# Patient Record
Sex: Female | Born: 1990 | Race: Black or African American | Hispanic: No | Marital: Single | Smoking: Never smoker
Health system: Southern US, Community
[De-identification: ages and names within clinical notes are randomized; demographics above are authoritative.]

## PROBLEM LIST (undated history)

## (undated) DIAGNOSIS — F200 Paranoid schizophrenia: Secondary | ICD-10-CM

## (undated) DIAGNOSIS — F79 Unspecified intellectual disabilities: Secondary | ICD-10-CM

## (undated) DIAGNOSIS — R6250 Unspecified lack of expected normal physiological development in childhood: Secondary | ICD-10-CM

## (undated) DIAGNOSIS — R569 Unspecified convulsions: Secondary | ICD-10-CM

## (undated) HISTORY — DX: Unspecified lack of expected normal physiological development in childhood: R62.50

## (undated) HISTORY — DX: Unspecified intellectual disabilities: F79

## (undated) HISTORY — DX: Unspecified convulsions: R56.9

## (undated) HISTORY — DX: Paranoid schizophrenia: F20.0

---
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
## 2010-01-12 ENCOUNTER — Encounter: Admission: RE | Admit: 2010-01-12 | Discharge: 2010-04-12 | Payer: Self-pay | Admitting: *Deleted

## 2010-01-25 ENCOUNTER — Ambulatory Visit: Payer: Self-pay | Admitting: Family Medicine

## 2010-02-14 ENCOUNTER — Emergency Department: Payer: Self-pay | Admitting: Emergency Medicine

## 2010-02-17 ENCOUNTER — Emergency Department: Payer: Self-pay | Admitting: Emergency Medicine

## 2010-03-16 ENCOUNTER — Encounter: Payer: Self-pay | Admitting: Gastroenterology

## 2010-08-30 NOTE — Letter (Signed)
Summary: New Patient letter  [facility]  [address] [place], [place] [address]   Phone: [phone]  Fax: [phone]       03/16/2010 MRN: [id number]  Hannah Keller [address] [place], [place]  [address]  Dear Ms. Faraone,  Welcome to the Gastroenterology Division at [facility].    You are scheduled to see Dr.  Christella Hartigan on 03-29-10 at 2pm on the 3rd floor at [facility], [address]  We ask that you try to arrive at our office 15 minutes prior to your appointment time to allow for check-in.  We would like you to complete the enclosed self-administered evaluation form prior to your visit and bring it with you on the day of your appointment.  We will review it with you.  Also, please bring a complete list of all your medications or, if you prefer, bring the medication bottles and we will list them.  Please bring your insurance card so that we may make a copy of it.  If your insurance requires a referral to see a specialist, please bring your referral form from your primary care physician.  Co-payments are due at the time of your visit and may be paid by cash, check or credit card.     Your office visit will consist of a consult with your physician (includes a physical exam), any laboratory testing he/she may order, scheduling of any necessary diagnostic testing (e.g. x-ray, ultrasound, CT-scan), and scheduling of a procedure (e.g. Endoscopy, Colonoscopy) if required.  Please allow enough time on your schedule to allow for any/all of these possibilities.    If you cannot keep your appointment, please call [phone] to cancel or reschedule prior to your appointment date.  This allows [place] the opportunity to schedule an appointment for another patient in need of care.  If you do not cancel or reschedule by 5 p.m. the business day prior to your appointment date, you will be charged a $50.00 late cancellation/no-show fee.    Thank you for choosing [facility] for your medical needs.  We appreciate the opportunity to care for you.  Please visit [place] at our website  to learn more about our practice.                     Sincerely,                                                             The Gastroenterology Division

## 2011-11-27 ENCOUNTER — Emergency Department: Payer: Self-pay | Admitting: *Deleted

## 2012-02-23 ENCOUNTER — Emergency Department: Payer: Self-pay | Admitting: Emergency Medicine

## 2012-02-24 LAB — PREGNANCY, URINE: Pregnancy Test, Urine: NEGATIVE m[IU]/mL

## 2012-02-24 LAB — ETHANOL: Ethanol: <3 mg/dL

## 2012-02-24 LAB — CBC
HCT: 38.5 % (ref 35.0–47.0)
HGB: 13 g/dL (ref 12.0–16.0)
MCH: 30.3 pg (ref 26.0–34.0)
MCHC: 33.8 g/dL (ref 32.0–36.0)
MCV: 90 fL (ref 80–100)
Platelet: 242 10*3/uL (ref 150–440)
RDW: 12.6 % (ref 11.5–14.5)

## 2012-02-24 LAB — URINALYSIS, COMPLETE
Bilirubin,UR: NEGATIVE
Ketone: NEGATIVE
Ph: 7 (ref 4.5–8.0)
Protein: NEGATIVE
RBC,UR: NONE SEEN /HPF (ref 0–5)
Specific Gravity: 1.021 (ref 1.003–1.030)
Squamous Epithelial: 8
WBC UR: 14 /HPF (ref 0–5)

## 2012-02-24 LAB — COMPREHENSIVE METABOLIC PANEL
Albumin: 3.4 g/dL (ref 3.4–5.0)
Alkaline Phosphatase: 63 U/L (ref 50–136)
Anion Gap: 7 (ref 7–16)
Bilirubin,Total: 0.2 mg/dL (ref 0.2–1.0)
Calcium, Total: 9.3 mg/dL (ref 8.5–10.1)
Chloride: 108 mmol/L — ABNORMAL HIGH (ref 98–107)
Co2: 27 mmol/L (ref 21–32)
EGFR (African American): >60
EGFR (Non-African Amer.): >60
Glucose: 88 mg/dL (ref 65–99)
Osmolality: 284 (ref 275–301)
Potassium: 4.1 mmol/L (ref 3.5–5.1)
Sodium: 142 mmol/L (ref 136–145)
Total Protein: 7.6 g/dL (ref 6.4–8.2)

## 2012-02-24 LAB — TSH
Thyroid Stimulating Horm: 1.6 u[IU]/mL
Thyroid Stimulating Horm: 6.29 u[IU]/mL — ABNORMAL HIGH

## 2012-02-24 LAB — DRUG SCREEN, URINE
Amphetamines, Ur Screen: POSITIVE (ref ?–1000)
Benzodiazepine, Ur Scrn: NEGATIVE (ref ?–200)
Cocaine Metabolite,Ur ~~LOC~~: NEGATIVE (ref ?–300)
Methadone, Ur Screen: NEGATIVE (ref ?–300)
Phencyclidine (PCP) Ur S: NEGATIVE (ref ?–25)

## 2012-02-24 LAB — LITHIUM LEVEL: Lithium: 0.71 mmol/L

## 2012-03-23 ENCOUNTER — Emergency Department: Payer: Self-pay | Admitting: Emergency Medicine

## 2012-03-23 LAB — COMPREHENSIVE METABOLIC PANEL
Alkaline Phosphatase: 64 U/L (ref 50–136)
Anion Gap: 6 — ABNORMAL LOW (ref 7–16)
Bilirubin,Total: 0.1 mg/dL — ABNORMAL LOW (ref 0.2–1.0)
Calcium, Total: 9.1 mg/dL (ref 8.5–10.1)
Chloride: 107 mmol/L (ref 98–107)
Co2: 25 mmol/L (ref 21–32)
Creatinine: 1.16 mg/dL (ref 0.60–1.30)
EGFR (African American): >60
EGFR (Non-African Amer.): >60
Osmolality: 278 (ref 275–301)
Potassium: 4.3 mmol/L (ref 3.5–5.1)
Total Protein: 7.6 g/dL (ref 6.4–8.2)

## 2012-03-23 LAB — LITHIUM LEVEL: Lithium: 0.88 mmol/L

## 2012-03-23 LAB — URINALYSIS, COMPLETE
Bilirubin,UR: NEGATIVE
Blood: NEGATIVE
Glucose,UR: NEGATIVE mg/dL (ref 0–75)
Leukocyte Esterase: NEGATIVE
Nitrite: NEGATIVE
RBC,UR: NONE SEEN /HPF (ref 0–5)
Squamous Epithelial: 3
WBC UR: 2 /HPF (ref 0–5)

## 2012-03-23 LAB — DRUG SCREEN, URINE
Barbiturates, Ur Screen: NEGATIVE (ref ?–200)
Benzodiazepine, Ur Scrn: NEGATIVE (ref ?–200)
Cannabinoid 50 Ng, Ur ~~LOC~~: NEGATIVE (ref ?–50)
MDMA (Ecstasy)Ur Screen: NEGATIVE (ref ?–500)
Phencyclidine (PCP) Ur S: NEGATIVE (ref ?–25)

## 2012-03-23 LAB — CBC
HCT: 38.7 % (ref 35.0–47.0)
HGB: 12.9 g/dL (ref 12.0–16.0)
MCH: 30.4 pg (ref 26.0–34.0)
MCV: 91 fL (ref 80–100)
RDW: 13 % (ref 11.5–14.5)
WBC: 10.7 10*3/uL (ref 3.6–11.0)

## 2012-03-23 LAB — PREGNANCY, URINE: Pregnancy Test, Urine: NEGATIVE m[IU]/mL

## 2012-03-23 LAB — ETHANOL
Ethanol %: <0.003 % (ref 0.000–0.080)
Ethanol: <3 mg/dL

## 2012-03-23 LAB — TSH: Thyroid Stimulating Horm: 2.09 u[IU]/mL

## 2012-12-09 ENCOUNTER — Ambulatory Visit (INDEPENDENT_AMBULATORY_CARE_PROVIDER_SITE_OTHER): Payer: Medicaid Other | Admitting: Diagnostic Neuroimaging

## 2012-12-09 ENCOUNTER — Encounter: Payer: Self-pay | Admitting: Diagnostic Neuroimaging

## 2012-12-09 VITALS — BP 108/62 | HR 98 | Temp 98.5°F | Ht <= 58 in | Wt 162.0 lb

## 2012-12-09 DIAGNOSIS — IMO0002 Reserved for concepts with insufficient information to code with codable children: Secondary | ICD-10-CM

## 2012-12-09 DIAGNOSIS — R451 Restlessness and agitation: Secondary | ICD-10-CM

## 2012-12-09 NOTE — Patient Instructions (Addendum)
We will order MRI of the brain and an EEG and send recommendations for lab tests to Primary Provider.

## 2012-12-09 NOTE — Progress Notes (Signed)
[facility]  PATIENT: Hannah Keller DOB: 02-Jun-1991   HISTORY FROM: caregivers, patient REASON FOR VISIT: increased behavioral problems   HISTORICAL  CHIEF COMPLAINT:  Chief Complaint  Patient presents with  . Neurologic Problem    severe behavior changes    HISTORY OF PRESENT ILLNESS:    UPDATE 12/09/12: 22 year old female with history of moderate mental retardation, bipolar disorder, paranoid schizophrenia and seizure disorder comes in today accompanied by 2 caregivers with reports of severe behavioral changes and aggression .  Caregivers report severe behavioral problems which have caused her to be recently suspended from school.  She has had medication changes for increased aggression by psychiatrist (Dr. Omelia Blackwater) but caregivers report it is not helping.  Caregivers want to know if there is a neurologic reason why she is having these behavioral episodes.  PRIOR HPI (02/22/10): 21 year old right-handed female with history of moderate mental retardation, static encephalopathy, bipolar disorder presenting for evaluation of decreased p.o. intake and chewing/swallowing difficulties.  She is accompanied by 2 caregivers for this visit.  Patient currently lives in a group home and was doing well until approximately one year ago. She previously had a strong appetite and was slightly overweight. She previously weight 144 pounds; now she weighs 93 pounds.  Weight loss attributed mainly to refusal to eat and decreased p.o. intake. In November 2010, school officials reported that she was pocketing food in her mouth, not chewing and swallowing her food properly.  She has been evaluated by speech therapy and had recent modified barium swallow study which did not demonstrate any pharyngeal dysphasia or aspiration.  The caregivers are concerned that this is mainly related to behavioral issues. They suspect attention seeking behavior.  One of the caregivers commented "it seems as  though she is trying to starve herself".   REVIEW OF SYSTEMS: Full 14 system review of systems performed and notable only for weight loss anxiety hallucination racing thoughts.  ALLERGIES: Not on File  HOME MEDICATIONS: No outpatient prescriptions prior to visit.   No facility-administered medications prior to visit.    PAST MEDICAL HISTORY: Past Medical History  Diagnosis Date  . Mental retardation     Mild  . Paranoid schizophrenia     PAST SURGICAL HISTORY: History reviewed. No pertinent past surgical history.  FAMILY HISTORY: History reviewed. No pertinent family history.  SOCIAL HISTORY:  History   Social History  . Marital Status: Single    Spouse Name: N/A    Number of Children: 0  . Years of Education: n/a   Occupational History  . Disabled    Social History Main Topics  . Smoking status: Never Smoker   . Smokeless tobacco: Never Used  . Alcohol Use: No  . Drug Use: No  . Sexually Active: Not on file   Other Topics Concern  . Not on file   Social History Narrative   Pt currently resides at [facility].   Caffeine Use: None     PHYSICAL EXAM   Filed Vitals:   12/09/12 1111  BP: 108/62  Pulse: 98  Temp: 98.5 F (36.9 C)  Height: 4\' 9"  (1.448 m)  Weight: 162 lb (73.483 kg)   Body mass index is 35.05 kg/(m^2).  EXAM: General: Patient is awake, alert and in no acute distress.  HEAD DOWN; AVOIDING EYE CONTACT; SHY; WITHDRAWN. Head: HEAD TURNED TO THE RIGHT AND DOWN.  ABLE TO STRAIGHTEN UPRIGHT AT VERBAL REQUEST OF CAREGIVERS. Neck: INCREASED TONE TO THE  RIGHT. Cardiovascular: Heart is regular rate and rhythm with no murmurs.  Neurologic Exam  Mental Status: Awake, alert; SPEAKS IN SOFT VOICE; ANSWERS IN SHORT PHRASES OR 1 WORD WORD ANSWERS.  PAUCITY OF SPEECH. Cranial Nerves: Pupils are equal and reactive to light.  SUSTAINED NYSTAGMUS, WORSE ON RIGHT GAZE, BUT PRESENT IN ALL DIRECTIONS.  Facial sensation and strength are  symmetric.  Hearing is intact.  Palate elevated symmetrically and uvula is midline.  Shoulder shug is symmetric.  Tongue is midline; SLOW TONGUE PROTRUSION. Motor: INCREASED TONE IN RIGHT COMPARED TO LEFT SIDE. Sensory: Intact and symmetric to light touch. Coordination: TRUNCAL ATAXIA; LEANING TO RIGHT.  SLOW MOVEMENTS WITH ARMS AND LEGS. Gait and Station: ABLE TO STAND ON HER OWN AND WALK; SLOW; LEANS TO THE RIGHT. Reflexes: Deep tendon reflexes in the upper and lower extremity are present; INCREASED ON RIGHT SIDE (RIGHT ARM 3-4+); SUSTAINED CLONUS ON RIGHT ANKLE.   DIAGNOSTIC DATA (LABS, IMAGING, TESTING) - I reviewed patient records, labs, notes, testing and imaging myself where available.  No results found for this basename: WBC,  HGB,  HCT,  MCV,  PLT   No results found for this basename: na,  k,  cl,  co2,  glucose,  bun,  creatinine,  calcium,  prot,  albumin,  ast,  alt,  alkphos,  bilitot,  gfrnonaa,  gfraa   No results found for this basename: CHOL,  HDL,  LDLCALC,  LDLDIRECT,  TRIG,  CHOLHDL   No results found for this basename: HGBA1C   No results found for this basename: VITAMINB12   No results found for this basename: TSH     ASSESSMENT AND PLAN  22 y.o. year old female  has a past medical history of Mental retardation and Paranoid schizophrenia. here with behavioral changes.   PLAN: 1. I will check MRI, EEG.  2. Ask PCP to check B12, TSH, depakote level, lithium level and ammonia level.   Suanne Marker, MD(with LYNN LAM NP-C 12/09/2012, 11:20 AM) Certified in Neurology, Neurophysiology and Neuroimaging  [facility] [address] [place], [place] [address] [phone]

## 2012-12-12 ENCOUNTER — Ambulatory Visit (INDEPENDENT_AMBULATORY_CARE_PROVIDER_SITE_OTHER): Payer: Medicaid Other | Admitting: Radiology

## 2012-12-12 DIAGNOSIS — IMO0002 Reserved for concepts with insufficient information to code with codable children: Secondary | ICD-10-CM

## 2012-12-12 DIAGNOSIS — R451 Restlessness and agitation: Secondary | ICD-10-CM

## 2012-12-16 NOTE — Procedures (Signed)
   [facility]  EEG (ELECTROENCEPHALOGRAM) REPORT   STUDY DATE: 12/12/12 PATIENT NAME: Hannah Keller DOB: [birth year] MRN: [id number]  ORDERING CLINICIAN: Joycelyn Schmid, MD   TECHNOLOGIST: Kaylyn Lim TECHNIQUE: Electroencephalogram was recorded utilizing standard 10-20 system of lead placement and reformatted into average and bipolar montages.  RECORDING TIME: 20 minutes ACTIVATION: photic stimulation  CLINICAL INFORMATION: 22 year old female with behavioral changes. Mental retardation. Schizophrenia.  FINDINGS: Background rhythms of 9-10 hertz and 30-40 microvolts. There is intermittent, frontal, rhythmic delta activity (FIRDA). No focal, lateralizing, epileptiform activity or seizures are seen. Patient recorded in the awake state.    IMPRESSION:  Abnormal EEG in the awake state demonstrating: 1. There is intermittent, frontal, rhythmic delta activity (FIRDA). Can be due to toxic, metabolic or structural abnormalities. This finding is not necessarily epileptiform in nature. 2. No electrographic seizures.   INTERPRETING PHYSICIAN:  Suanne Marker, MD Certified in Neurology, Neurophysiology and Neuroimaging  [facility] [address] [place], [place] [address] [phone]

## 2012-12-27 ENCOUNTER — Inpatient Hospital Stay: Admission: RE | Admit: 2012-12-27 | Payer: Self-pay | Source: Ambulatory Visit

## 2012-12-31 ENCOUNTER — Ambulatory Visit
Admission: RE | Admit: 2012-12-31 | Discharge: 2012-12-31 | Disposition: A | Discharge status: Home / Self Care | Payer: Medicaid Other | Source: Ambulatory Visit | Attending: Nurse Practitioner | Admitting: Nurse Practitioner

## 2012-12-31 DIAGNOSIS — R451 Restlessness and agitation: Secondary | ICD-10-CM

## 2013-01-01 DIAGNOSIS — IMO0002 Reserved for concepts with insufficient information to code with codable children: Secondary | ICD-10-CM

## 2013-01-06 NOTE — Progress Notes (Signed)
Quick Note:  Called and spoke to caregiver normal MRI results. ______

## 2014-06-17 ENCOUNTER — Encounter (HOSPITAL_COMMUNITY): Payer: Self-pay | Admitting: *Deleted

## 2014-06-17 ENCOUNTER — Emergency Department (HOSPITAL_COMMUNITY)
Admission: EM | Admit: 2014-06-17 | Discharge: 2014-06-17 | Disposition: A | Discharge status: Home / Self Care | Payer: Medicaid Other | Attending: Emergency Medicine | Admitting: Emergency Medicine

## 2014-06-17 DIAGNOSIS — W228XXA Striking against or struck by other objects, initial encounter: Secondary | ICD-10-CM | POA: Diagnosis not present

## 2014-06-17 DIAGNOSIS — Y9389 Activity, other specified: Secondary | ICD-10-CM | POA: Insufficient documentation

## 2014-06-17 DIAGNOSIS — F200 Paranoid schizophrenia: Secondary | ICD-10-CM | POA: Diagnosis not present

## 2014-06-17 DIAGNOSIS — Y998 Other external cause status: Secondary | ICD-10-CM | POA: Insufficient documentation

## 2014-06-17 DIAGNOSIS — Y92210 Daycare center as the place of occurrence of the external cause: Secondary | ICD-10-CM | POA: Insufficient documentation

## 2014-06-17 DIAGNOSIS — S61212A Laceration without foreign body of right middle finger without damage to nail, initial encounter: Secondary | ICD-10-CM | POA: Diagnosis not present

## 2014-06-17 DIAGNOSIS — Z79899 Other long term (current) drug therapy: Secondary | ICD-10-CM | POA: Insufficient documentation

## 2014-06-17 DIAGNOSIS — IMO0002 Reserved for concepts with insufficient information to code with codable children: Secondary | ICD-10-CM

## 2014-06-17 DIAGNOSIS — G40909 Epilepsy, unspecified, not intractable, without status epilepticus: Secondary | ICD-10-CM | POA: Diagnosis not present

## 2014-06-17 MED ORDER — LIDOCAINE HCL (PF) 1 % IJ SOLN
5.0000 mL | Freq: Once | INTRAMUSCULAR | Status: DC
  Filled 2014-06-17: qty 5

## 2014-06-17 MED ORDER — ACETAMINOPHEN 325 MG PO TABS: 325.0000 mg | ORAL_TABLET | Freq: Four times a day (QID) | ORAL | Status: DC | PRN

## 2014-06-17 NOTE — ED Notes (Signed)
Called pt's caregiver at number in pt's chart, no answer, left voice mail. PA updated

## 2014-06-17 NOTE — ED Notes (Signed)
Per EMS [facility] has hx of behavioral disorder and MR, she got mad at somebody and punched metal blinds injuring right hand (middle finger laceration), per EMS bleeding was controlled with bandaid prior to their arrival.

## 2014-06-17 NOTE — Progress Notes (Signed)
  CARE MANAGEMENT ED NOTE 06/17/2014  Patient:  Hannah Keller,Hannah Keller   Account Number:  [id number]  Date Initiated:  06/17/2014  Documentation initiated by:  Radford PaxFERRERO,Vikram Tillett  Subjective/Objective Assessment:   Patient presents to Ed with finger laceratoin.     Subjective/Objective Assessment Detail:   [facility]. Patient with behaviorl disorder, MR     Action/Plan:   Action/Plan Detail:   Anticipated DC Date:  06/17/2014     Status Recommendation to Physician:   Result of Recommendation:    Other ED Services  Consult Working Plan    DC Planning Services  Other  PCP issues    Choice offered to / List presented to:            Status of service:  Completed, signed off  ED Comments:   ED Comments Detail:  Patient listed as having [employer]  Access insurnace.  PCP listed on patient's card [facility].  System updated.

## 2014-06-17 NOTE — Discharge Instructions (Signed)
A laceration is a cut or lesion that goes through all layers of the skin and into the tissue just beneath the skin. This may have been repaired by your caregiver.  SEEK MEDICAL ATTENTION IF: There is redness, swelling, increasing pain in the wound  There is a red line that goes up your arm or leg.  Pus is coming from wound.  You develop an unexplained temperature above 100.4.  You notice a foul smell coming from the wound or dressing.  There is a breaking open of the wound (edges not staying together) after sutures have been removed. If you did not receive a tetanus shot today because you thought you were up to date, but did not recall when your last one was given, make sure to check with your primary caregiver to determine if you need one.  WOUND CARE Please have your stitches/staples removed in 7 days or sooner if you have concerns. You may do this at any available urgent care or at your primary care doctor's office.  Keep area clean and dry for 24 hours. Do not remove bandage, if applied.  After 24 hours, remove bandage and wash wound gently with mild soap and warm water. Reapply a new bandage after cleaning wound, if directed.  Continue daily cleansing with soap and water until stitches/staples are removed.  Do not apply any ointments or creams to the wound while stitches/staples are in place, as this may cause delayed healing.  Seek medical careif you experience any of the following signs of infection: Swelling, redness, pus drainage, streaking, fever >101.0 F  Seek care if you experience excessive bleeding that does not stop after 15-20 minutes of constant, firm pressure.    Laceration Care, Adult A laceration is a cut or lesion that goes through all layers of the skin and into the tissue just beneath the skin. TREATMENT  Some lacerations may not require closure. Some lacerations may not be able to be closed due to an increased risk of infection. It is important to see  your caregiver as soon as possible after an injury to minimize the risk of infection and maximize the opportunity for successful closure. If closure is appropriate, pain medicines may be given, if needed. The wound will be cleaned to help prevent infection. Your caregiver will use stitches (sutures), staples, wound glue (adhesive), or skin adhesive strips to repair the laceration. These tools bring the skin edges together to allow for faster healing and a better cosmetic outcome. However, all wounds will heal with a scar. Once the wound has healed, scarring can be minimized by covering the wound with sunscreen during the day for 1 full year. HOME CARE INSTRUCTIONS  For sutures or staples:  Keep the wound clean and dry.  If you were given a bandage (dressing), you should change it at least once a day. Also, change the dressing if it becomes wet or dirty, or as directed by your caregiver.  Wash the wound with soap and water 2 times a day. Rinse the wound off with water to remove all soap. Pat the wound dry with a clean towel.  After cleaning, apply a thin layer of the antibiotic ointment as recommended by your caregiver. This will help prevent infection and keep the dressing from sticking.  You may shower as usual after the first 24 hours. Do not soak the wound in water until the sutures are removed.  Only take over-the-counter or prescription medicines for pain, discomfort, or fever as directed by your  caregiver.  Get your sutures or staples removed as directed by your caregiver. For skin adhesive strips:  Keep the wound clean and dry.  Do not get the skin adhesive strips wet. You may bathe carefully, using caution to keep the wound dry.  If the wound gets wet, pat it dry with a clean towel.  Skin adhesive strips will fall off on their own. You may trim the strips as the wound heals. Do not remove skin adhesive strips that are still stuck to the wound. They will fall off in time. For wound  adhesive:  You may briefly wet your wound in the shower or bath. Do not soak or scrub the wound. Do not swim. Avoid periods of heavy perspiration until the skin adhesive has fallen off on its own. After showering or bathing, gently pat the wound dry with a clean towel.  Do not apply liquid medicine, cream medicine, or ointment medicine to your wound while the skin adhesive is in place. This may loosen the film before your wound is healed.  If a dressing is placed over the wound, be careful not to apply tape directly over the skin adhesive. This may cause the adhesive to be pulled off before the wound is healed.  Avoid prolonged exposure to sunlight or tanning lamps while the skin adhesive is in place. Exposure to ultraviolet light in the first year will darken the scar.  The skin adhesive will usually remain in place for 5 to 10 days, then naturally fall off the skin. Do not pick at the adhesive film. You may need a tetanus shot if:  You cannot remember when you had your last tetanus shot.  You have never had a tetanus shot. If you get a tetanus shot, your arm may swell, get red, and feel warm to the touch. This is common and not a problem. If you need a tetanus shot and you choose not to have one, there is a rare chance of getting tetanus. Sickness from tetanus can be serious. SEEK MEDICAL CARE IF:   You have redness, swelling, or increasing pain in the wound.  You see a red line that goes away from the wound.  You have yellowish-white fluid (pus) coming from the wound.  You have a fever.  You notice a bad smell coming from the wound or dressing.  Your wound breaks open before or after sutures have been removed.  You notice something coming out of the wound such as wood or glass.  Your wound is on your hand or foot and you cannot move a finger or toe. SEEK IMMEDIATE MEDICAL CARE IF:   Your pain is not controlled with prescribed medicine.  You have severe swelling around the  wound causing pain and numbness or a change in color in your arm, hand, leg, or foot.  Your wound splits open and starts bleeding.  You have worsening numbness, weakness, or loss of function of any joint around or beyond the wound.  You develop painful lumps near the wound or on the skin anywhere on your body. MAKE SURE YOU:   Understand these instructions.  Will watch your condition.  Will get help right away if you are not doing well or get worse. Document Released: 07/17/2005 Document Revised: 10/09/2011 Document Reviewed: 01/10/2011 [facility] Patient Information 2015 [place], [place]. This information is not intended to replace advice given to you by your health care provider. Make sure you discuss any questions you have with your health care provider.

## 2014-06-17 NOTE — ED Provider Notes (Signed)
CSN: [id number]     Arrival date & time 06/17/14  1535 History   First MD Initiated Contact with Patient 06/17/14 1544     Chief Complaint  Patient presents with  . finger lac      (Consider location/radiation/quality/duration/timing/severity/associated sxs/prior Treatment) HPI  This is a 23 year old female with mental retardation and paranoid schizophrenia brought in by EMS for laceration to the finger. Patient sustained a 2 cm laceration to the right middle finger just prior to arrival. According to group home manager. The patient grabbed a blind and pulled her hand back, which caused the slice on her finger. Patient is up-to-date on her tetanus vaccination in 2011. She denies any weakness, numbness or tingling.  Past Medical History  Diagnosis Date  . Mental retardation     Mild  . Paranoid schizophrenia   . Seizures   . Developmental delay    History reviewed. No pertinent past surgical history. No family history on file. History  Substance Use Topics  . Smoking status: Never Smoker   . Smokeless tobacco: Never Used  . Alcohol Use: No   OB History    No data available     Review of Systems  Constitutional: Negative for fever and chills.  Skin: Positive for wound.  Neurological: Negative for weakness and numbness.      Allergies  Ritalin  Home Medications   Prior to Admission medications   Medication Sig Start Date End Date Taking? Authorizing Provider  ARIPiprazole (ABILIFY MAINTENA) 400 MG SUSR Inject into the muscle every 30 (thirty) days.   Yes Historical Provider, MD  Asenapine Maleate (SAPHRIS) 10 MG SUBL Place 1 each under the tongue 3 (three) times daily.   Yes Historical Provider, MD  benztropine (COGENTIN) 0.5 MG tablet Take 0.5 mg by mouth at bedtime.   Yes Historical Provider, MD  cholecalciferol (VITAMIN D) 1000 UNITS tablet Take 1,000 Units by mouth 2 (two) times daily.   Yes Historical Provider, MD  clonazePAM (KLONOPIN) 0.5 MG tablet Take 0.5 mg  by mouth. One tablet 30 minutes prior to community outing.   Yes Historical Provider, MD  clonazePAM (KLONOPIN) 1 MG tablet Take 1 mg by mouth 3 (three) times daily.    Yes Historical Provider, MD  cloNIDine (CATAPRES - DOSED IN MG/24 HR) 0.3 mg/24hr patch Place 0.3 mg onto the skin once a week.   Yes Historical Provider, MD  divalproex (DEPAKOTE SPRINKLE) 125 MG capsule Take 750 mg by mouth at bedtime.   Yes Historical Provider, MD  divalproex (DEPAKOTE) 125 MG DR tablet Take 375 mg by mouth every morning. 6 capsules @@ hs; 3 capsules in a.m.   Yes Historical Provider, MD  docusate sodium (COLACE) 100 MG capsule Take 200 mg by mouth daily.    Yes Historical Provider, MD  haloperidol lactate (HALDOL) 5 MG/ML injection Inject 5 mg into the skin as needed (agitation/behavior problems).   Yes Historical Provider, MD  lithium carbonate (LITHOBID) 300 MG CR tablet Take 300 mg by mouth every morning.    Yes Historical Provider, MD  lithium carbonate (LITHOBID) 300 MG CR tablet Take 600 mg by mouth at bedtime.   Yes Historical Provider, MD  medroxyPROGESTERone (DEPO-PROVERA) 150 MG/ML injection Inject 150 mg into the muscle every 3 (three) months.   Yes Historical Provider, MD  Olopatadine HCl (PATADAY) 0.2 % SOLN Apply 1 drop to eye daily as needed.   Yes Historical Provider, MD  PARoxetine (PAXIL) 20 MG tablet Take 20 mg by mouth  daily.   Yes Historical Provider, MD  polyethylene glycol (MIRALAX / GLYCOLAX) packet Take 17 g by mouth daily.   Yes Historical Provider, MD  QUEtiapine (SEROQUEL) 50 MG tablet Take 50 mg by mouth 3 (three) times daily.   Yes Historical Provider, MD  solifenacin (VESICARE) 10 MG tablet Take 10 mg by mouth daily.   Yes Historical Provider, MD  valACYclovir (VALTREX) 1000 MG tablet Take 1,000 mg by mouth 2 (two) times daily as needed (for one day with outbreak fever blister).   Yes Historical Provider, MD   BP 117/51 mmHg  Pulse 88  Temp(Src) 98.8 F (37.1 C) (Oral)  Resp 20   SpO2 100% Physical Exam  Constitutional: She is oriented to person, place, and time. She appears well-developed and well-nourished. No distress.  HENT:  Head: Normocephalic and atraumatic.  Nystagmus   Eyes: Conjunctivae are normal. No scleral icterus.  Neck: Normal range of motion.  Cardiovascular: Normal rate, regular rhythm and normal heart sounds.  Exam reveals no gallop and no friction rub.   No murmur heard. Pulmonary/Chest: Effort normal and breath sounds normal. No respiratory distress.  Abdominal: Soft. Bowel sounds are normal. She exhibits no distension and no mass. There is no tenderness. There is no guarding.  Neurological: She is alert and oriented to person, place, and time.  Skin: Skin is warm and dry. She is not diaphoretic.  2 cm flap -like laceration of the right middle finger. Full strength and range of motion, neurovascularly intact.    ED Course  Procedures (including critical care time) Labs Review Labs Reviewed - No data to display  Imaging Review No results found.   EKG Interpretation None     LACERATION REPAIR Performed by: Arthor CaptainHarris, Hewitt Garner Authorized by: Arthor CaptainHarris, Nilda Keathley Consent: Verbal consent obtained. Risks and benefits: risks, benefits and alternatives were discussed Consent given by: patient Patient identity confirmed: provided demographic data Prepped and Draped in normal sterile fashion Wound explored  Laceration Location: Right middle finger  Laceration Length: 2 cm  No Foreign Bodies seen or palpated  Anesthesia: local infiltration  Local anesthetic: lidocaine 2% without epinephrine  Anesthetic total: 2 ml  Irrigation method: syringe Amount of cleaning: standard  Skin closure: 5-0 Prolene   Number of sutures: 4   Technique: Simple interrupted   Patient tolerance: Patient tolerated the procedure well with no immediate complications.  MDM   Final diagnoses:  Laceration    7:55 PM BP 126/67 mmHg  Pulse 107   Temp(Src) 98.8 F (37.1 C) (Oral)  Resp 18  SpO2 100% Tdap booster given.Pressure irrigation performed. Laceration occurred < 8 hours prior to repair which was well tolerated. Pt has no co morbidities to effect normal wound healing. Discussed suture home care w pt and answered questions. Pt to f-u for wound check and suture removal in 7 days. Pt is hemodynamically stable w no complaints prior to dc.       Arthor CaptainAbigail Dariella Gillihan, PA-C 06/17/14 1956  Ward GivensIva L Knapp, MD 06/17/14 [phone]

## 2014-06-17 NOTE — ED Notes (Signed)
Pt's caregiver called sts she wasn't aware pt was in the hospital until 15 minutes ago and she will be here by 6:00 o'clock.

## 2014-06-17 NOTE — ED Notes (Signed)
Bed: [id number] Expected date:  Expected time:  Means of arrival:  Comments: EMS finger lac.
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
## 2014-11-17 NOTE — Consult Note (Signed)
Brief Consult Note: Diagnosis: Bipolar disorder, MR.   Patient was seen by consultant.   Consult note dictated.   Recommend further assessment or treatment.   Orders entered.   Discussed with Attending MD.   Comments: [name] has a h/o developmental disorder and bipolar illness. She was brought to the hospital for agitated behavior after the group home was unable to provide the necessary medication as they "run out".   She was restarted on Saphris in the ER. No behavioral problems here.   PLAN: 1. The patient no longer meets criteria for IVC. I will termionate proceedings. Please discharge as appropriate.   2. I will provide Rx for Saphris.   3. She is to continue all other medications as prescribed by her psychiatrist and PCP.   4. She will follow up wirth Dr. Omelia Keller, her primary psychiatrist next week.  5. Group home owner will pick her up from ER.Marland Kitchen.  Electronic Signatures: Hannah Keller, Hannah Keller (MD)  (Signed 25-Aug-13 11:46)  Authored: Brief Consult Note   Last Updated: 25-Aug-13 11:46 by Hannah Keller, Hannah Keller (MD)

## 2014-11-30 ENCOUNTER — Emergency Department
Admission: EM | Admit: 2014-11-30 | Discharge: 2014-12-01 | Disposition: A | Discharge status: Home / Self Care | Payer: Medicaid Other | Attending: Emergency Medicine | Admitting: Emergency Medicine

## 2014-11-30 DIAGNOSIS — Z046 Encounter for general psychiatric examination, requested by authority: Secondary | ICD-10-CM | POA: Diagnosis present

## 2014-11-30 DIAGNOSIS — F911 Conduct disorder, childhood-onset type: Secondary | ICD-10-CM | POA: Diagnosis not present

## 2014-11-30 DIAGNOSIS — F71 Moderate intellectual disabilities: Secondary | ICD-10-CM | POA: Insufficient documentation

## 2014-11-30 DIAGNOSIS — R4689 Other symptoms and signs involving appearance and behavior: Secondary | ICD-10-CM

## 2014-11-30 DIAGNOSIS — Z79899 Other long term (current) drug therapy: Secondary | ICD-10-CM | POA: Insufficient documentation

## 2014-11-30 LAB — ETHANOL: Alcohol, Ethyl (B): <5 mg/dL (ref <5)

## 2014-11-30 LAB — CBC
HEMATOCRIT: 37.9 % (ref 35.0–47.0)
Hemoglobin: 12.3 g/dL (ref 12.0–16.0)
MCH: 31.2 pg (ref 26.0–34.0)
MCHC: 32.5 g/dL (ref 32.0–36.0)
MCV: 95.9 fL (ref 80.0–100.0)
Platelets: 169 10*3/uL (ref 150–440)
RBC: 3.95 MIL/uL (ref 3.80–5.20)
RDW: 12.2 % (ref 11.5–14.5)
WBC: 9.3 10*3/uL (ref 3.6–11.0)

## 2014-11-30 LAB — BASIC METABOLIC PANEL
Anion gap: 5 (ref 5–15)
BUN: 12 mg/dL (ref 6–20)
CHLORIDE: 111 mmol/L (ref 101–111)
CO2: 26 mmol/L (ref 22–32)
CREATININE: 1.03 mg/dL — AB (ref 0.44–1.00)
Calcium: 9.9 mg/dL (ref 8.9–10.3)
GFR calc Af Amer: >60 mL/min (ref >60)
GFR calc non Af Amer: >60 mL/min (ref >60)
Glucose, Bld: 68 mg/dL (ref 65–99)
Potassium: 4 mmol/L (ref 3.5–5.1)
Sodium: 142 mmol/L (ref 135–145)

## 2014-11-30 LAB — POCT PREGNANCY, URINE: Preg Test, Ur: NEGATIVE
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
## 2014-11-30 NOTE — ED Notes (Signed)
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
## 2014-11-30 NOTE — ED Notes (Signed)
BEHAVIORAL HEALTH ROUNDING Patient sleeping: No. Patient alert and oriented: yes Behavior appropriate: Yes.  ; If no, describe:  Nutrition and fluids offered: Yes  Toileting and hygiene offered: Yes  Sitter present: yes Law enforcement present: Yes  

## 2014-11-30 NOTE — ED Notes (Signed)
Pt unable to void at this time.  Pt dressed out in paper scrubs.

## 2014-12-01 DIAGNOSIS — F71 Moderate intellectual disabilities: Secondary | ICD-10-CM

## 2014-12-01 LAB — URINALYSIS COMPLETE WITH MICROSCOPIC (ARMC ONLY)
BILIRUBIN URINE: NEGATIVE
GLUCOSE, UA: NEGATIVE mg/dL
Hgb urine dipstick: NEGATIVE
Ketones, ur: NEGATIVE mg/dL
LEUKOCYTES UA: NEGATIVE
Nitrite: NEGATIVE
Protein, ur: NEGATIVE mg/dL
Specific Gravity, Urine: 1.008 (ref 1.005–1.030)
pH: 8 (ref 5.0–8.0)

## 2014-12-01 LAB — URINE DRUG SCREEN, QUALITATIVE (ARMC ONLY)
AMPHETAMINES, UR SCREEN: NOT DETECTED
BENZODIAZEPINE, UR SCRN: POSITIVE — AB
Barbiturates, Ur Screen: NOT DETECTED
Cannabinoid 50 Ng, Ur ~~LOC~~: NOT DETECTED
Cocaine Metabolite,Ur ~~LOC~~: NOT DETECTED
MDMA (Ecstasy)Ur Screen: NOT DETECTED
METHADONE SCREEN, URINE: NOT DETECTED
Opiate, Ur Screen: NOT DETECTED
Phencyclidine (PCP) Ur S: NOT DETECTED
TRICYCLIC, UR SCREEN: POSITIVE — AB

## 2014-12-01 MED ORDER — NICOTINE 10 MG IN INHA: 1.0000 | RESPIRATORY_TRACT | Status: DC | PRN

## 2014-12-01 NOTE — ED Notes (Signed)

## 2014-12-01 NOTE — ED Notes (Signed)
Intake nurse at bedside.

## 2014-12-01 NOTE — ED Notes (Signed)
BEHAVIORAL HEALTH ROUNDING Patient sleeping: Yes.   Patient alert and oriented: yes Behavior appropriate: Yes.  ; If no, describe:  Nutrition and fluids offered: Yes  Toileting and hygiene offered: Yes  Sitter present: yes Law enforcement present: Yes  

## 2014-12-01 NOTE — ED Notes (Signed)
BEHAVIORAL HEALTH ROUNDING Patient sleeping: Yes.   Patient alert and oriented: sleeping Behavior appropriate:sleeping; If no, describe: sleeping Nutrition and fluids offered: sleeping Toileting and hygiene offered: sleeping Sitter present: yes Law enforcement present: Yes

## 2014-12-01 NOTE — ED Notes (Signed)
Patient assigned to appropriate care area. Patient oriented to unit/care area: Informed that, for their safety, care areas are designed for safety and monitored at all times; and visiting hours explained to patient. Patient verbalizes understanding, and verbal contract for safety obtained. 
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
## 2014-12-01 NOTE — ED Notes (Signed)
Maureen RalphsVivian [phone] manager of the group home called to check on the status of the Pt.

## 2014-12-01 NOTE — ED Notes (Signed)
BEHAVIORAL HEALTH ROUNDING Patient sleeping: Yes.   Patient alert and oriented: sleep Behavior appropriate: sleep; If no, describe:  Nutrition and fluids offered: sleep Toileting and hygiene offered: sleep Sitter present: yes Law enforcement present: Yes  

## 2014-12-01 NOTE — ED Notes (Signed)
BEHAVIORAL HEALTH ROUNDING Patient sleeping: No. Patient alert and oriented: at baseline Behavior appropriate: Yes.  ; If no, describe:  Nutrition and fluids offered: Yes  Toileting and hygiene offered: Yes  Sitter present: no Law enforcement present: Yes  and ODS

## 2014-12-01 NOTE — ED Notes (Signed)
BEHAVIORAL HEALTH ROUNDING Patient sleeping: No. Patient alert and oriented: at basline Behavior appropriate: Yes.  ; If no, describe:  Nutrition and fluids offered: Yes  Toileting and hygiene offered: Yes  Sitter present: no Law enforcement present: Yes  and ODS

## 2014-12-01 NOTE — ED Provider Notes (Signed)
-----------------------------------------   9:47 PM on 12/01/2014 -----------------------------------------  The patient has been generally calm and cooperative in the emergency department today. Dr. Toni Amendlapacs evaluated the patient and feels that she is safe for discharge back to her group home. She has outpatient resources available to her. The group home was contacted by the behavioral medicine staff and they are coming to pick her up. The patient has no acute medical needs at this time.  Hannah Roseory Ritha Sampedro, MD 12/01/14 2148

## 2014-12-01 NOTE — Discharge Instructions (Signed)
You have been seen in the Emergency Department (ED) today for a psychiatric complaint.  You have been evaluated by psychiatry and we believe you are safe to be discharged from the hospital.    Please return to the ED immediately if you have ANY thoughts of hurting yourself or anyone else, so that we may help you.  Please avoid alcohol and drug use.  Follow up with your doctor and/or therapist as soon as possible regarding today's ED visit.   Please follow up any other recommendations and clinic appointments provided by the psychiatry team that saw you in the Emergency Department.    Aggression Physically aggressive behavior is common among small children. When frustrated or angry, toddlers may act out. Often, they will push, bite, or hit. Most children show less physical aggression as they grow up. Their language and interpersonal skills improve, too. But continued aggressive behavior is a sign of a problem. This behavior can lead to aggression and delinquency in adolescence and adulthood. Aggressive behavior can be psychological or physical. Forms of psychological aggression include threatening or bullying others. Forms of physical aggression include:  Pushing.  Hitting.  Slapping.  Kicking.  Stabbing.  Shooting.  Raping. PREVENTION  Encouraging the following behaviors can help manage aggression:  Respecting others and valuing differences.  Participating in school and community functions, including sports, music, after-school programs, community groups, and volunteer work.  Talking with an adult when they are sad, depressed, fearful, anxious, or angry. Discussions with a parent or other family member, Veterinary surgeoncounselor, Runner, broadcasting/film/videoteacher, or coach can help.  Avoiding alcohol and drug use.  Dealing with disagreements without aggression, such as conflict resolution. To learn this, children need parents and caregivers to model respectful communication and problem solving.  Limiting exposure to  aggression and violence, such as video games that are not age appropriate, violence in the media, or domestic violence. Document Released: 05/14/2007 Document Revised: 10/09/2011 Document Reviewed: 09/22/2010 [facility] Patient Information 2015 [place] [place]. This information is not intended to replace advice given to you by your health care provider. Make sure you discuss any questions you have with your health care provider.

## 2014-12-01 NOTE — ED Notes (Signed)
BEHAVIORAL HEALTH ROUNDING Patient sleeping: Yes.   Patient alert and oriented: sleeping Behavior appropriate: sleeping; If no, describe: Nutrition and fluids offered: sleeping Toileting and hygiene offered: sleeping Sitter present: yes Law enforcement present: Yes  

## 2014-12-01 NOTE — ED Notes (Signed)

## 2014-12-01 NOTE — ED Notes (Addendum)
BEHAVIORAL HEALTH ROUNDING Patient sleeping: No. Patient alert and oriented: no Behavior appropriate: Yes.  ; If no, describe:  Nutrition and fluids offered: Yes  Toileting and hygiene offered: Yes  Sitter present: yes Law enforcement present: Yes  

## 2014-12-01 NOTE — ED Notes (Signed)
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
## 2014-12-01 NOTE — ED Notes (Signed)
BEHAVIORAL HEALTH ROUNDING Patient sleeping: No. Patient alert and oriented: no Behavior appropriate: Yes.  ; If no, describe:  Nutrition and fluids offered: Yes  Toileting and hygiene offered: Yes  Sitter present: yes Law enforcement present: Yes  

## 2014-12-01 NOTE — ED Notes (Signed)
BEHAVIORAL HEALTH ROUNDING Patient sleeping: Yes.   Patient alert and oriented:no Behavior appropriate: Yes.  ; If no, describe:  Nutrition and fluids offered: Yes  Toileting and hygiene offered: Yes  Sitter present: yes Law enforcement present: Yes  

## 2014-12-01 NOTE — ED Notes (Signed)
Pt sitting on stretcher, occasionally coloring. Pt is awake and alert, at her baseline. Will continue to monitor.

## 2014-12-13 ENCOUNTER — Emergency Department
Admission: EM | Admit: 2014-12-13 | Discharge: 2014-12-14 | Disposition: A | Discharge status: Home Health | Payer: Medicaid Other | Attending: Emergency Medicine | Admitting: Emergency Medicine

## 2014-12-13 ENCOUNTER — Encounter: Payer: Self-pay | Admitting: Emergency Medicine

## 2014-12-13 DIAGNOSIS — Z79899 Other long term (current) drug therapy: Secondary | ICD-10-CM | POA: Diagnosis not present

## 2014-12-13 DIAGNOSIS — R300 Dysuria: Secondary | ICD-10-CM | POA: Diagnosis not present

## 2014-12-13 DIAGNOSIS — F200 Paranoid schizophrenia: Secondary | ICD-10-CM | POA: Diagnosis not present

## 2014-12-13 DIAGNOSIS — F71 Moderate intellectual disabilities: Secondary | ICD-10-CM | POA: Insufficient documentation

## 2014-12-13 DIAGNOSIS — Z3202 Encounter for pregnancy test, result negative: Secondary | ICD-10-CM | POA: Diagnosis not present

## 2014-12-13 DIAGNOSIS — R451 Restlessness and agitation: Secondary | ICD-10-CM

## 2014-12-13 DIAGNOSIS — Z046 Encounter for general psychiatric examination, requested by authority: Secondary | ICD-10-CM | POA: Diagnosis present

## 2014-12-13 LAB — COMPREHENSIVE METABOLIC PANEL
ALK PHOS: 42 U/L (ref 38–126)
ALT: 11 U/L — ABNORMAL LOW (ref 14–54)
ANION GAP: 4 — AB (ref 5–15)
AST: 25 U/L (ref 15–41)
Albumin: 3.5 g/dL (ref 3.5–5.0)
BILIRUBIN TOTAL: 0.3 mg/dL (ref 0.3–1.2)
BUN: 15 mg/dL (ref 6–20)
CO2: 24 mmol/L (ref 22–32)
CREATININE: 1.41 mg/dL — AB (ref 0.44–1.00)
Calcium: 9.2 mg/dL (ref 8.9–10.3)
Chloride: 111 mmol/L (ref 101–111)
GFR calc non Af Amer: 52 mL/min — ABNORMAL LOW (ref >60)
Glucose, Bld: 107 mg/dL — ABNORMAL HIGH (ref 65–99)
Potassium: 3.8 mmol/L (ref 3.5–5.1)
Sodium: 139 mmol/L (ref 135–145)
TOTAL PROTEIN: 7 g/dL (ref 6.5–8.1)

## 2014-12-13 LAB — CBC
HCT: 32.5 % — ABNORMAL LOW (ref 35.0–47.0)
HEMOGLOBIN: 10.6 g/dL — AB (ref 12.0–16.0)
MCH: 31.1 pg (ref 26.0–34.0)
MCHC: 32.7 g/dL (ref 32.0–36.0)
MCV: 95 fL (ref 80.0–100.0)
Platelets: 190 10*3/uL (ref 150–440)
RBC: 3.43 MIL/uL — AB (ref 3.80–5.20)
RDW: 12.6 % (ref 11.5–14.5)
WBC: 9.3 10*3/uL (ref 3.6–11.0)
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
## 2014-12-14 LAB — URINE DRUG SCREEN, QUALITATIVE (ARMC ONLY)
AMPHETAMINES, UR SCREEN: NOT DETECTED
Amphetamines, Ur Screen: NOT DETECTED
BARBITURATES, UR SCREEN: NOT DETECTED
BENZODIAZEPINE, UR SCRN: NOT DETECTED
Barbiturates, Ur Screen: NOT DETECTED
Benzodiazepine, Ur Scrn: NOT DETECTED
COCAINE METABOLITE, UR ~~LOC~~: NOT DETECTED
COCAINE METABOLITE, UR ~~LOC~~: NOT DETECTED
Cannabinoid 50 Ng, Ur ~~LOC~~: NOT DETECTED
Cannabinoid 50 Ng, Ur ~~LOC~~: NOT DETECTED
MDMA (ECSTASY) UR SCREEN: NOT DETECTED
MDMA (ECSTASY) UR SCREEN: NOT DETECTED
METHADONE SCREEN, URINE: NOT DETECTED
Methadone Scn, Ur: NOT DETECTED
OPIATE, UR SCREEN: NOT DETECTED
Opiate, Ur Screen: NOT DETECTED
PHENCYCLIDINE (PCP) UR S: NOT DETECTED
Phencyclidine (PCP) Ur S: NOT DETECTED
Tricyclic, Ur Screen: NOT DETECTED
Tricyclic, Ur Screen: NOT DETECTED

## 2014-12-14 LAB — ACETAMINOPHEN LEVEL

## 2014-12-14 LAB — URINALYSIS COMPLETE WITH MICROSCOPIC (ARMC ONLY)
BACTERIA UA: NONE SEEN
Bilirubin Urine: NEGATIVE
GLUCOSE, UA: NEGATIVE mg/dL
Hgb urine dipstick: NEGATIVE
Ketones, ur: NEGATIVE mg/dL
Leukocytes, UA: NEGATIVE
NITRITE: NEGATIVE
PH: 7 (ref 5.0–8.0)
Protein, ur: NEGATIVE mg/dL
SPECIFIC GRAVITY, URINE: 1.005 (ref 1.005–1.030)
Squamous Epithelial / LPF: NONE SEEN

## 2014-12-14 LAB — ETHANOL: Alcohol, Ethyl (B): <5 mg/dL (ref <5)

## 2014-12-14 LAB — SALICYLATE LEVEL: Salicylate Lvl: <4 mg/dL (ref 2.8–30.0)

## 2014-12-14 LAB — PREGNANCY, URINE: Preg Test, Ur: NEGATIVE — AB

## 2014-12-14 NOTE — ED Notes (Signed)

## 2014-12-14 NOTE — ED Provider Notes (Addendum)
-----------------------------------------   7:11 AM on 12/14/2014 -----------------------------------------   BP 123/75 mmHg  Pulse 94  Temp(Src) 98 F (36.7 C) (Oral)  Resp 14  Wt 171 lb (77.565 kg)  SpO2 100%  LMP 12/07/2014 (Approximate)  The patient had no acute events since last update.  Calm and cooperative at this time.  The patient was recently in the emergency department for similar behavioral disturbances.  She is currently awaiting psychiatric evaluation.  I ordered a serum lithium level since she reportedly takes lithium as an outpatient.  ----------------------------------------- 11:07 AM on 12/14/2014 -----------------------------------------  I spoke with Jerilynn Somalvin with behavioral medicine.  He had a long conversation with the group home and they are comfortable bringing her back home at this time.  The patient is at her baseline and acts out but is not truly suicidal or a danger to herself or others.  Her group home is happy to come pick her up and take her home this afternoon.  The patient has been stable in the emergency department.  Hannah Roseory Karia Ehresman, MD 12/14/14 [phone]

## 2014-12-14 NOTE — ED Notes (Signed)
BEHAVIORAL HEALTH ROUNDING Patient sleeping: No. Patient alert and oriented: no Behavior appropriate: Yes.  ; If no, describe:  Nutrition and fluids offered: Yes  Toileting and hygiene offered: Yes  Sitter present: no Law enforcement present: Yes  

## 2014-12-14 NOTE — Discharge Instructions (Signed)

## 2014-12-14 NOTE — ED Notes (Signed)
BEHAVIORAL HEALTH ROUNDING Patient sleeping: Yes.   Patient alert and oriented: not applicable Behavior appropriate: Yes.  ; If no, describe:  Nutrition and fluids offered: No Toileting and hygiene offered: No Sitter present: not applicable Law enforcement present: Yes  

## 2014-12-14 NOTE — ED Notes (Signed)
BEHAVIORAL HEALTH ROUNDING Patient sleeping: Yes.   Patient alert and oriented: not applicable Behavior appropriate: Yes.  ; If no, describe:  Nutrition and fluids offered: No Toileting and hygiene offered: No Sitter present: no Law enforcement present: Yes  

## 2014-12-14 NOTE — ED Notes (Signed)
BEHAVIORAL HEALTH ROUNDING Patient sleeping: No. Patient alert and oriented: yes Behavior appropriate: Yes.  ; If no, describe:  Nutrition and fluids offered: Yes  Toileting and hygiene offered: Yes  Sitter present: no Law enforcement present: Yes BEHAVIORAL HEALTH ROUNDING Patient sleeping: No. Patient alert and oriented: yes Behavior appropriate: Yes.  ; If no, describe:  Nutrition and fluids offered: Yes  Toileting and hygiene offered: Yes  Sitter present: no Law enforcement present: Yes  

## 2014-12-14 NOTE — BH Assessment (Signed)
Assessment Note  Hannah Keller is an 24 y.o. female. Who presents to the ER via law enforcement, after being aggressive at the Group Home([facility]). According to the pt., she was in an argument with her mother because "she wouldn't give me something to drink. I wanted some Ice tea."   According to the Group Home Owner, (Vivian-[phone]), the pt. hasn't spoken with her mother in over 20 years. The pt. has been with her Group Home for a while and is currently exhibiting new behaviors and when she doesn't get her way she acts out.  Owner further explained, the pt. has a behavioral plan that specifically discourages the pt. going to the local ER. In the past, the pt. will voice SI in order to go to the ER for attention, which only reinforce her negatively behaviors. Group Home owner is wanting pt. to return back and continue the appropriate level of treatment there. When law enforcement arrived at the house, they asked the pt. if she was having thoughts of hurting herself. Pt. responding yes. Group Home staff tried to explain to the pt. what she does and her IQ is below 40. However, she was still brought to the ER for further evaluation.  Pt. is currently denying SI/HI and AV/H. She asked Clinical research associatewriter when can she go home.  According to the pt. Mental Health Provider, [facility] [place] (Dr. Jacki ConesLaurie [phone]), the pt. is being followed by their practices and they were the ones who help developed their behavior plan.  She faxed a copy and it has been placed on the pt. paper chart. Mental Health Provider is in agreement of the pt. returning back to the Group Home and continue to follow behavioral plan.  Micah NoelGuardian-Eric Palmer [phone]) [facility] [place]   Discussed with ER MD, pt. is able to discharge back to Group Home and Follow up with Current Out Patient Mental Health Provider [facility] (Dr. Jacki ConesLaurie  [phone]).  Axis I: Conduct Disorder Axis II: Mental retardation, severity unknown Axis III:  Past Medical History  Diagnosis Date  . Mental retardation     Mild  . Paranoid schizophrenia   . Seizures   . Developmental delay    Axis IV: educational problems and problems related to social environment  Past Medical History:  Past Medical History  Diagnosis Date  . Mental retardation     Mild  . Paranoid schizophrenia   . Seizures   . Developmental delay     History reviewed. No pertinent past surgical history.  Family History: History reviewed. No pertinent family history.  Social History:  reports that she has never smoked. She has never used smokeless tobacco. She reports that she does not drink alcohol or use illicit drugs.  Additional Social History:  Alcohol / Drug Use Pain Medications: None reported Prescriptions: None reported Over the Counter: None reported History of alcohol / drug use?: No history of alcohol / drug abuse Longest period of sobriety (when/how long): None reported Negative Consequences of Use:  (None reported) Withdrawal Symptoms:  (None reported)  CIWA: CIWA-Ar BP: 107/71 mmHg Pulse Rate: 88 COWS:    Allergies:  Allergies  Allergen Reactions  . Ritalin [Methylphenidate Hcl]     Caregiver uncertain of reaction.     Home Medications:  (Not in a hospital admission)  OB/GYN Status:  Patient's last menstrual period was 12/07/2014 (approximate).  General Assessment Data Location of Assessment: [facility] ED TTS Assessment: In system Is this  a Tele or Face-to-Face Assessment?: Tele Assessment Is this an Initial Assessment or a Re-assessment for this encounter?: Initial Assessment Marital status: Single Maiden name: n/a Is patient pregnant?: No Pregnancy Status: No Living Arrangements: Group Home ([facility] ([name]-[phone])) Can pt return to current living arrangement?: Yes Admission Status: Other  (Comment) (Brought in via [employer]) Is patient capable of signing voluntary admission?: Yes Referral Source:  [employer](Law Enforcement) Insurance type: Medicaid  Medical Screening Exam ([facility] Walk-in ONLY) Medical Exam completed: Yes  Crisis Care Plan Living Arrangements: Group Home ([facility] ([name]-[phone]))  Education Status Is patient currently in school?: No Highest grade of school patient has completed: Unknown  Risk to self with the past 6 months Suicidal Ideation: No Has patient been a risk to self within the past 6 months prior to admission? : No Suicidal Intent: No Has patient had any suicidal intent within the past 6 months prior to admission? : No Is patient at risk for suicide?: No Suicidal Plan?: No Has patient had any suicidal plan within the past 6 months prior to admission? : No Access to Means: No What has been your use of drugs/alcohol within the last 12 months?: None reported Previous Attempts/Gestures: No How many times?: 0 Other Self Harm Risks: 0 Triggers for Past Attempts: Unknown Intentional Self Injurious Behavior: None Family Suicide History: Unknown Recent stressful life event(s): Other (Comment) (None reported) Persecutory voices/beliefs?: No Depression: No Depression Symptoms: Feeling angry/irritable (None noted) Substance abuse history and/or treatment for substance abuse?: No Suicide prevention information given to non-admitted patients: Not applicable  Risk to Others within the past 6 months Homicidal Ideation: No Does patient have any lifetime risk of violence toward others beyond the six months prior to admission? : Yes (comment) (She history a risk of throwing things and breaking things.) Thoughts of Harm to Others: No Comment - Thoughts of Harm to Others: None reported Current Homicidal Intent: No Current Homicidal Plan: No Access to Homicidal Means: No Identified Victim: None reported History of harm to  others?: Yes (She history a risk of throwing things and breaking things.) Assessment of Violence: In past 6-12 months (She history a risk of throwing things and breaking things.) Violent Behavior Description: She history a risk of throwing things and breaking things. Does patient have access to weapons?: Yes (Comment) Criminal Charges Pending?: No Does patient have a court date: No Is patient on probation?: No  Psychosis Hallucinations: None noted Delusions: None noted  Mental Status Report Appearance/Hygiene: In scrubs, In hospital gown Eye Contact: Poor Motor Activity: Unremarkable Speech: Pressured, Slow, Soft Level of Consciousness: Quiet/awake Mood: Anxious Affect: Blunted Anxiety Level: None Thought Processes: Coherent, Relevant Judgement: Impaired Orientation: Person, Place, Situation Obsessive Compulsive Thoughts/Behaviors: None  Cognitive Functioning Concentration: Normal Memory: Recent Intact, Remote Intact IQ:  (Per Group Home, IQ is less then 40) Insight: Poor Impulse Control: Poor Appetite: Good Weight Loss: 0 Weight Gain: 0 Sleep: No Change Total Hours of Sleep: 8 Vegetative Symptoms: None  ADLScreening [facility] Assessment Services) Patient's cognitive ability adequate to safely complete daily activities?: Yes Patient able to express need for assistance with ADLs?: Yes Independently performs ADLs?: Yes (appropriate for developmental age)  Prior Inpatient Therapy Prior Inpatient Therapy: No  Prior Outpatient Therapy Prior Outpatient Therapy: Yes Prior Therapy Dates: Current Prior Therapy Facilty/Provider(s): Dr. Jacki ConesLaurie [phone] Reason for Treatment: MR/IDD Diagnosis  Does patient have an ACCT team?: No Does patient have Intensive In-House Services?  : No Does patient have [facility] services? :  No Does patient have P4CC services?: No  ADL Screening (condition at time of admission) Patient's cognitive ability adequate to safely complete daily  activities?: Yes Patient able to express need for assistance with ADLs?: Yes Independently performs ADLs?: Yes (appropriate for developmental age)       Abuse/Neglect Assessment (Assessment to be complete while patient is alone) Physical Abuse: Denies (None reported) Verbal Abuse: Denies (None reported) Sexual Abuse: Denies (None reported) Exploitation of patient/patient's resources: Denies (None reported) Self-Neglect: Denies (None reported) Possible abuse reported to::  (None reported) Values / Beliefs Cultural Requests During Hospitalization: None (None reported) Spiritual Requests During Hospitalization: None (None reported) Consults Spiritual Care Consult Needed: No (None reported) Social Work Consult Needed: No (None reported)      Additional Information 1:1 In Past 12 Months?: No CIRT Risk: No Elopement Risk: No Does patient have medical clearance?: Yes  Child/Adolescent Assessment Running Away Risk: Denies (Pt. is an adult)  Disposition:  Disposition Initial Assessment Completed for this Encounter: Yes Disposition of Patient: Outpatient treatment Type of outpatient treatment: Adult (Follow up with current Treatment Provider)  On Site Evaluation by:   Reviewed with Physician:    Morley Kos 12/14/2014 12:18 PM

## 2014-12-14 NOTE — ED Notes (Signed)
Patient assigned to appropriate care area. Patient oriented to unit/care area: Informed that, for their safety, care areas are designed for safety and monitored by security cameras at all times; and visiting hours explained to patient. Patient verbalizes understanding, and verbal contract for safety obtained. 

## 2014-12-14 NOTE — ED Notes (Signed)
BEHAVIORAL HEALTH ROUNDING Patient sleeping: No. Patient alert and oriented: yes Behavior appropriate: Yes.  ; If no, describe:  Nutrition and fluids offered: Yes  Toileting and hygiene offered: Yes  Sitter present: no Law enforcement present: Yes  

## 2014-12-14 NOTE — ED Notes (Signed)
BEHAVIORAL HEALTH ROUNDING Patient sleeping: Yes.   Patient alert and oriented: yes Behavior appropriate: Yes.  ; If no, describe:  Nutrition and fluids offered: Yes  Toileting and hygiene offered: Yes  Sitter present: no Law enforcement present: Yes  

## 2014-12-14 NOTE — ED Notes (Signed)
BEHAVIORAL HEALTH ROUNDING Patient sleeping: Yes.   Patient alert and oriented: not applicable Behavior appropriate: Yes.  ; If no, describe:  Nutrition and fluids offered: no  Toileting and hygiene offered: No Sitter present: no Law enforcement present: Yes  

## 2015-03-03 ENCOUNTER — Other Ambulatory Visit: Payer: Self-pay | Admitting: Family Medicine

## 2015-03-03 DIAGNOSIS — R1310 Dysphagia, unspecified: Secondary | ICD-10-CM

## 2015-03-04 ENCOUNTER — Other Ambulatory Visit: Payer: Self-pay | Admitting: Family Medicine

## 2015-03-04 DIAGNOSIS — R1310 Dysphagia, unspecified: Secondary | ICD-10-CM

## 2015-03-11 ENCOUNTER — Ambulatory Visit: Payer: Medicaid Other

## 2015-03-12 ENCOUNTER — Ambulatory Visit: Admission: RE | Admit: 2015-03-12 | Payer: Medicaid Other | Source: Ambulatory Visit

## 2015-03-22 ENCOUNTER — Ambulatory Visit
Admission: RE | Admit: 2015-03-22 | Discharge: 2015-03-22 | Disposition: A | Discharge status: Home / Self Care | Payer: Medicaid Other | Source: Ambulatory Visit | Attending: Family Medicine | Admitting: Family Medicine

## 2015-03-22 DIAGNOSIS — R1310 Dysphagia, unspecified: Secondary | ICD-10-CM | POA: Insufficient documentation

## 2015-03-22 NOTE — Therapy (Addendum)
[facility] DIAGNOSTIC RADIOLOGY [address] [place], [place], [address] Phone: [phone]   Fax:     Modified Barium Swallow  Patient Details  Name: Hannah Keller MRN: [id number] Date of Birth: 04/07/1991 Referring Provider:  Fernande Bras*  Encounter Date: 03/22/2015   Subjective: Patient behavior: (alertness, ability to follow instructions, etc.): pt alert, required verbal cues and direction to follow instructions. Increased movement in chair overall; impulsivity. Pt was quite pleasant and eager to participate. Caregiver present w/ pt. Feeding assistance required d/t pt's declined Cognitive attention; decreased coordination.  Chief complaint: Caregivers have noted increased episodes of chest congestion. No immediate reports of gross s/s of aspiration by staff/caregivers. Pt eats a regular diet though this is cut well for her. She uses a drink cup w/ a spout to drink thin liquids.    Objective:  Radiological Procedure: A videoflouroscopic evaluation of oral-preparatory, reflex initiation, and pharyngeal phases of the swallow was performed; as well as a screening of the upper esophageal phase. I. POSTURE: upright, seated; head in a more downward position d/t low muscle tone in the neck per Caregiver.   II. VIEW: lateral III. COMPENSATORY STRATEGIES: assistance w/ feeding required IV. BOLUSES ADMINISTERED:  Thin Liquid: 6-7 trials via straw  Nectar-thick Liquid: 2 trials via straw  Honey-thick Liquid: NT  Puree: 4 trials  Mechanical Soft: 1 trials V. RESULTS OF EVALUATION: A. ORAL PREPARATORY PHASE: (The lips, tongue, and velum are observed for strength and coordination)       **Overall Severity Rating: MILD-MODERATE; decreased coordination when receiving po boluses or attempting to suck through the straw; decreased labial seal/tone around straw. Quick lingual pumping movements to transfer the boluses; slight premature spillage w/ liquids  over base of tongue(decreased bolus control). Min. Oral residue b/t trials(diffuse).  B. SWALLOW INITIATION/REFLEX: (The reflex is normal if "triggered" by the time the bolus reached the base of the tongue)  **Overall Severity Rating: MILD-MODERATE; delayed pharyngeal swallow initiation w/ po trials; moreso liquids spilling from the valleculae to the pyriform sinuses as the swallow triggered. No immediate aspiration occurred during the swallow.  C. PHARYNGEAL PHASE: (Pharyngeal function is normal if the bolus shows rapid, smooth, and continuous transit through the pharynx and there is no pharyngeal residue after the swallow)  **Overall Severity Rating: MILD; min. Base of tongue residue noted intermittently w/ increased textures indicating decreased base of tongue contact/strength for the appropriate pharyngeal pressure.   D. LARYNGEAL PENETRATION: (Material entering into the laryngeal inlet/vestibule but not aspirated): slight bolus residue was noted in the laryngeal vestibule post consecutive sips w/ thin liquids x1 - of note, pt was not drinking using her usual drink cup; impulsivity noted.  Most importantly, build up of bolus residue was noted in the laryngeal vestibule d/t suspected pharyngeal residue from retrograde activity of bolus material in the Esophagus. E. ASPIRATION: noted slight-min. bolus residue in the laryngeal vestibue moving below the level of the vocal cords between trials toward the end of the study as retrograde activity in the Esophagus increased.  F. ESOPHAGEAL PHASE: (Screening of the upper esophagus): moderate-severe bolus stasis w/ retrograde bolus activity to the level of the UES; bolus material was seen to move back into the pyriform sinuses b/t trials. This presentation worsened as the po trials continued. By the end of the study, bolus material in the Esophagus was seen filling the Esophagus from just below the UES to the mid/lower Esophagus indicating poor emptying/motility.    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 During po trials, noted retrograde activity of bolus material proximal to the level of the UES then into the pharynx and the pyriform sinuses as po trials continued. By the end of the study, bolus material in the Esophagus filled the entire(viewable) Esophagus from just below the UES to the mid/ower Esophagus indicating poor emptying/motility. Suspect the increased pharyngeal residue, as well as the impact of decreased pharyngeal pressure and full clearing of bolus material through the pharynx, is increasing risk for laryngeal penetration and aspiration which was noted during this study; both the laryngeal penetration and slight aspiration were Silent. Pt is at increased risk for continued aspiration from this deficit. Of note, pt does have min. increased risk for aspiration d/t min. decreased oral control and a min. delay in pharyngeal swallow initiation. However, the majority of pt's deficits were seen in the Esophageal phase of her swallowing. Rec. thorough f/u w/ GI for direct view of the Esophagus to r/o stricture or some other motility issue.   PLAN/RECOMMENDATIONS:  A. Diet: mech soft, thin liquids using her drink cup   B. Swallowing Precautions: general aspiration precautions - monitor for any impulsive eating/drinking behaviors   C. Recommended consultation to: GI  D. Therapy recommendations: no skilled ST services indicated at this time  E. Results and recommendations were discussed w/ pt's Caregiver      End of Session - April 04, 2015 1552    Visit Number 1   Number of Visits 1   Date for SLP Re-Evaluation Apr 04, 2015   Activity Tolerance Patient tolerated treatment well      Past Medical History  Diagnosis Date  . Mental retardation     Mild  . Paranoid schizophrenia   . Seizures   . Developmental delay     No past surgical history on  file.  There were no vitals filed for this visit.  Visit Diagnosis: Dysphagia - Plan: DG SWALLOWING FUNC-SPEECH PATHOLOGY, DG SWALLOWING FUNC-SPEECH PATHOLOGY                               G-Codes - 2015/04/04 1553    Functional Assessment Tool Used clinical judgement; MBSS   Functional Limitations Swallowing   Swallow Current Status ([id number]) At least 1 percent but less than 20 percent impaired, limited or restricted   Swallow Goal Status (F6213) At least 1 percent but less than 20 percent impaired, limited or restricted   Swallow Discharge Status [id number]) At least 1 percent but less than 20 percent impaired, limited or restricted          Problem List Patient Active Problem List   Diagnosis Date Noted  . Mental retardation, idiopathic moderate 12/01/2014   Jerilynn Som, MS, CCC-SLP  Beulah Capobianco 04-04-15, 3:53 PM  [facility] DIAGNOSTIC RADIOLOGY [address] [place], [place], [address] Phone: [phone]   Fax:

## 2015-03-23 ENCOUNTER — Encounter: Payer: Self-pay | Admitting: Emergency Medicine

## 2015-03-23 ENCOUNTER — Emergency Department
Admission: EM | Admit: 2015-03-23 | Discharge: 2015-03-23 | Disposition: A | Discharge status: Home / Self Care | Payer: Medicaid Other | Attending: Emergency Medicine | Admitting: Emergency Medicine

## 2015-03-23 ENCOUNTER — Emergency Department: Payer: Medicaid Other

## 2015-03-23 DIAGNOSIS — R269 Unspecified abnormalities of gait and mobility: Secondary | ICD-10-CM | POA: Diagnosis present

## 2015-03-23 DIAGNOSIS — R270 Ataxia, unspecified: Secondary | ICD-10-CM

## 2015-03-23 DIAGNOSIS — Z79899 Other long term (current) drug therapy: Secondary | ICD-10-CM | POA: Diagnosis not present

## 2015-03-23 MED ORDER — MECLIZINE HCL 32 MG PO TABS: 32.0000 mg | ORAL_TABLET | Freq: Three times a day (TID) | ORAL | Status: DC | PRN

## 2015-03-23 NOTE — ED Notes (Signed)
pts group home director here with pt.  Reports just dx with ear infection.  States she finished her amoxiciilin but still having unsteady gait.

## 2015-03-23 NOTE — Therapy (Signed)
[facility] DIAGNOSTIC RADIOLOGY [address] [place], [place], [address] Phone: [phone]   Fax:     Modified Barium Swallow  Patient Details  Name: Rebbie Lauricella MRN: [id number] Date of Birth: 04/07/1991 Referring Provider:  Fernande Bras*  Encounter Date: 03/22/2015   Subjective: Patient behavior: (alertness, ability to follow instructions, etc.): pt alert, required verbal cues and direction to follow instructions. Increased movement in chair overall; impulsivity. Pt was quite pleasant and eager to participate. Caregiver present w/ pt. Feeding assistance required d/t pt's declined Cognitive attention; decreased coordination.  Chief complaint: Caregivers have noted increased episodes of chest congestion. No immediate reports of gross s/s of aspiration by staff/caregivers. Pt eats a regular diet though this is cut well for her. She uses a drink cup w/ a spout to drink thin liquids.    Objective:  Radiological Procedure: A videoflouroscopic evaluation of oral-preparatory, reflex initiation, and pharyngeal phases of the swallow was performed; as well as a screening of the upper esophageal phase. I. POSTURE: upright, seated; head in a more downward position d/t low muscle tone in the neck per Caregiver.   II. VIEW: lateral III. COMPENSATORY STRATEGIES: assistance w/ feeding required IV. BOLUSES ADMINISTERED:  Thin Liquid: 6-7 trials via straw  Nectar-thick Liquid: 2 trials via straw  Honey-thick Liquid: NT  Puree: 4 trials  Mechanical Soft: 1 trials V. RESULTS OF EVALUATION: A. ORAL PREPARATORY PHASE: (The lips, tongue, and velum are observed for strength and coordination)       **Overall Severity Rating: MILD-MODERATE; decreased coordination when receiving po boluses or attempting to suck through the straw; decreased labial seal/tone around straw. Quick lingual pumping movements to transfer the boluses; slight premature spillage w/ liquids  over base of tongue(decreased bolus control). Min. Oral residue b/t trials(diffuse).  B. SWALLOW INITIATION/REFLEX: (The reflex is normal if "triggered" by the time the bolus reached the base of the tongue)  **Overall Severity Rating: MILD-MODERATE; delayed pharyngeal swallow initiation w/ po trials; moreso liquids spilling from the valleculae to the pyriform sinuses as the swallow triggered. No immediate aspiration occurred during the swallow.  C. PHARYNGEAL PHASE: (Pharyngeal function is normal if the bolus shows rapid, smooth, and continuous transit through the pharynx and there is no pharyngeal residue after the swallow)  **Overall Severity Rating: MILD; min. Base of tongue residue noted intermittently w/ increased textures indicating decreased base of tongue contact/strength for the appropriate pharyngeal pressure.   D. LARYNGEAL PENETRATION: (Material entering into the laryngeal inlet/vestibule but not aspirated): slight bolus residue was noted in the laryngeal vestibule post consecutive sips w/ thin liquids x1 - of note, pt was not drinking using her usual drink cup; impulsivity noted.  Most importantly, build up of bolus residue was noted in the laryngeal vestibule d/t suspected pharyngeal residue from retrograde activity of bolus material in the Esophagus. E. ASPIRATION: noted slight-min. bolus residue in the laryngeal vestibue moving below the level of the vocal cords between trials toward the end of the study as retrograde activity in the Esophagus increased.  F. ESOPHAGEAL PHASE: (Screening of the upper esophagus): moderate-severe bolus stasis w/ retrograde bolus activity to the level of the UES; bolus material was seen to move back into the pyriform sinuses b/t trials. This presentation worsened as the po trials continued. By the end of the study, bolus material in the Esophagus was seen filling the Esophagus from just below the UES to the mid/lower Esophagus indicating poor emptying/motility.    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 During po trials, noted retrograde activity of bolus material proximal to the level of the UES then into the pharynx and the pyriform sinuses as po trials continued. By the end of the study, bolus material in the Esophagus filled the entire(viewable) Esophagus from just below the UES to the mid/ower Esophagus indicating poor emptying/motility. Suspect the increased pharyngeal residue, as well as the impact of decreased pharyngeal pressure and full clearing of bolus material through the pharynx, is increasing risk for laryngeal penetration and aspiration which was noted during this study; both the laryngeal penetration and slight aspiration were Silent. Pt is at increased risk for continued aspiration from this deficit. Of note, pt does have min. increased risk for aspiration d/t min. decreased oral control and a min. delay in pharyngeal swallow initiation. However, the majority of pt's deficits were seen in the Esophageal phase of her swallowing. Rec. thorough f/u w/ GI for direct view of the Esophagus to r/o stricture or some other motility issue.   PLAN/RECOMMENDATIONS:  A. Diet: mech soft, thin liquids using her drink cup   B. Swallowing Precautions: general aspiration precautions - monitor for any impulsive eating/drinking behaviors   C. Recommended consultation to: GI  D. Therapy recommendations: no skilled ST services indicated at this time  E. Results and recommendations were discussed w/ pt's Caregiver      End of Session - 04/06/15 1552    Visit Number 1   Number of Visits 1   Date for SLP Re-Evaluation 04-06-2015   Activity Tolerance Patient tolerated treatment well      Past Medical History  Diagnosis Date  . Mental retardation     Mild  . Paranoid schizophrenia   . Seizures   . Developmental delay     No past surgical history on  file.  There were no vitals filed for this visit.  Visit Diagnosis: Dysphagia - Plan: DG SWALLOWING FUNC-SPEECH PATHOLOGY, DG SWALLOWING FUNC-SPEECH PATHOLOGY                               G-Codes - Apr 06, 2015 1553    Functional Assessment Tool Used clinical judgement; MBSS   Functional Limitations Swallowing   Swallow Current Status (Z6109) At least 1 percent but less than 20 percent impaired, limited or restricted   Swallow Goal Status (U0454) At least 1 percent but less than 20 percent impaired, limited or restricted   Swallow Discharge Status [phone]) At least 1 percent but less than 20 percent impaired, limited or restricted          Problem List Patient Active Problem List   Diagnosis Date Noted  . Mental retardation, idiopathic moderate 12/01/2014   Jerilynn Som, MS, CCC-SLP  [name],[name] 03/23/2015, 10:20 AM  [place] [facility] DIAGNOSTIC RADIOLOGY [address] [place], [place], [address] Phone: [phone]   Fax:

## 2015-03-23 NOTE — ED Notes (Signed)
Pt with group home director, states she finished amoxicillin abx course but is still staggering, unsteady on her feet, also states "her eyes are bigger, they normally are big but seems like they are bulging", also states some blue paste drainage coming from her nose, nothing in nose visiable upon this RN assessment

## 2015-03-23 NOTE — ED Provider Notes (Signed)
[facility] Emergency Department Provider Note    ____________________________________________  Time seen: 1245  I have reviewed the triage vital signs and the nursing notes.   HISTORY  Chief Complaint Gait problem  History limited by: MR. Some history obtained by group home representative   HPI Hannah Keller is a 24 y.o. female who presents to the emergency department with primary concern of achy instability. Per group home staff this is been going on for 1 week. It has been persistent. Normally the patient is able to walk around easily on her own. The patient herself states that she is scared to get up because she is so off balance. She has never had symptoms like this at the group home staff is aware of. The patient additionally has been having some nasal discharge occasionally the past couple of days. Patient is not had any recent fevers.    Past Medical History  Diagnosis Date  . Mental retardation     Mild  . Paranoid schizophrenia   . Seizures   . Developmental delay     Patient Active Problem List   Diagnosis Date Noted  . Mental retardation, idiopathic moderate 12/01/2014    History reviewed. No pertinent past surgical history.  Current Outpatient Rx  Name  Route  Sig  Dispense  Refill  . ARIPiprazole (ABILIFY MAINTENA) 400 MG SUSR   Intramuscular   Inject into the muscle every 30 (thirty) days.         . Asenapine Maleate (SAPHRIS) 10 MG SUBL   Sublingual   Place 1 each under the tongue 3 (three) times daily.         . benztropine (COGENTIN) 0.5 MG tablet   Oral   Take 0.5 mg by mouth at bedtime.         . cholecalciferol (VITAMIN D) 1000 UNITS tablet   Oral   Take 1,000 Units by mouth 2 (two) times daily.         . clonazePAM (KLONOPIN) 1 MG tablet   Oral   Take 1 mg by mouth 3 (three) times daily.          . cloNIDine (CATAPRES - DOSED IN MG/24 HR) 0.3 mg/24hr patch   Transdermal   Place 0.3 mg onto the skin  once a week.         . divalproex (DEPAKOTE SPRINKLE) 125 MG capsule   Oral   Take 750 mg by mouth at bedtime.         . divalproex (DEPAKOTE) 125 MG DR tablet   Oral   Take 125 mg by mouth every morning. 1 capsule in the morning         . docusate sodium (COLACE) 100 MG capsule   Oral   Take 200 mg by mouth daily.          Marland Kitchen lithium carbonate (LITHOBID) 300 MG CR tablet   Oral   Take 300 mg by mouth every morning.          . lithium carbonate (LITHOBID) 300 MG CR tablet   Oral   Take 600 mg by mouth at bedtime.         . medroxyPROGESTERone (DEPO-PROVERA) 150 MG/ML injection   Intramuscular   Inject 150 mg into the muscle every 3 (three) months.         . Olopatadine HCl (PATADAY) 0.2 % SOLN   Ophthalmic   Apply 1 drop to eye daily as needed.         Marland Kitchen  PARoxetine (PAXIL) 20 MG tablet   Oral   Take 20 mg by mouth daily.         . polyethylene glycol (MIRALAX / GLYCOLAX) packet   Oral   Take 17 g by mouth daily.         . QUEtiapine (SEROQUEL) 50 MG tablet   Oral   Take 50 mg by mouth 3 (three) times daily.         . solifenacin (VESICARE) 10 MG tablet   Oral   Take 10 mg by mouth daily.         . valACYclovir (VALTREX) 1000 MG tablet   Oral   Take 1,000 mg by mouth 2 (two) times daily as needed (for one day with outbreak fever blister).           Allergies Ritalin  History reviewed. No pertinent family history.  Social History Social History  Substance Use Topics  . Smoking status: Never Smoker   . Smokeless tobacco: Never Used  . Alcohol Use: No    Review of Systems  Constitutional: Negative for fever. Cardiovascular: Negative for chest pain. Respiratory: Negative for shortness of breath. Gastrointestinal: Negative for abdominal pain, vomiting and diarrhea. Genitourinary: Negative for dysuria. Musculoskeletal: Negative for back pain. Skin: Negative for rash. Neurological: Difficulty walking  10-point ROS otherwise  negative.  ____________________________________________   PHYSICAL EXAM:  VITAL SIGNS: ED Triage Vitals  Enc Vitals Group     BP 03/23/15 1208 99/65 mmHg     Pulse Rate 03/23/15 1208 89     Resp 03/23/15 1208 18     Temp 03/23/15 1208 98.8 F (37.1 C)     Temp Source 03/23/15 1208 Oral     SpO2 03/23/15 1208 97 %     Weight 03/23/15 1208 170 lb (77.111 kg)     Height 03/23/15 1208 5' (1.524 m)     Head Cir --      Peak Flow --      Pain Score 03/23/15 1207 5   Constitutional: Awake and alert. Pleasant. In no acute distress. Eyes: Mild proptosis and fasciculating eye movements - both are baseline per nursing home staff. ENT   Head: Atraumatic.   Nose: No congestion/rhinnorhea.   Mouth/Throat: Mucous membranes are moist.   Neck: No stridor. Hematological/Lymphatic/Immunilogical: No cervical lymphadenopathy. Cardiovascular: Normal rate, regular rhythm.  No murmurs, rubs, or gallops. Respiratory: Normal respiratory effort without tachypnea nor retractions. Breath sounds are clear and equal bilaterally. No wheezes/rales/rhonchi. Gastrointestinal: Soft and nontender. No distention.  Genitourinary: Deferred Musculoskeletal: Normal range of motion in all extremities. No joint effusions.  No lower extremity tenderness nor edema. Neurologic:  MR. Pleasant. Interactive. Awake and alert. Moves all extremities. Ataxic on gait testing.  Skin:  Skin is warm, dry and intact. No rash noted.   ____________________________________________    LABS (pertinent positives/negatives)  None  ____________________________________________   EKG  None  ____________________________________________    RADIOLOGY  MR Brain IMPRESSION: 1. Stable noncontrast MRI appearance of the brain since 2014. 2. Mild-to-moderate paranasal sinus inflammation primarily at the right ethmoids. Mastoids and tympanic cavities are clear. 3. 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 If there are multiple cranial neuropathies consider cranial nerve entrapment such as in hyperostosis cranialis interna.   ____________________________________________   PROCEDURES  Procedure(s) performed: None  Critical Care performed: No  ____________________________________________   INITIAL IMPRESSION / ASSESSMENT AND PLAN / ED COURSE  Pertinent labs & imaging results that were available during my  care of the patient were reviewed by me and considered in my medical decision making (see chart for details).  Patient presents to the emergency department today with primary concern for gait instability. On testing patient was ataxic. MRI brain was obtained which did not show any acute infarct however there was concern for possible hyperostosis cranialis interna secondary to hyperostosis of the skull. I did discuss  with ENT on call who recommended the patient be seen by a neuro ENT in follow up. Also recommended a course of antivert to see if that helped with the symptoms. Will ask staff to fax information to [facility] for potential follow up. Did discuss plan with group home staff.   ____________________________________________   FINAL CLINICAL IMPRESSION(S) / ED DIAGNOSES  Final diagnoses:  Ataxia     Phineas Semen, MD 03/23/15 1735

## 2015-03-23 NOTE — Discharge Instructions (Signed)
Please have Geetika be seen for any high fevers, chest pain, shortness of breath, change in behavior, persistent vomiting, bloody stool or any other new or concerning symptoms.  Dizziness Dizziness is a common problem. It is a feeling of unsteadiness or light-headedness. You may feel like you are about to faint. Dizziness can lead to injury if you stumble or fall. A person of any age group can suffer from dizziness, but dizziness is more common in older adults. CAUSES  Dizziness can be caused by many different things, including:  Middle ear problems.  Standing for too long.  Infections.  An allergic reaction.  Aging.  An emotional response to something, such as the sight of blood.  Side effects of medicines.  Tiredness.  Problems with circulation or blood pressure.  Excessive use of alcohol or medicines, or illegal drug use.  Breathing too fast (hyperventilation).  An irregular heart rhythm (arrhythmia).  A low red blood cell count (anemia).  Pregnancy.  Vomiting, diarrhea, fever, or other illnesses that cause body fluid loss (dehydration).  Diseases or conditions such as Parkinson's disease, high blood pressure (hypertension), diabetes, and thyroid problems.  Exposure to extreme heat. DIAGNOSIS  Your health care provider will ask about your symptoms, perform a physical exam, and perform an electrocardiogram (ECG) to record the electrical activity of your heart. Your health care provider may also perform other heart or blood tests to determine the cause of your dizziness. These may include:  Transthoracic echocardiogram (TTE). During echocardiography, sound waves are used to evaluate how blood flows through your heart.  Transesophageal echocardiogram (TEE).  Cardiac monitoring. This allows your health care provider to monitor your heart rate and rhythm in real time.  Holter monitor. This is a portable device that records your heartbeat and can help diagnose heart  arrhythmias. It allows your health care provider to track your heart activity for several days if needed.  Stress tests by exercise or by giving medicine that makes the heart beat faster. TREATMENT  Treatment of dizziness depends on the cause of your symptoms and can vary greatly. HOME CARE INSTRUCTIONS   Drink enough fluids to keep your urine clear or pale yellow. This is especially important in very hot weather. In older adults, it is also important in cold weather.  Take your medicine exactly as directed if your dizziness is caused by medicines. When taking blood pressure medicines, it is especially important to get up slowly.  Rise slowly from chairs and steady yourself until you feel okay.  In the morning, first sit up on the side of the bed. When you feel okay, stand slowly while holding onto something until you know your balance is fine.  Move your legs often if you need to stand in one place for a long time. Tighten and relax your muscles in your legs while standing.  Have someone stay with you for 1-2 days if dizziness continues to be a problem. Do this until you feel you are well enough to stay alone. Have the person call your health care provider if he or she notices changes in you that are concerning.  Do not drive or use heavy machinery if you feel dizzy.  Do not drink alcohol. SEEK IMMEDIATE MEDICAL CARE IF:   Your dizziness or light-headedness gets worse.  You feel nauseous or vomit.  You have problems talking, walking, or using your arms, hands, or legs.  You feel weak.  You are not thinking clearly or you have trouble forming sentences. It  take a friend or family member to notice this. °· You have chest pain, abdominal pain, shortness of breath, or sweating. °· Your vision changes. °· You notice any bleeding. °· You have side effects from medicine that seems to be getting worse rather than better. °MAKE SURE YOU:  °· Understand these instructions. °· Will watch  your condition. °· Will get help right away if you are not doing well or get worse. °Document Released: 01/10/2001 Document Revised: 07/22/2013 Document Reviewed: 02/03/2011 °[employer]® Patient Information ©2015 [employer]. This information is not intended to replace advice given to you by your health care provider. Make sure you discuss any questions you have with your health care provider. ° ° °

## 2015-03-23 NOTE — ED Notes (Signed)
Patient and care giver transported back to Flex [facility] by MRI tech.

## 2015-03-29 ENCOUNTER — Emergency Department: Payer: Medicaid Other

## 2015-03-29 ENCOUNTER — Inpatient Hospital Stay
Admission: EM | Admit: 2015-03-29 | Discharge: 2015-04-06 | DRG: 091 | Payer: Medicaid Other | Attending: Internal Medicine | Admitting: Internal Medicine

## 2015-03-29 ENCOUNTER — Encounter: Payer: Self-pay | Admitting: Emergency Medicine

## 2015-03-29 DIAGNOSIS — J690 Pneumonitis due to inhalation of food and vomit: Secondary | ICD-10-CM | POA: Diagnosis present

## 2015-03-29 DIAGNOSIS — I441 Atrioventricular block, second degree: Secondary | ICD-10-CM | POA: Insufficient documentation

## 2015-03-29 DIAGNOSIS — R0902 Hypoxemia: Secondary | ICD-10-CM | POA: Diagnosis present

## 2015-03-29 DIAGNOSIS — E870 Hyperosmolality and hypernatremia: Secondary | ICD-10-CM | POA: Diagnosis present

## 2015-03-29 DIAGNOSIS — R001 Bradycardia, unspecified: Secondary | ICD-10-CM | POA: Diagnosis not present

## 2015-03-29 DIAGNOSIS — E876 Hypokalemia: Secondary | ICD-10-CM | POA: Diagnosis not present

## 2015-03-29 DIAGNOSIS — I129 Hypertensive chronic kidney disease with stage 1 through stage 4 chronic kidney disease, or unspecified chronic kidney disease: Secondary | ICD-10-CM | POA: Diagnosis present

## 2015-03-29 DIAGNOSIS — E860 Dehydration: Secondary | ICD-10-CM | POA: Diagnosis present

## 2015-03-29 DIAGNOSIS — G4730 Sleep apnea, unspecified: Secondary | ICD-10-CM | POA: Diagnosis not present

## 2015-03-29 DIAGNOSIS — G40909 Epilepsy, unspecified, not intractable, without status epilepticus: Secondary | ICD-10-CM | POA: Diagnosis present

## 2015-03-29 DIAGNOSIS — I959 Hypotension, unspecified: Secondary | ICD-10-CM | POA: Diagnosis not present

## 2015-03-29 DIAGNOSIS — F79 Unspecified intellectual disabilities: Secondary | ICD-10-CM | POA: Diagnosis not present

## 2015-03-29 DIAGNOSIS — Z9109 Other allergy status, other than to drugs and biological substances: Secondary | ICD-10-CM

## 2015-03-29 DIAGNOSIS — R410 Disorientation, unspecified: Secondary | ICD-10-CM | POA: Diagnosis not present

## 2015-03-29 DIAGNOSIS — D649 Anemia, unspecified: Secondary | ICD-10-CM | POA: Diagnosis present

## 2015-03-29 DIAGNOSIS — R0683 Snoring: Secondary | ICD-10-CM | POA: Diagnosis present

## 2015-03-29 DIAGNOSIS — K029 Dental caries, unspecified: Secondary | ICD-10-CM | POA: Diagnosis present

## 2015-03-29 DIAGNOSIS — K5900 Constipation, unspecified: Secondary | ICD-10-CM | POA: Diagnosis present

## 2015-03-29 DIAGNOSIS — K219 Gastro-esophageal reflux disease without esophagitis: Secondary | ICD-10-CM | POA: Diagnosis present

## 2015-03-29 DIAGNOSIS — Y929 Unspecified place or not applicable: Secondary | ICD-10-CM | POA: Diagnosis not present

## 2015-03-29 DIAGNOSIS — R451 Restlessness and agitation: Secondary | ICD-10-CM | POA: Diagnosis not present

## 2015-03-29 DIAGNOSIS — I455 Other specified heart block: Secondary | ICD-10-CM | POA: Diagnosis not present

## 2015-03-29 DIAGNOSIS — T43595A Adverse effect of other antipsychotics and neuroleptics, initial encounter: Secondary | ICD-10-CM | POA: Diagnosis present

## 2015-03-29 DIAGNOSIS — R0600 Dyspnea, unspecified: Secondary | ICD-10-CM | POA: Diagnosis not present

## 2015-03-29 DIAGNOSIS — T56891A Toxic effect of other metals, accidental (unintentional), initial encounter: Secondary | ICD-10-CM | POA: Diagnosis present

## 2015-03-29 DIAGNOSIS — N179 Acute kidney failure, unspecified: Secondary | ICD-10-CM | POA: Insufficient documentation

## 2015-03-29 DIAGNOSIS — Z791 Long term (current) use of non-steroidal anti-inflammatories (NSAID): Secondary | ICD-10-CM

## 2015-03-29 DIAGNOSIS — D696 Thrombocytopenia, unspecified: Secondary | ICD-10-CM | POA: Diagnosis present

## 2015-03-29 DIAGNOSIS — F70 Mild intellectual disabilities: Secondary | ICD-10-CM | POA: Diagnosis present

## 2015-03-29 DIAGNOSIS — R270 Ataxia, unspecified: Secondary | ICD-10-CM | POA: Diagnosis present

## 2015-03-29 DIAGNOSIS — K224 Dyskinesia of esophagus: Secondary | ICD-10-CM | POA: Diagnosis present

## 2015-03-29 DIAGNOSIS — I469 Cardiac arrest, cause unspecified: Secondary | ICD-10-CM | POA: Diagnosis not present

## 2015-03-29 DIAGNOSIS — G92 Toxic encephalopathy: Principal | ICD-10-CM | POA: Diagnosis present

## 2015-03-29 DIAGNOSIS — D6489 Other specified anemias: Secondary | ICD-10-CM | POA: Diagnosis not present

## 2015-03-29 DIAGNOSIS — N182 Chronic kidney disease, stage 2 (mild): Secondary | ICD-10-CM | POA: Diagnosis present

## 2015-03-29 DIAGNOSIS — F200 Paranoid schizophrenia: Secondary | ICD-10-CM | POA: Diagnosis present

## 2015-03-29 DIAGNOSIS — Z79899 Other long term (current) drug therapy: Secondary | ICD-10-CM | POA: Diagnosis not present

## 2015-03-29 DIAGNOSIS — I498 Other specified cardiac arrhythmias: Secondary | ICD-10-CM | POA: Diagnosis not present

## 2015-03-29 LAB — URINALYSIS COMPLETE WITH MICROSCOPIC (ARMC ONLY)
BILIRUBIN URINE: NEGATIVE
Glucose, UA: NEGATIVE mg/dL
HGB URINE DIPSTICK: NEGATIVE
KETONES UR: NEGATIVE mg/dL
LEUKOCYTES UA: NEGATIVE
Nitrite: NEGATIVE
PH: 5 (ref 5.0–8.0)
Protein, ur: NEGATIVE mg/dL
SPECIFIC GRAVITY, URINE: 1.015 (ref 1.005–1.030)
Squamous Epithelial / LPF: NONE SEEN

## 2015-03-29 LAB — COMPREHENSIVE METABOLIC PANEL
ALK PHOS: 115 U/L (ref 38–126)
ALT: 24 U/L (ref 14–54)
ANION GAP: 4 — AB (ref 5–15)
AST: 37 U/L (ref 15–41)
Albumin: 3.3 g/dL — ABNORMAL LOW (ref 3.5–5.0)
BUN: 42 mg/dL — ABNORMAL HIGH (ref 6–20)
CALCIUM: 11.3 mg/dL — AB (ref 8.9–10.3)
CHLORIDE: 116 mmol/L — AB (ref 101–111)
CO2: 28 mmol/L (ref 22–32)
Creatinine, Ser: 2.45 mg/dL — ABNORMAL HIGH (ref 0.44–1.00)
GFR calc non Af Amer: 26 mL/min — ABNORMAL LOW (ref >60)
GFR, EST AFRICAN AMERICAN: 31 mL/min — AB (ref >60)
Glucose, Bld: 101 mg/dL — ABNORMAL HIGH (ref 65–99)
Potassium: 3.5 mmol/L (ref 3.5–5.1)
SODIUM: 148 mmol/L — AB (ref 135–145)
Total Bilirubin: 0.4 mg/dL (ref 0.3–1.2)
Total Protein: 7 g/dL (ref 6.5–8.1)

## 2015-03-29 LAB — CBC
HCT: 34.4 % — ABNORMAL LOW (ref 35.0–47.0)
HEMOGLOBIN: 11.4 g/dL — AB (ref 12.0–16.0)
MCH: 32 pg (ref 26.0–34.0)
MCHC: 33.1 g/dL (ref 32.0–36.0)
MCV: 96.7 fL (ref 80.0–100.0)
Platelets: 129 10*3/uL — ABNORMAL LOW (ref 150–440)
RBC: 3.56 MIL/uL — AB (ref 3.80–5.20)
RDW: 14.1 % (ref 11.5–14.5)
WBC: 35.2 10*3/uL — ABNORMAL HIGH (ref 3.6–11.0)

## 2015-03-29 LAB — LITHIUM LEVEL: Lithium Lvl: 4.4 mmol/L (ref 0.60–1.20)

## 2015-03-29 LAB — VALPROIC ACID LEVEL: VALPROIC ACID LVL: 110 ug/mL — AB (ref 50.0–100.0)

## 2015-03-29 LAB — LIPASE, BLOOD: Lipase: 300 U/L — ABNORMAL HIGH (ref 22–51)

## 2015-03-29 MED ORDER — ACETAMINOPHEN 650 MG RE SUPP: 650.0000 mg | Freq: Four times a day (QID) | RECTAL | Status: DC | PRN

## 2015-03-29 MED ORDER — SODIUM CHLORIDE 0.9 % IV SOLN: 100.0000 mL | INTRAVENOUS | Status: DC | PRN

## 2015-03-29 MED ORDER — QUETIAPINE FUMARATE 25 MG PO TABS
50.0000 mg | ORAL_TABLET | Freq: Three times a day (TID) | ORAL | Status: DC
  Administered 2015-03-30 (×2): 50 mg via ORAL
  Filled 2015-03-29 (×2): qty 2

## 2015-03-29 MED ORDER — LIDOCAINE HCL (PF) 1 % IJ SOLN: 5.0000 mL | INTRAMUSCULAR | Status: DC | PRN

## 2015-03-29 MED ORDER — LORAZEPAM 2 MG/ML IJ SOLN
INTRAMUSCULAR | Status: AC
  Administered 2015-03-29: 2 mg via INTRAVENOUS
  Filled 2015-03-29: qty 1

## 2015-03-29 MED ORDER — MIDAZOLAM HCL 2 MG/2ML IJ SOLN
2.0000 mg | Freq: Once | INTRAMUSCULAR | Status: AC
  Administered 2015-03-29: 2 mg via INTRAVENOUS

## 2015-03-29 MED ORDER — OLOPATADINE HCL 0.1 % OP SOLN
1.0000 [drp] | Freq: Two times a day (BID) | OPHTHALMIC | Status: DC
  Administered 2015-03-30 – 2015-04-06 (×14): 1 [drp] via OPHTHALMIC
  Filled 2015-03-29 (×2): qty 5

## 2015-03-29 MED ORDER — ACETAMINOPHEN 325 MG PO TABS: 650.0000 mg | ORAL_TABLET | Freq: Four times a day (QID) | ORAL | Status: DC | PRN

## 2015-03-29 MED ORDER — ACETAMINOPHEN 325 MG PO TABS: 325.0000 mg | ORAL_TABLET | Freq: Four times a day (QID) | ORAL | Status: DC | PRN

## 2015-03-29 MED ORDER — HEPARIN SODIUM (PORCINE) 5000 UNIT/ML IJ SOLN
5000.0000 [IU] | Freq: Three times a day (TID) | INTRAMUSCULAR | Status: DC
  Administered 2015-03-29 – 2015-04-06 (×23): 5000 [IU] via SUBCUTANEOUS
  Filled 2015-03-29 (×23): qty 1

## 2015-03-29 MED ORDER — LORAZEPAM 2 MG/ML IJ SOLN
2.0000 mg | INTRAMUSCULAR | Status: DC | PRN
  Administered 2015-03-29 – 2015-04-05 (×11): 2 mg via INTRAVENOUS
  Filled 2015-03-29 (×10): qty 1

## 2015-03-29 MED ORDER — DARIFENACIN HYDROBROMIDE ER 7.5 MG PO TB24
7.5000 mg | ORAL_TABLET | Freq: Every day | ORAL | Status: DC
  Administered 2015-03-30: 7.5 mg via ORAL
  Filled 2015-03-29: qty 1

## 2015-03-29 MED ORDER — ONDANSETRON HCL 4 MG PO TABS: 4.0000 mg | ORAL_TABLET | Freq: Four times a day (QID) | ORAL | Status: DC | PRN

## 2015-03-29 MED ORDER — SODIUM CHLORIDE 0.9 % IV SOLN: INTRAVENOUS | Status: DC

## 2015-03-29 MED ORDER — POLYETHYLENE GLYCOL 3350 17 G PO PACK
17.0000 g | PACK | ORAL | Status: DC
  Administered 2015-03-30: 17 g via ORAL
  Filled 2015-03-29: qty 1

## 2015-03-29 MED ORDER — ONDANSETRON HCL 4 MG/2ML IJ SOLN: 4.0000 mg | Freq: Four times a day (QID) | INTRAMUSCULAR | Status: DC | PRN

## 2015-03-29 MED ORDER — LIDOCAINE-PRILOCAINE 2.5-2.5 % EX CREA: 1.0000 "application " | TOPICAL_CREAM | CUTANEOUS | Status: DC | PRN

## 2015-03-29 MED ORDER — VITAMIN D 1000 UNITS PO TABS
1000.0000 [IU] | ORAL_TABLET | Freq: Two times a day (BID) | ORAL | Status: DC
  Administered 2015-03-30: 1000 [IU] via ORAL
  Filled 2015-03-29 (×3): qty 1

## 2015-03-29 MED ORDER — VALACYCLOVIR HCL 500 MG PO TABS: 2000.0000 mg | ORAL_TABLET | Freq: Two times a day (BID) | ORAL | Status: DC | PRN

## 2015-03-29 MED ORDER — PENTAFLUOROPROP-TETRAFLUOROETH EX AERO: 1.0000 "application " | INHALATION_SPRAY | CUTANEOUS | Status: DC | PRN

## 2015-03-29 MED ORDER — MECLIZINE HCL 25 MG PO TABS: 25.0000 mg | ORAL_TABLET | Freq: Three times a day (TID) | ORAL | Status: DC | PRN

## 2015-03-29 MED ORDER — SODIUM CHLORIDE 0.45 % IV SOLN
INTRAVENOUS | Status: DC
  Administered 2015-03-29 – 2015-03-30 (×3): via INTRAVENOUS
  Administered 2015-03-30: 125 mL/h via INTRAVENOUS
  Administered 2015-03-31: 13:00:00 via INTRAVENOUS
  Administered 2015-03-31 – 2015-04-01 (×2): 125 mL/h via INTRAVENOUS
  Administered 2015-04-01 – 2015-04-02 (×3): via INTRAVENOUS
  Administered 2015-04-03: 125 mL/h via INTRAVENOUS

## 2015-03-29 MED ORDER — NEPRO/CARBSTEADY PO LIQD: 237.0000 mL | ORAL | Status: DC | PRN

## 2015-03-29 MED ORDER — INFLUENZA VAC SPLIT QUAD 0.5 ML IM SUSY: 0.5000 mL | PREFILLED_SYRINGE | INTRAMUSCULAR | Status: DC

## 2015-03-29 MED ORDER — CLONIDINE HCL 0.3 MG/24HR TD PTWK: 0.3000 mg | MEDICATED_PATCH | TRANSDERMAL | Status: DC

## 2015-03-29 MED ORDER — HEPARIN SODIUM (PORCINE) 1000 UNIT/ML DIALYSIS
1000.0000 [IU] | INTRAMUSCULAR | Status: DC | PRN
  Administered 2015-03-30 – 2015-03-31 (×2): 3600 [IU] via INTRAVENOUS_CENTRAL
  Filled 2015-03-29 (×2): qty 1

## 2015-03-29 MED ORDER — DOCUSATE SODIUM 100 MG PO CAPS
200.0000 mg | ORAL_CAPSULE | Freq: Every day | ORAL | Status: DC
  Administered 2015-03-30: 200 mg via ORAL
  Filled 2015-03-29: qty 2

## 2015-03-29 MED ORDER — BENZTROPINE MESYLATE 1 MG PO TABS: 0.5000 mg | ORAL_TABLET | Freq: Every day | ORAL | Status: DC

## 2015-03-29 MED ORDER — MIDAZOLAM HCL 2 MG/2ML IJ SOLN
INTRAMUSCULAR | Status: AC
  Administered 2015-03-29: 2 mg via INTRAVENOUS
  Filled 2015-03-29: qty 2

## 2015-03-29 MED ORDER — ASENAPINE MALEATE 5 MG SL SUBL
10.0000 mg | SUBLINGUAL_TABLET | Freq: Three times a day (TID) | SUBLINGUAL | Status: DC
  Administered 2015-03-30 (×2): 10 mg via SUBLINGUAL
  Filled 2015-03-29 (×4): qty 2

## 2015-03-29 MED ORDER — FERROUS SULFATE 325 (65 FE) MG PO TABS
325.0000 mg | ORAL_TABLET | Freq: Every day | ORAL | Status: DC
  Administered 2015-03-30: 325 mg via ORAL
  Filled 2015-03-29: qty 1

## 2015-03-29 MED ORDER — DIVALPROEX SODIUM 125 MG PO CSDR
750.0000 mg | DELAYED_RELEASE_CAPSULE | Freq: Two times a day (BID) | ORAL | Status: DC
  Administered 2015-03-30: 750 mg via ORAL
  Filled 2015-03-29 (×3): qty 6

## 2015-03-29 MED ORDER — NYSTATIN 100000 UNIT/GM EX CREA
1.0000 "application " | TOPICAL_CREAM | Freq: Two times a day (BID) | CUTANEOUS | Status: DC
  Administered 2015-03-29 – 2015-04-06 (×16): 1 via TOPICAL
  Filled 2015-03-29: qty 15

## 2015-03-29 MED ORDER — PAROXETINE HCL 20 MG PO TABS
20.0000 mg | ORAL_TABLET | Freq: Every day | ORAL | Status: DC
  Administered 2015-03-30: 20 mg via ORAL
  Filled 2015-03-29: qty 1

## 2015-03-29 MED ORDER — ARIPIPRAZOLE 15 MG PO TABS
15.0000 mg | ORAL_TABLET | Freq: Every day | ORAL | Status: DC
  Administered 2015-03-30: 15 mg via ORAL
  Filled 2015-03-29: qty 1

## 2015-03-29 MED ORDER — ALTEPLASE 2 MG IJ SOLR: 2.0000 mg | Freq: Once | INTRAMUSCULAR | Status: DC | PRN

## 2015-03-29 NOTE — Progress Notes (Addendum)
Dialysis Nurse notified of successful placement of trialysis catheter in right femoral vein with no complications. Patient is ready for dialysis. Dialysis nurse acknowledged plan to dialyze tonight.

## 2015-03-29 NOTE — Progress Notes (Signed)
   03/29/15 1800  Clinical Encounter Type  Visited With Patient and family together  Visit Type Spiritual support  Spiritual Encounters  Spiritual Needs Prayer  Stress Factors  Patient Stress Factors Not reviewed  Family Stress Factors Health changes   Status: female 75yrs Lithium toxicity Seemingly developmentally delayed/confused Faith: Christian Family: 2 group home peers, 2 outside caregivers bedside Visit Assessment: The chaplain visited with the patient and her family. The patient seems to be anxious with rapid movements as if she's trying to get out of bed. The caregivers are holding her in bed. The caregiver shared that the patient likes to dance and sing in church.  Pastoral Care: [phone] pager or by online request

## 2015-03-29 NOTE — ED Notes (Signed)
Patient to ER for vomiting. Patient was seen here on Thursday and diagnosed with ataxia, had MRI. Also had swallow study done on Monday. Caregiver states patient was on Topamax, which is what is assumed to have caused ataxia. Patient has been vomiting since night before last. Caregiver states she has had wheezing as well and is concerned she may have aspirated. Caregiver states she was told "food is just sitting in esophagus", but was told not to do pureed food at this time, just finely chopped.

## 2015-03-29 NOTE — Consult Note (Signed)
[facility] CONSULT NOTE    Date: 03/29/2015                  Patient Name:  Hannah Keller  MRN: [id number]  DOB: Oct 08, 1990  Age / Sex: 24 y.o., female         PCP: Domenic Schwab, FNP                 Service Requesting Consult: ED/Dr. Langston Masker                 Reason for Consult: Acute lithium toxicity            History of Present Illness: Patient is a 24 y.o. female with a PMHx of mental retardation, paranoid schizophrenia, seizures, developmental delay, who was admitted to [facility] on 03/29/2015 for evaluation of ataxia, nausea, vomiting, and altered mental status.  The patient is known to be on lithium. The patient had recent increase in lithium approximately 1 month ago. She is currently taking lithium 600 mg by mouth twice a day. The patient lives in a group home. Per her caregiver it appears that she's had relatively good by mouth food and fluid intake. However she is noted to be on hydrochlorothiazide. However the patient has had some nausea and vomiting. We suspect that she developed intravascular volume depletion at some point in time. In addition she has elevated serum sodium of 148. The patient's caregiver reports that she has ataxia at baseline however this is been worse over the past week. Initial lithium level was found to be 4.4. Poison control was contacted by the emergency department and they recommended dialysis. We are in agreement with this assessment.   Medications: Outpatient medications:  (Not in a hospital admission)  Current medications: No current facility-administered medications for this encounter.   Current Outpatient Prescriptions  Medication Sig Dispense Refill  . acetaminophen (TYLENOL) 325 MG tablet Take 325 mg by mouth every 6 (six) hours as needed for mild pain, fever or headache.    . ARIPiprazole (ABILIFY) 15 MG tablet Take 15 mg by mouth daily at 12 noon.    . Asenapine Maleate (SAPHRIS) 10 MG SUBL Place 1 tablet under  the tongue 3 (three) times daily.     . benztropine (COGENTIN) 0.5 MG tablet Take 0.5 mg by mouth at bedtime.    . cholecalciferol (VITAMIN D) 1000 UNITS tablet Take 1,000 Units by mouth 2 (two) times daily.    . clonazePAM (KLONOPIN) 0.5 MG tablet Take 0.5 mg by mouth every 6 (six) hours as needed (for agitation).    . cloNIDine (CATAPRES - DOSED IN MG/24 HR) 0.3 mg/24hr patch Place 0.3 mg onto the skin once a week. Pt applies on Friday.    . divalproex (DEPAKOTE SPRINKLE) 125 MG capsule Take 750 mg by mouth 2 (two) times daily.     Marland Kitchen docusate sodium (COLACE) 100 MG capsule Take 200 mg by mouth daily.     . ferrous sulfate 325 (65 FE) MG tablet Take 325 mg by mouth daily.    . hydrochlorothiazide (HYDRODIURIL) 25 MG tablet Take 25 mg by mouth daily.    Marland Kitchen lithium carbonate (LITHOBID) 300 MG CR tablet Take 600 mg by mouth 2 (two) times daily.     . meclizine (ANTIVERT) 25 MG tablet Take 25 mg by mouth 3 (three) times daily as needed for dizziness.    . medroxyPROGESTERone (DEPO-PROVERA) 150 MG/ML injection Inject 150 mg into the muscle every 3 (three) months.    Marland Kitchen  nystatin cream (MYCOSTATIN) Apply 1 application topically 2 (two) times daily.    . Olopatadine HCl (PATADAY) 0.2 % SOLN Apply 1 drop to eye daily as needed (for allergies).     Marland Kitchen PARoxetine (PAXIL) 20 MG tablet Take 20 mg by mouth daily.    . polyethylene glycol (MIRALAX / GLYCOLAX) packet Take 17 g by mouth every other day.    Marland Kitchen QUEtiapine (SEROQUEL) 50 MG tablet Take 50 mg by mouth 3 (three) times daily.    . solifenacin (VESICARE) 10 MG tablet Take 10 mg by mouth daily.    . valACYclovir (VALTREX) 1000 MG tablet Take 2,000 mg by mouth 2 (two) times daily as needed (for fever blisters).        Allergies: Allergies  Allergen Reactions  . Ritalin [Methylphenidate Hcl] Other (See Comments)    Reaction:  Unknown       Past Medical History: Past Medical History  Diagnosis Date  . Mental retardation     Mild  . Paranoid  schizophrenia   . Seizures   . Developmental delay      Past Surgical History: History reviewed. No pertinent past surgical history.   Family History: No family history on file.   Social History: Social History   Social History  . Marital Status: Single    Spouse Name: N/A  . Number of Children: 0  . Years of Education: n/a   Occupational History  . Disabled    Social History Main Topics  . Smoking status: Never Smoker   . Smokeless tobacco: Never Used  . Alcohol Use: No  . Drug Use: No  . Sexual Activity: Not on file   Other Topics Concern  . Not on file   Social History Narrative   Pt currently resides at [facility].   Caffeine Use: None     Review of Systems: Patient unable to provide ROS due to altered mental status.  Vital Signs: Blood pressure 92/52, pulse 88, temperature 97.5 F (36.4 C), temperature source Oral, resp. rate 18, height 5' (1.524 m), weight 77.111 kg (170 lb), SpO2 97 %.  Weight trends: Filed Weights   03/29/15 1454  Weight: 77.111 kg (170 lb)    Physical Exam: General: NAD, lethargic  Head: Normocephalic, atraumatic.   Eyes: Anicteric, no jaundice, PEERL  Nose: Mucous membranes dry, not inflammed, nonerythematous.  Throat: Oral mucosa dry   Neck: Supple trachea midline  Lungs:  Normal respiratory effort. Clear to auscultation BL without crackles or wheezes.  Heart: RRR. S1 and S2 normal without gallop, murmur, or rubs.  Abdomen:  BS normoactive. Soft, Nondistended, non-tender.  No masses or organomegaly.  Extremities: No pretibial edema.  Neurologic: Lethargic, but arousable, did follow a few simple commands but this was inconsistent  Skin: No visible rashes, scars.    Lab results: Basic Metabolic Panel:  Recent Labs Lab 03/29/15 1604  NA 148*  K 3.5  CL 116*  CO2 28  GLUCOSE 101*  BUN 42*  CREATININE 2.45*  CALCIUM 11.3*    Liver Function Tests:  Recent Labs Lab 03/29/15 1604  AST 37  ALT 24   ALKPHOS 115  BILITOT 0.4  PROT 7.0  ALBUMIN 3.3*    Recent Labs Lab 03/29/15 1604  LIPASE 300*   No results for input(s): AMMONIA in the last 168 hours.  CBC:  Recent Labs Lab 03/29/15 1604  WBC 35.2*  HGB 11.4*  HCT 34.4*  MCV 96.7  PLT 129*    Cardiac  Enzymes: No results for input(s): CKTOTAL, CKMB, CKMBINDEX, TROPONINI in the last 168 hours.  BNP: Invalid input(s): POCBNP  CBG: No results for input(s): GLUCAP in the last 168 hours.  Microbiology: No results found for this or any previous visit.  Coagulation Studies: No results for input(s): LABPROT, INR in the last 72 hours.  Urinalysis:  Recent Labs  03/29/15 1604  COLORURINE YELLOW*  LABSPEC 1.015  PHURINE 5.0  GLUCOSEU NEGATIVE  HGBUR NEGATIVE  BILIRUBINUR NEGATIVE  KETONESUR NEGATIVE  PROTEINUR NEGATIVE  NITRITE NEGATIVE  LEUKOCYTESUR NEGATIVE      Imaging: Dg Chest 2 View  03/29/2015   CLINICAL DATA:  Congestion after vomiting.  EXAM: CHEST  2 VIEW  COMPARISON:  None.  FINDINGS: Marked hypoventilation, limiting sensitivity. No gross asymmetry in aeration. No effusion or air leak. Normal heart size and mediastinal contours.  Stool dilated colon visualized in the upper abdomen.  IMPRESSION: 1. Very limited low volume chest, cannot exclude aspiration pneumonitis. 2. Marked stool retention in the upper abdomen.   Electronically Signed   By: Marnee Spring M.D.   On: 03/29/2015 15:33      Assessment & Plan: Pt is a 24 y.o. female with a PMHx of mental retardation, paranoid schizophrenia, seizures, developmental delay, who was admitted to [facility] on 03/29/2015 for evaluation of ataxia, nausea, vomiting, and altered mental status.   1.  Lithium toxicity R78.89 2.  Acute renal failure. 3.  Hypernatremia. 4.  Hypotension.  Plan:  The patient has clear indication for hemodialysis she has a lithium level of 4.4 and is manifesting toxicity in the setting of acute renal failure. We have discussed the  case with the patient's healthcare power of attorney Mr. Alberteen Spindle. He consented to placement of temporary dialysis catheter as well as hemodialysis treatment after discussion of risks, benefits, and alternatives. Once dialysis catheter is placed we will proceed with hemodialysis for 2 hours with a blood flow rate of 150, dialysate flow rate of 300, and no ultrafiltration. We agree with IV fluid hydration.   We will reassess her mental status in the a.m. after she's had a chance to have dialysis. We will consider further dialysis treatment based upon her mental status tomorrow a.m. Thank you for consultation.

## 2015-03-29 NOTE — ED Provider Notes (Signed)
[facility] Emergency Department Provider Note  ____________________________________________  Time seen: Approximately 340 PM  I have reviewed the triage vital signs and the nursing notes.   HISTORY  Chief Complaint Emesis    HPI Hannah Keller is a 24 y.o. female with a history of mental retardation and paranoid schizophrenia who lives in a group home who is presenting today with 2 days of emesis. She is here with a caretaker and the owner of the group home where she lives. Per the group home owner, the patient is usually able to have a normal conversation but has had decreased conversant this. Having multiple jerking movements. Also continues to have an unsteady gait despite [employer] stopped on her Topamax on August 12. Did have 1 month ago a increase in her Depakote as well as her lithium.Hyperostosis cranial possible on MRI done recently at this hospital. Also a swallow study which shows what sounds like slow transit of food products. No blood in the vomitus. The vomitus appears partially digested food products.   Past Medical History  Diagnosis Date  . Mental retardation     Mild  . Paranoid schizophrenia   . Seizures   . Developmental delay     Patient Active Problem List   Diagnosis Date Noted  . Mental retardation, idiopathic moderate 12/01/2014    History reviewed. No pertinent past surgical history.  Current Outpatient Rx  Name  Route  Sig  Dispense  Refill  . ARIPiprazole (ABILIFY MAINTENA) 400 MG SUSR   Intramuscular   Inject into the muscle every 30 (thirty) days.         . Asenapine Maleate (SAPHRIS) 10 MG SUBL   Sublingual   Place 1 each under the tongue 3 (three) times daily.         . benztropine (COGENTIN) 0.5 MG tablet   Oral   Take 0.5 mg by mouth at bedtime.         . cholecalciferol (VITAMIN D) 1000 UNITS tablet   Oral   Take 1,000 Units by mouth 2 (two) times daily.         . clonazePAM (KLONOPIN) 1 MG tablet    Oral   Take 1 mg by mouth 3 (three) times daily.          . cloNIDine (CATAPRES - DOSED IN MG/24 HR) 0.3 mg/24hr patch   Transdermal   Place 0.3 mg onto the skin once a week.         . divalproex (DEPAKOTE SPRINKLE) 125 MG capsule   Oral   Take 750 mg by mouth at bedtime.         . divalproex (DEPAKOTE) 125 MG DR tablet   Oral   Take 125 mg by mouth every morning. 1 capsule in the morning         . docusate sodium (COLACE) 100 MG capsule   Oral   Take 200 mg by mouth daily.          Marland Kitchen lithium carbonate (LITHOBID) 300 MG CR tablet   Oral   Take 300 mg by mouth every morning.          . lithium carbonate (LITHOBID) 300 MG CR tablet   Oral   Take 600 mg by mouth at bedtime.         . meclizine (ANTIVERT) 32 MG tablet   Oral   Take 1 tablet (32 mg total) by mouth 3 (three) times daily as needed.   30 tablet  0   . medroxyPROGESTERone (DEPO-PROVERA) 150 MG/ML injection   Intramuscular   Inject 150 mg into the muscle every 3 (three) months.         . Olopatadine HCl (PATADAY) 0.2 % SOLN   Ophthalmic   Apply 1 drop to eye daily as needed.         Marland Kitchen PARoxetine (PAXIL) 20 MG tablet   Oral   Take 20 mg by mouth daily.         . polyethylene glycol (MIRALAX / GLYCOLAX) packet   Oral   Take 17 g by mouth daily.         . QUEtiapine (SEROQUEL) 50 MG tablet   Oral   Take 50 mg by mouth 3 (three) times daily.         . solifenacin (VESICARE) 10 MG tablet   Oral   Take 10 mg by mouth daily.         . valACYclovir (VALTREX) 1000 MG tablet   Oral   Take 1,000 mg by mouth 2 (two) times daily as needed (for one day with outbreak fever blister).           Allergies Ritalin  No family history on file.  Social History Social History  Substance Use Topics  . Smoking status: Never Smoker   . Smokeless tobacco: Never Used  . Alcohol Use: No    Review of Systems Constitutional: No fever/chills Eyes: No visual changes. ENT: No sore  throat. Cardiovascular: Denies chest pain. Respiratory: Denies shortness of breath. Gastrointestinal: No diarrhea.  No constipation. Genitourinary: Negative for dysuria. Musculoskeletal: Negative for back pain. Skin: Negative for rash. Neurological: Negative for headaches, focal weakness or numbness.  10-point ROS otherwise negative. However, review of systems confounded by patient still decreased mental status.  ____________________________________________   PHYSICAL EXAM:  VITAL SIGNS: ED Triage Vitals  Enc Vitals Group     BP 03/29/15 1454 115/49 mmHg     Pulse Rate 03/29/15 1454 88     Resp 03/29/15 1454 18     Temp 03/29/15 1454 97.5 F (36.4 C)     Temp Source 03/29/15 1454 Oral     SpO2 03/29/15 1454 97 %     Weight 03/29/15 1454 170 lb (77.111 kg)     Height 03/29/15 1454 5' (1.524 m)     Head Cir --      Peak Flow --      Pain Score --      Pain Loc --      Pain Edu? --      Excl. in GC? --      Constitutional: Restless in the stretcher. Does not appear in any acute distress and has somewhat erratic movements with grunting noises. Eyes:  PERRL. EOMI. Head: Atraumatic. TMs are normal bilaterally. Nose: No congestion/rhinnorhea. Mouth/Throat: Mucous membranes are moist.  Oropharynx non-erythematous. Neck: No stridor.   Cardiovascular: Normal rate, regular rhythm. Grossly normal heart sounds.  Good peripheral circulation. Respiratory: Normal respiratory effort.  No retractions. Lungs CTAB. Gastrointestinal: Soft and nontender. No distention. No abdominal bruits. No CVA tenderness. Musculoskeletal: No lower extremity tenderness nor edema.  No joint effusions. Neurologic:  Does follow simple commands and able to answer yes or no questions.. No gross focal neurologic deficits are appreciated. Skin:  Skin is warm, dry and intact. No rash noted. Psychiatric: Mood and affect are normal. Speech and behavior are normal.  ____________________________________________    LABS (all labs ordered are listed, but only  abnormal results are displayed)  Labs Reviewed  COMPREHENSIVE METABOLIC PANEL - Abnormal; Notable for the following:    Sodium 148 (*)    Chloride 116 (*)    Glucose, Bld 101 (*)    BUN 42 (*)    Creatinine, Ser 2.45 (*)    Calcium 11.3 (*)    Albumin 3.3 (*)    GFR calc non Af Amer 26 (*)    GFR calc Af Amer 31 (*)    Anion gap 4 (*)    All other components within normal limits  CBC - Abnormal; Notable for the following:    WBC 35.2 (*)    RBC 3.56 (*)    Hemoglobin 11.4 (*)    HCT 34.4 (*)    Platelets 129 (*)    All other components within normal limits  URINALYSIS COMPLETEWITH MICROSCOPIC ([facility] ONLY) - Abnormal; Notable for the following:    Color, Urine YELLOW (*)    APPearance CLEAR (*)    Bacteria, UA RARE (*)    All other components within normal limits  LITHIUM LEVEL - Abnormal; Notable for the following:    Lithium Lvl 4.40 (*)    All other components within normal limits  VALPROIC ACID LEVEL - Abnormal; Notable for the following:    Valproic Acid Lvl 110 (*)    All other components within normal limits  LIPASE, BLOOD - Abnormal; Notable for the following:    Lipase 300 (*)    All other components within normal limits  POC URINE PREG, ED   ____________________________________________  EKG ED ECG REPORT I, Arelia Longest, the attending physician, personally viewed and interpreted this ECG.   Date: 03/29/2015  EKG Time: 1747  Rate: 86  Rhythm: normal sinus rhythm  Axis: Normal axis  Intervals:Prolonged QT at 461 QTc.  ST&T Change: T wave inversions in V2, V3 and V5. No ST elevations or depressions.   ____________________________________________  RADIOLOGY  Limited chest x-ray. Marked stool retention the upper abdomen. I personally reviewed these images. ____________________________________________   PROCEDURES  CRITICAL CARE Performed by: Arelia Longest   Total critical care time: 35  minutes  Critical care time was exclusive of separately billable procedures and treating other patients.  Critical care was necessary to treat or prevent imminent or life-threatening deterioration.  Critical care was time spent personally by me on the following activities: development of treatment plan with patient and/or surrogate as well as nursing, discussions with consultants, evaluation of patient's response to treatment, examination of patient, obtaining history from patient or surrogate, ordering and performing treatments and interventions, ordering and review of laboratory studies, ordering and review of radiographic studies, pulse oximetry and re-evaluation of patient's condition.   ____________________________________________   INITIAL IMPRESSION / ASSESSMENT AND PLAN / ED COURSE  Pertinent labs & imaging results that were available during my care of the patient were reviewed by me and considered in my medical decision making (see chart for details).  ----------------------------------------- 5:52 PM on 03/29/2015 -----------------------------------------  Upon hearing patient's lithium level I immediately called the [facility] to discuss the case. I was put in touch with Dr.Beuhler who, after hearing the patient's clinical presentation as well as lab results recommended emergent dialysis. Dr. Cherylann Ratel of renal was consult did will be writing dialysis orders. I also spoke with Dr. Wyn Quaker who agreed to put in a dialysis catheter for the procedure. The patient was signed out to Dr. Cherlynn Kaiser of the medicine service to be  admitted to a monitored bed. At this time the patient continues to be at her altered mental status. Vitals are normal. ____________________________________________   FINAL CLINICAL IMPRESSION(S) / ED DIAGNOSES  Acute renal failure. Acute lithium toxicity. Initial visit.    Myrna Blazer, MD 03/29/15 1754

## 2015-03-29 NOTE — Consult Note (Signed)
[facility] Vascular Consult Note  MRN : [id number]  Hannah Keller is a 24 y.o. (1991-04-10) female who presents with chief complaint of  Chief Complaint  Patient presents with  . Emesis  .  History of Present Illness: Patient is admitted to the hospital with lithium toxicity and has acute renal failure.  The patient has mental retardation and other neurologic issues and can not provide history.  The patient is critically ill with her lithium toxicity. The nephrology service has decided to initiate dialysis at this time, and we are asked to place a temporary dialysis catheter for immediate dialysis use.    Current Facility-Administered Medications  Medication Dose Route Frequency Provider Last Rate Last Dose  . 0.45 % sodium chloride infusion   Intravenous Continuous Munsoor Lateef, MD      . acetaminophen (TYLENOL) tablet 650 mg  650 mg Oral Q6H PRN Houston Siren, MD       Or  . acetaminophen (TYLENOL) suppository 650 mg  650 mg Rectal Q6H PRN Houston Siren, MD      . acetaminophen (TYLENOL) tablet 325 mg  325 mg Oral Q6H PRN Houston Siren, MD      . Melene Muller ON 03/30/2015] ARIPiprazole (ABILIFY) tablet 15 mg  15 mg Oral Q1200 Houston Siren, MD      . Asenapine Maleate SUBL 10 mg  1 tablet Sublingual TID Houston Siren, MD      . benztropine (COGENTIN) tablet 0.5 mg  0.5 mg Oral QHS Houston Siren, MD      . Melene Muller ON 03/30/2015] cholecalciferol (VITAMIN D) tablet 1,000 Units  1,000 Units Oral BID Houston Siren, MD      . cloNIDine (CATAPRES - Dosed in mg/24 hr) patch 0.3 mg  0.3 mg Transdermal Weekly Houston Siren, MD      . Melene Muller ON 03/30/2015] darifenacin (ENABLEX) 24 hr tablet 7.5 mg  7.5 mg Oral Daily Houston Siren, MD      . divalproex (DEPAKOTE SPRINKLE) capsule 750 mg  750 mg Oral BID Houston Siren, MD      . docusate sodium (COLACE) capsule 200 mg  200 mg Oral Daily Houston Siren, MD      . Melene Muller ON 03/30/2015] ferrous sulfate tablet 325 mg   325 mg Oral Daily Houston Siren, MD      . heparin injection 5,000 Units  5,000 Units Subcutaneous 3 times per day Houston Siren, MD      . Melene Muller ON 03/30/2015] Influenza vac split quadrivalent PF (FLUARIX) injection 0.5 mL  0.5 mL Intramuscular Tomorrow-1000 Munsoor Lateef, MD      . meclizine (ANTIVERT) tablet 25 mg  25 mg Oral TID PRN Houston Siren, MD      . nystatin cream (MYCOSTATIN) 1 application  1 application Topical BID Houston Siren, MD      . olopatadine (PATANOL) 0.1 % ophthalmic solution 1 drop  1 drop Both Eyes BID Houston Siren, MD      . ondansetron (ZOFRAN) tablet 4 mg  4 mg Oral Q6H PRN Houston Siren, MD       Or  . ondansetron (ZOFRAN) injection 4 mg  4 mg Intravenous Q6H PRN Houston Siren, MD      . Melene Muller ON 03/30/2015] PARoxetine (PAXIL) tablet 20 mg  20 mg Oral Daily Houston Siren, MD      . Melene Muller ON 03/30/2015] polyethylene glycol (MIRALAX /  GLYCOLAX) packet 17 g  17 g Oral QODAY Houston Siren, MD      . QUEtiapine (SEROQUEL) tablet 50 mg  50 mg Oral TID Houston Siren, MD      . valACYclovir (VALTREX) tablet 2,000 mg  2,000 mg Oral BID PRN Houston Siren, MD        Past Medical History  Diagnosis Date  . Mental retardation     Mild  . Paranoid schizophrenia   . Seizures   . Developmental delay     History reviewed. No pertinent past surgical history.  Social History Social History  Substance Use Topics  . Smoking status: Never Smoker   . Smokeless tobacco: Never Used  . Alcohol Use: No  lives in a group home  Family History No recorded history of bleeding disorders, clotting disorders, or autoimmune diseases.  She can not provide history  Allergies  Allergen Reactions  . Ritalin [Methylphenidate Hcl] Other (See Comments)    Reaction:  Unknown      REVIEW OF SYSTEMS (Negative unless checked)  Unable to obtain the review of systems due to the patient's severe systemic illness and altered mental status. I do not know if the poor  mental status is baseline, but she really does not answer any questions.      Physical Examination  Filed Vitals:   03/29/15 1454 03/29/15 1800 03/29/15 1903 03/29/15 2022  BP: 115/49 104/57 92/52 92/78   Pulse: 88   80  Temp: 97.5 F (36.4 C)   98.2 F (36.8 C)  TempSrc: Oral   Oral  Resp: 18   16  Height: 5' (1.524 m)     Weight: 77.111 kg (170 lb)     SpO2: 97%   97%   Body mass index is 33.2 kg/(m^2). Gen: WD/WN Head: [place]/AT, No temporalis wasting Ear/Nose/Throat: Hearing grossly intact, nares w/o erythema or drainage,  Eyes: PERRLA, EOMI.  Neck: Supple, no nuchal rigidity.  No bruit or JVD.  Pulmonary:  Good air movement, clear to auscultation bilaterally.  Cardiac: RRR, normal S1, S2, no Murmurs, rubs or gallops.  Gastrointestinal: soft, non-tender/non-distended. No guarding/reflex.  Musculoskeletal: M/S 5/5 throughout. Some rigidity  Extremities without ischemic changes. . 1+ Edema in the lower extremities bilaterally Neurologic: some rigidity, does not really follow commands.  Responds to pain Psychiatric: combative, basically non-verbal Dermatologic: No rashes or ulcers noted.   Lymph : No Cervical, Axillary, or Inguinal lymphadenopathy.      CBC Lab Results  Component Value Date   WBC 35.2* 03/29/2015   HGB 11.4* 03/29/2015   HCT 34.4* 03/29/2015   MCV 96.7 03/29/2015   PLT 129* 03/29/2015    BMET    Component Value Date/Time   NA 148* 03/29/2015 1604   NA 138 03/23/2012 1502   K 3.5 03/29/2015 1604   K 4.3 03/23/2012 1502   CL 116* 03/29/2015 1604   CL 107 03/23/2012 1502   CO2 28 03/29/2015 1604   CO2 25 03/23/2012 1502   GLUCOSE 101* 03/29/2015 1604   GLUCOSE 132* 03/23/2012 1502   BUN 42* 03/29/2015 1604   BUN 13 03/23/2012 1502   CREATININE 2.45* 03/29/2015 1604   CREATININE 1.16 03/23/2012 1502   CALCIUM 11.3* 03/29/2015 1604   CALCIUM 9.1 03/23/2012 1502   GFRNONAA 26* 03/29/2015 1604   GFRNONAA >60 03/23/2012 1502   GFRAA 31*  03/29/2015 1604   GFRAA >60 03/23/2012 1502   Estimated Creatinine Clearance: 32.5 mL/min (by C-G formula based on Cr  of 2.45).  COAG No results found for: INR, PROTIME  Radiology Dg Chest 2 View  03/29/2015   CLINICAL DATA:  Congestion after vomiting.  EXAM: CHEST  2 VIEW  COMPARISON:  None.  FINDINGS: Marked hypoventilation, limiting sensitivity. No gross asymmetry in aeration. No effusion or air leak. Normal heart size and mediastinal contours.  Stool dilated colon visualized in the upper abdomen.  IMPRESSION: 1. Very limited low volume chest, cannot exclude aspiration pneumonitis. 2. Marked stool retention in the upper abdomen.   Electronically Signed   By: Marnee Spring M.D.   On: 03/29/2015 15:33   Mr Brain Wo Contrast  03/23/2015   CLINICAL DATA:  24 year old female recently diagnosed with ear infection status post antibiotics but continued ataxia, unsteady gait. Initial encounter.  EXAM: MRI HEAD WITHOUT CONTRAST  TECHNIQUE: Multiplanar, multiecho pulse sequences of the brain and surrounding structures were obtained without intravenous contrast.  COMPARISON:  [facility] Brain MRI 12/31/2012  FINDINGS: Rapid scanning technique employed.  Chronic widespread hyperostosis of the skull. Stable cerebral volume. Major intracranial vascular flow voids appear stable. No restricted diffusion or evidence of acute infarction. No ventriculomegaly (stable mild colpocephaly). No midline shift or intracranial mass lesion. No acute or chronic intracranial blood products identified. No encephalomalacia identified. Wallace Cullens and white matter signal appears stable. Negative pituitary and cervicomedullary junction. Stable visualized cervical spine.  There is new right ethmoid air cell fluid/opacification. Mild bilateral maxillary sinus mucosal thickening is new. Other Visualized paranasal sinuses and mastoids are clear. No tympanic cavity fluid. Chronic diminutive appearance of the IAC.  Orbits soft tissues  appear stable and within normal limits. Scalp soft tissues appear normal.  IMPRESSION: 1. Stable noncontrast MRI appearance of the brain since 2014. 2. Mild-to-moderate paranasal sinus inflammation primarily at the right ethmoids. Mastoids and tympanic cavities are clear. 3. 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 If there are multiple cranial neuropathies consider cranial nerve entrapment such as in hyperostosis cranialis interna.   Electronically Signed   By: Odessa Fleming M.D.   On: 03/23/2015 14:03      Assessment/Plan 1. ARF 2. Lithium toxicity. Needs dialysis.   3. Mental retardation/schizophrenia.  Combative.  Will give versed to place line  We will proceed with temporary dialysis catheter placement at this time.  Risks and benefits discussed with patient's family, and the catheter will be placed to allow immediate initiation of dialysis.  If the patient's renal function does not improve throughout the hospital course, we will be happy to place a tunneled dialysis catheter for long term use prior to discharge.     Roselin Wiemann, MD  03/29/2015 8:56 PM

## 2015-03-29 NOTE — H&P (Signed)
[facility] [place] [facility]   PATIENT NAME: Hannah Keller    MR#:  [id number]  DATE OF BIRTH:  Sep 05, 1990  DATE OF ADMISSION:  03/29/2015  PRIMARY CARE PHYSICIAN: Domenic Schwab, FNP   REQUESTING/REFERRING PHYSICIAN: Dr. Gladstone Pih  CHIEF COMPLAINT:   Chief Complaint  Patient presents with  . Emesis   ataxia, nausea, vomiting.  HISTORY OF PRESENT ILLNESS:  Hannah Keller  is a 24 y.o. female with a known history of mental retardation, schizophrenia, developmental delay, history of seizures, who presents to the hospital due to ataxic gait and altered mental status. Patient apparently was seen at the hospital a few days back for a swallow evaluation and was noted to have esophageal dysmotility. Patient has been eating and drinking well but has also been having some vomitus of partially digested food as per the caretaker. Patient resides at a group home and therefore most of the history obtained from the caretaker bedside. As per the caretaker patient at baseline is able to walk without any ataxic gait and communicate with the well but over the past week she has been more lethargic and confused and has had imbalance issues. She has been referred to see a neurologist as an outpatient but has not been seen by anyone yet. Since his symptoms were not improving she came to the ER for further evaluation and was noted to have a significantly elevated lithium level at 4. As per the caretaker patient's lithium level was increased from 600 mg in the morning and 1200 mg at bedtime to 1200 mg twice daily. This happened about a month ago and patient has not had any follow-up lithium level since her dose was changed. She now presents with the above symptoms.  PAST MEDICAL HISTORY:   Past Medical History  Diagnosis Date  . Mental retardation     Mild  . Paranoid schizophrenia   . Seizures   . Developmental delay     PAST SURGICAL HISTORY:  History reviewed.  No pertinent past surgical history.  SOCIAL HISTORY:   Social History  Substance Use Topics  . Smoking status: Never Smoker   . Smokeless tobacco: Never Used  . Alcohol Use: No    FAMILY HISTORY:  No family history on file.  DRUG ALLERGIES:   Allergies  Allergen Reactions  . Ritalin [Methylphenidate Hcl] Other (See Comments)    Reaction:  Unknown     REVIEW OF SYSTEMS:   Review of Systems  Unable to perform ROS  the patient's encephalopathy/mental status  MEDICATIONS AT HOME:   Prior to Admission medications   Medication Sig Start Date End Date Taking? Authorizing Provider  acetaminophen (TYLENOL) 325 MG tablet Take 325 mg by mouth every 6 (six) hours as needed for mild pain, fever or headache.   Yes Historical Provider, MD  ARIPiprazole (ABILIFY) 15 MG tablet Take 15 mg by mouth daily at 12 noon.   Yes Historical Provider, MD  Asenapine Maleate (SAPHRIS) 10 MG SUBL Place 1 tablet under the tongue 3 (three) times daily.    Yes Historical Provider, MD  benztropine (COGENTIN) 0.5 MG tablet Take 0.5 mg by mouth at bedtime.   Yes Historical Provider, MD  cholecalciferol (VITAMIN D) 1000 UNITS tablet Take 1,000 Units by mouth 2 (two) times daily.   Yes Historical Provider, MD  clonazePAM (KLONOPIN) 0.5 MG tablet Take 0.5 mg by mouth every 6 (six) hours as needed (for agitation).   Yes Historical Provider, MD  cloNIDine (CATAPRES -  DOSED IN MG/24 HR) 0.3 mg/24hr patch Place 0.3 mg onto the skin once a week. Pt applies on Friday.   Yes Historical Provider, MD  divalproex (DEPAKOTE SPRINKLE) 125 MG capsule Take 750 mg by mouth 2 (two) times daily.    Yes Historical Provider, MD  docusate sodium (COLACE) 100 MG capsule Take 200 mg by mouth daily.    Yes Historical Provider, MD  ferrous sulfate 325 (65 FE) MG tablet Take 325 mg by mouth daily.   Yes Historical Provider, MD  hydrochlorothiazide (HYDRODIURIL) 25 MG tablet Take 25 mg by mouth daily.   Yes Historical Provider, MD   lithium carbonate (LITHOBID) 300 MG CR tablet Take 600 mg by mouth 2 (two) times daily.    Yes Historical Provider, MD  meclizine (ANTIVERT) 25 MG tablet Take 25 mg by mouth 3 (three) times daily as needed for dizziness.   Yes Historical Provider, MD  medroxyPROGESTERone (DEPO-PROVERA) 150 MG/ML injection Inject 150 mg into the muscle every 3 (three) months.   Yes Historical Provider, MD  nystatin cream (MYCOSTATIN) Apply 1 application topically 2 (two) times daily.   Yes Historical Provider, MD  Olopatadine HCl (PATADAY) 0.2 % SOLN Apply 1 drop to eye daily as needed (for allergies).    Yes Historical Provider, MD  PARoxetine (PAXIL) 20 MG tablet Take 20 mg by mouth daily.   Yes Historical Provider, MD  polyethylene glycol (MIRALAX / GLYCOLAX) packet Take 17 g by mouth every other day.   Yes Historical Provider, MD  QUEtiapine (SEROQUEL) 50 MG tablet Take 50 mg by mouth 3 (three) times daily.   Yes Historical Provider, MD  solifenacin (VESICARE) 10 MG tablet Take 10 mg by mouth daily.   Yes Historical Provider, MD  valACYclovir (VALTREX) 1000 MG tablet Take 2,000 mg by mouth 2 (two) times daily as needed (for fever blisters).   Yes Historical Provider, MD      VITAL SIGNS:  Blood pressure 104/57, pulse 88, temperature 97.5 F (36.4 C), temperature source Oral, resp. rate 18, height 5' (1.524 m), weight 77.111 kg (170 lb), SpO2 97 %.  PHYSICAL EXAMINATION:  Physical Exam  GENERAL:  24 y.o.-year-old patient lying in the bed with myoclonic jerks, encephalopathic.  EYES: Pupils equal, round, reactive to light and accommodation. No scleral icterus. Extraocular muscles intact. nystagmus noted HEENT: Head atraumatic, normocephalic. Oropharynx and nasopharynx clear. No oropharyngeal erythema, moist oral mucosa  NECK:  Supple, no jugular venous distention. No thyroid enlargement, no tenderness.  LUNGS: Normal breath sounds bilaterally, no wheezing, rales, rhonchi. No use of accessory muscles of  respiration.  CARDIOVASCULAR: S1, S2 RRR. No murmurs, rubs, gallops, clicks.  ABDOMEN: Soft, nontender, nondistended. Bowel sounds present. No organomegaly or mass.  EXTREMITIES: No pedal edema, cyanosis, or clubbing. + 2 pedal & radial pulses b/l.   NEUROLOGIC: Cranial nerves II through XII are intact. No focal Motor or sensory deficits appreciated b/l.  Positive myoclonic jerks and patient moves all extremities spontaneously. PSYCHIATRIC: The patient is alert and oriented x 1.  SKIN: No obvious rash, lesion, or ulcer.   LABORATORY PANEL:   CBC  Recent Labs Lab 03/29/15 1604  WBC 35.2*  HGB 11.4*  HCT 34.4*  PLT 129*   ------------------------------------------------------------------------------------------------------------------  Chemistries   Recent Labs Lab 03/29/15 1604  NA 148*  K 3.5  CL 116*  CO2 28  GLUCOSE 101*  BUN 42*  CREATININE 2.45*  CALCIUM 11.3*  AST 37  ALT 24  ALKPHOS 115  BILITOT 0.4   ------------------------------------------------------------------------------------------------------------------  Cardiac Enzymes No results for input(s): TROPONINI in the last 168 hours. ------------------------------------------------------------------------------------------------------------------  RADIOLOGY:  Dg Chest 2 View  03/29/2015   CLINICAL DATA:  Congestion after vomiting.  EXAM: CHEST  2 VIEW  COMPARISON:  None.  FINDINGS: Marked hypoventilation, limiting sensitivity. No gross asymmetry in aeration. No effusion or air leak. Normal heart size and mediastinal contours.  Stool dilated colon visualized in the upper abdomen.  IMPRESSION: 1. Very limited low volume chest, cannot exclude aspiration pneumonitis. 2. Marked stool retention in the upper abdomen.   Electronically Signed   By: Marnee Spring M.D.   On: 03/29/2015 15:33     IMPRESSION AND PLAN:   24 year old female with past medical history of mental retardation, paranoid schizophrenia,  hypertension, history of seizures, who presents to the hospital due to ataxic gait, altered mental status and noted to have lithium toxicity.  #1 altered mental status/encephalopathy-this is likely metabolic in nature and related to lithium toxicity. -Patient will get urgent hemodialysis to improve her lithium level and we'll follow mental status.  #2 lithium toxicity-this is likely the setting of patient's increasing dose and also probably underlying acute renal failure. -Patient is going to have urgent hemodialysis. We will consult nephrology, vascular surgery. -I will also consult psychiatry to help in management once her lithium levels improved. -We'll continue to follow lithium levels.  #3 ataxia-this is likely related to the patient's lithium toxicity. Unlikely dose of acute neurologic event. -We'll follow her clinically once a lithium level improves.  #4 acute renal failure-this is likely in setting of dehydration and poor by mouth intake. -We'll hydrate the patient with IV fluids and follow BUN and creatinine and urine output. Hold HCTZ.  #5 leukocytosis-this is likely stress mediated from lithium toxicity. There is no evidence of acute infectious source. -I will follow white cell count and hold off on antibiotic therapy at this point.  #6 hypertension-continue clonidine patch  #7 history of seizures-no evidence of acute seizure type activity. Continue Depakote.   All the records are reviewed and case discussed with ED provider. Management plans discussed with the patient, family and they are in agreement.  CODE STATUS: Full  TOTAL CRITICAL CARE TIME TAKING CARE OF THIS PATIENT: 55 minutes.    Houston Siren M.D on 03/29/2015 at 6:36 PM  Between 7am to 6pm - Pager - [phone]  After 6pm go to [email or web address] - password [email or web address] [facility]  [facility]  Office  [phone]  CC: Primary care physician; Domenic Schwab, FNP

## 2015-03-29 NOTE — Op Note (Signed)
  OPERATIVE NOTE   PROCEDURE: 1. Ultrasound guidance for vascular access right femoral vein 2. Placement of a 30 cm long triple-lumen type dialysis catheter right femoral vein  PRE-OPERATIVE DIAGNOSIS: 1. Acute renal failure 2. Lithium toxicity 3. Mental retardation  POST-OPERATIVE DIAGNOSIS: Same  SURGEON: Festus Barren, MD  ASSISTANT(S): None  ANESTHESIA: local  ESTIMATED BLOOD LOSS: Minimal   FINDING(S): 1. None  SPECIMEN(S): None  INDICATIONS:  Patient is a 24 year old female with a history of mental retardation on psychotropic medications who presents with  lithium toxicity and acute renal failure.  Risks and benefits were discussed, and informed consent was obtained from the family..  DESCRIPTION: After obtaining full informed written consent, the patient was laid flat in the bed. The right groin was sterilely prepped and draped in a sterile surgical field was created. The right femoral vein was visualized with ultrasound and found to be widely patent. It was then accessed under direct guidance without difficulty with a Seldinger needle and a permanent image was recorded. A J-wire was then placed. After skin nick and dilatation, a 30 cm long triple-lumen type dialysis catheter was placed over the wire and the wire was removed. The lumens withdrew dark red nonpulsatile blood and flushed easily with sterile saline. The catheter was secured to the skin with 3 nylon sutures. Sterile dressing was placed.  COMPLICATIONS: None  CONDITION: Stable  Nishawn Rotan 03/29/2015 9:26 PM

## 2015-03-29 NOTE — Consult Note (Signed)
  Psychiatry: Brief first encounter consult note with this 24 year old woman with a history of developmental disability and behavior problems. She is brought to the emergency room by her caretakers at her group home. History is of approximately one week of altered mental status with a decrease in her ability to vocalize and converse and increasing ataxia. Medication changes that have been noted recently include an increase in her lithium dose from a total of 1800 mg a day to a total of 2400 mg a day approximately one month ago. She also has recently started Topamax but according to the caretakers her dose is currently only 50 mg a day. No other identified medication changes. No evidence to suggest patient could've independently taken any extra medicine.  Workup so far most obviously remarkable for a lithium level of 4.4. Creatinine is up to over 2.4 and increased from her last known creatinine which was done in May of this year. No lithium level appears to of been done at her visit in May. I also note that her lipase is elevated today.  To exam the patient is unable to stand, unable to respond coherently. Shaking all over. Moves in a delirious and confused manner. I note that her right eye is also appearing somewhat swollen and injected. Caretakers note that the patient has not had any difficulty with urination. Her urine analysis today is unremarkable and does not appear infected.  Case briefly discussed with hospitalist. Patient has current symptoms that could all be due to her elevated lithium level although it would not preclude workup of other possible etiologies. Lithium level of 4 is dangerous and I would agree with aggressive means to try and decrease it. I would suggest consulting nephrology about this and considering the possibility of dialysis. I suggest lithium levels be checked again prior to dialysis and then shortly after dialysis and at least daily thereafter depending on the improvement. I  would certainly withhold the hydrochlorothiazide in her orders which would be one possible etiology for an increased level of lithium. She does not appear to be on non-steroidal anti-inflammatory's.  I will continue to follow-up tomorrow.  Delirium due to lithium toxicity, developmental disability

## 2015-03-29 NOTE — ED Notes (Signed)
POCT NEGATIVE  

## 2015-03-29 NOTE — ED Notes (Signed)
Nephrologist here to see patient and caregiver

## 2015-03-30 DIAGNOSIS — T56891A Toxic effect of other metals, accidental (unintentional), initial encounter: Secondary | ICD-10-CM

## 2015-03-30 DIAGNOSIS — R410 Disorientation, unspecified: Secondary | ICD-10-CM

## 2015-03-30 LAB — BASIC METABOLIC PANEL
ANION GAP: 5 (ref 5–15)
BUN: 29 mg/dL — ABNORMAL HIGH (ref 6–20)
CALCIUM: 9 mg/dL (ref 8.9–10.3)
CO2: 30 mmol/L (ref 22–32)
CREATININE: 1.72 mg/dL — AB (ref 0.44–1.00)
Chloride: 106 mmol/L (ref 101–111)
GFR, EST AFRICAN AMERICAN: 47 mL/min — AB (ref >60)
GFR, EST NON AFRICAN AMERICAN: 41 mL/min — AB (ref >60)
GLUCOSE: 134 mg/dL — AB (ref 65–99)
Potassium: 2.9 mmol/L — CL (ref 3.5–5.1)
Sodium: 141 mmol/L (ref 135–145)

## 2015-03-30 LAB — PHOSPHORUS: Phosphorus: 3.6 mg/dL (ref 2.5–4.6)

## 2015-03-30 LAB — GLUCOSE, CAPILLARY: GLUCOSE-CAPILLARY: 76 mg/dL (ref 65–99)

## 2015-03-30 LAB — LITHIUM LEVEL
LITHIUM LVL: 1.8 mmol/L — AB (ref 0.60–1.20)
Lithium Lvl: 2.9 mmol/L (ref 0.60–1.20)

## 2015-03-30 LAB — CBC
HCT: 29.9 % — ABNORMAL LOW (ref 35.0–47.0)
HEMOGLOBIN: 9.8 g/dL — AB (ref 12.0–16.0)
MCH: 31.1 pg (ref 26.0–34.0)
MCHC: 32.7 g/dL (ref 32.0–36.0)
MCV: 95 fL (ref 80.0–100.0)
PLATELETS: 86 10*3/uL — AB (ref 150–440)
RBC: 3.15 MIL/uL — ABNORMAL LOW (ref 3.80–5.20)
RDW: 13.8 % (ref 11.5–14.5)
WBC: 29.7 10*3/uL — ABNORMAL HIGH (ref 3.6–11.0)

## 2015-03-30 LAB — POTASSIUM: Potassium: 4.1 mmol/L (ref 3.5–5.1)

## 2015-03-30 LAB — LACTIC ACID, PLASMA: Lactic Acid, Venous: 0.9 mmol/L (ref 0.5–2.0)

## 2015-03-30 LAB — MAGNESIUM: Magnesium: 2.3 mg/dL (ref 1.7–2.4)

## 2015-03-30 MED ORDER — HEPARIN SODIUM (PORCINE) 1000 UNIT/ML IJ SOLN
3600.0000 [IU] | Freq: Once | INTRAMUSCULAR | Status: AC
  Administered 2015-03-30: 3600 [IU] via INTRAVENOUS
  Filled 2015-03-30: qty 1

## 2015-03-30 MED ORDER — INFLUENZA VAC SPLIT QUAD 0.5 ML IM SUSY
0.5000 mL | PREFILLED_SYRINGE | INTRAMUSCULAR | Status: AC
  Administered 2015-03-31: 0.5 mL via INTRAMUSCULAR
  Filled 2015-03-30: qty 0.5

## 2015-03-30 MED ORDER — POTASSIUM CHLORIDE 10 MEQ/100ML IV SOLN
10.0000 meq | INTRAVENOUS | Status: DC
  Filled 2015-03-30 (×4): qty 100

## 2015-03-30 MED ORDER — LEVOFLOXACIN IN D5W 500 MG/100ML IV SOLN
500.0000 mg | Freq: Every day | INTRAVENOUS | Status: DC
  Administered 2015-03-30 – 2015-04-05 (×7): 500 mg via INTRAVENOUS
  Filled 2015-03-30 (×8): qty 100

## 2015-03-30 MED ORDER — POTASSIUM CHLORIDE 10 MEQ/100ML IV SOLN
10.0000 meq | INTRAVENOUS | Status: AC
  Administered 2015-03-30 (×6): 10 meq via INTRAVENOUS
  Filled 2015-03-30 (×6): qty 100

## 2015-03-30 NOTE — Progress Notes (Signed)
Ativan given for anxiety and agitation per order.

## 2015-03-30 NOTE — Progress Notes (Addendum)
[facility] [place] at [facility]                                                                                                                                                                                            Patient Demographics   Hannah Keller, is a 24 y.o. female, DOB - 07/04/1991, ZOX:[id number]  Admit date - 03/29/2015   Admitting Physician Houston Siren, MD  Outpatient Primary MD for the patient is Domenic Schwab, FNP   LOS - 1  Subjective: Patient currently agitated. Had to receive Ativan. Was dialyzed yesterday lithium level continues to be elevated.     Review of Systems:   Due to agitation and unable to provide any history  Vitals:   Filed Vitals:   03/30/15 0800 03/30/15 0900 03/30/15 1000 03/30/15 1100  BP: 95/49 96/52 93/68  98/57  Pulse: 77 92 90 80  Temp:      TempSrc:      Resp: 24 27 32 27  Height:      Weight:      SpO2: 97% 86% 99% 98%    Wt Readings from Last 3 Encounters:  03/29/15 77.111 kg (170 lb)  03/23/15 77.111 kg (170 lb)  12/13/14 77.565 kg (171 lb)     Intake/Output Summary (Last 24 hours) at 03/30/15 1236 Last data filed at 03/30/15 1000  Gross per 24 hour  Intake   1925 ml  Output    250 ml  Net   1675 ml    Physical Exam:   GENERAL: Critically ill-appearing  HEAD, EYES, EARS, NOSE AND THROAT: Atraumatic, normocephalic. . Pupils equal and reactive to light. Sclerae anicteric. No conjunctival injection. No oro-pharyngeal erythema.  NECK: Supple. There is no jugular venous distention. No bruits, no lymphadenopathy, no thyromegaly.  HEART: Regular rate and rhythm,. No murmurs, no rubs, no clicks.  LUNGS: Clear to auscultation bilaterally. No rales or rhonchi. No wheezes.  ABDOMEN: Soft, flat, nontender, nondistended. Has good bowel sounds. No hepatosplenomegaly appreciated.  EXTREMITIES: No evidence of any cyanosis, clubbing, or peripheral edema.  +2 pedal and radial pulses bilaterally.   NEUROLOGIC: Confused and agitated SKIN: Moist and warm with no rashes appreciated.  Psych: Confused and agitated LN: No inguinal LN enlargement    Antibiotics   Anti-infectives    Start     Dose/Rate Route Frequency Ordered Stop   03/29/15 1950  valACYclovir (VALTREX) tablet 2,000 mg     2,000 mg Oral 2 times daily PRN 03/29/15 1952        Medications   Scheduled Meds: . ARIPiprazole  15 mg Oral Q1200  .  asenapine  10 mg Sublingual TID  . benztropine  0.5 mg Oral QHS  . cholecalciferol  1,000 Units Oral BID  . darifenacin  7.5 mg Oral Daily  . divalproex  750 mg Oral BID  . docusate sodium  200 mg Oral Daily  . ferrous sulfate  325 mg Oral Daily  . heparin  5,000 Units Subcutaneous 3 times per day  . [START ON 03/31/2015] Influenza vac split quadrivalent PF  0.5 mL Intramuscular Tomorrow-1000  . nystatin cream  1 application Topical BID  . olopatadine  1 drop Both Eyes BID  . PARoxetine  20 mg Oral Daily  . polyethylene glycol  17 g Oral QODAY  . potassium chloride  10 mEq Intravenous Q1 Hr x 6  . QUEtiapine  50 mg Oral TID   Continuous Infusions: . sodium chloride 125 mL/hr at 03/30/15 0620   PRN Meds:.sodium chloride, sodium chloride, acetaminophen **OR** acetaminophen, acetaminophen, alteplase, feeding supplement (NEPRO CARB STEADY), heparin, lidocaine (PF), lidocaine-prilocaine, LORazepam, meclizine, ondansetron **OR** ondansetron (ZOFRAN) IV, pentafluoroprop-tetrafluoroeth, valACYclovir   Data Review:   Micro Results No results found for this or any previous visit (from the past 240 hour(s)).  Radiology Reports Dg Chest 2 View  03/29/2015   CLINICAL DATA:  Congestion after vomiting.  EXAM: CHEST  2 VIEW  COMPARISON:  None.  FINDINGS: Marked hypoventilation, limiting sensitivity. No gross asymmetry in aeration. No effusion or air leak. Normal heart size and mediastinal contours.  Stool dilated colon visualized in the upper abdomen.  IMPRESSION: 1. Very limited low  volume chest, cannot exclude aspiration pneumonitis. 2. Marked stool retention in the upper abdomen.   Electronically Signed   By: Marnee Spring M.D.   On: 03/29/2015 15:33   Mr Brain Wo Contrast  03/23/2015   CLINICAL DATA:  24 year old female recently diagnosed with ear infection status post antibiotics but continued ataxia, unsteady gait. Initial encounter.  EXAM: MRI HEAD WITHOUT CONTRAST  TECHNIQUE: Multiplanar, multiecho pulse sequences of the brain and surrounding structures were obtained without intravenous contrast.  COMPARISON:  [facility] Brain MRI 12/31/2012  FINDINGS: Rapid scanning technique employed.  Chronic widespread hyperostosis of the skull. Stable cerebral volume. Major intracranial vascular flow voids appear stable. No restricted diffusion or evidence of acute infarction. No ventriculomegaly (stable mild colpocephaly). No midline shift or intracranial mass lesion. No acute or chronic intracranial blood products identified. No encephalomalacia identified. Wallace Cullens and white matter signal appears stable. Negative pituitary and cervicomedullary junction. Stable visualized cervical spine.  There is new right ethmoid air cell fluid/opacification. Mild bilateral maxillary sinus mucosal thickening is new. Other Visualized paranasal sinuses and mastoids are clear. No tympanic cavity fluid. Chronic diminutive appearance of the IAC.  Orbits soft tissues appear stable and within normal limits. Scalp soft tissues appear normal.  IMPRESSION: 1. Stable noncontrast MRI appearance of the brain since 2014. 2. Mild-to-moderate paranasal sinus inflammation primarily at the right ethmoids. Mastoids and tympanic cavities are clear. 3. 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 If there are multiple cranial neuropathies consider cranial nerve entrapment such as in hyperostosis cranialis interna.   Electronically Signed   By: Odessa Fleming M.D.   On: 03/23/2015 14:03     CBC  Recent  Labs Lab 03/29/15 1604 03/30/15 0536  WBC 35.2* 29.7*  HGB 11.4* 9.8*  HCT 34.4* 29.9*  PLT 129* 86*  MCV 96.7 95.0  MCH 32.0 31.1  MCHC 33.1 32.7  RDW 14.1 13.8    Chemistries   Recent Labs  Lab 03/29/15 1604 03/30/15 0536  NA 148* 141  K 3.5 2.9*  CL 116* 106  CO2 28 30  GLUCOSE 101* 134*  BUN 42* 29*  CREATININE 2.45* 1.72*  CALCIUM 11.3* 9.0  MG  --  2.3  AST 37  --   ALT 24  --   ALKPHOS 115  --   BILITOT 0.4  --    ------------------------------------------------------------------------------------------------------------------ estimated creatinine clearance is 46.3 mL/min (by C-G formula based on Cr of 1.72). ------------------------------------------------------------------------------------------------------------------ No results for input(s): HGBA1C in the last 72 hours. ------------------------------------------------------------------------------------------------------------------ No results for input(s): CHOL, HDL, LDLCALC, TRIG, CHOLHDL, LDLDIRECT in the last 72 hours. ------------------------------------------------------------------------------------------------------------------ No results for input(s): TSH, T4TOTAL, T3FREE, THYROIDAB in the last 72 hours.  Invalid input(s): FREET3 ------------------------------------------------------------------------------------------------------------------ No results for input(s): VITAMINB12, FOLATE, FERRITIN, TIBC, IRON, RETICCTPCT in the last 72 hours.  Coagulation profile No results for input(s): INR, PROTIME in the last 168 hours.  No results for input(s): DDIMER in the last 72 hours.  Cardiac Enzymes No results for input(s): CKMB, TROPONINI, MYOGLOBIN in the last 168 hours.  Invalid input(s): CK ------------------------------------------------------------------------------------------------------------------ Invalid input(s): POCBNP    Assessment & Plan   24 year old female with past medical  history of mental retardation, paranoid schizophrenia, hypertension, history of seizures, who presents to the hospital due to ataxic gait, altered mental status and noted to have lithium toxicity.  #1 altered mental status/encephalopathy-this is likely metabolic in nature and related to lithium toxicity. Lithium level still elevated plan for another hemodialysis today.  #2 lithium toxicity-this is likely the setting of patient's increasing dose and also probably underlying acute renal failure. Psychiatry consult for him further future recommended  #3 ataxia-this is likely related to the patient's lithium toxicity. Unlikely dose of acute neurologic event.  #4 acute renal failure-this is likely in setting of dehydration and poor by mouth intake. -We'll hydrate the patient with IV fluids and follow BUN and creatinine and urine output. Hold HCTZ.  #5 leukocytosis- due to altered mental status I will empirically place patient on Levaquin.  #6 hypertension low-pressure lower earlier discontinue clonidine patch   #7 history of seizures-no evidence of acute seizure type activity. Continue Depakote  #8 severe hypok repalce k  #9 severe constipation on xray, once awake can give meds to help with bm    Code Status Orders        Start     Ordered   03/29/15 1951  Full code   Continuous     03/29/15 1952           Consults  nephrology     Lab Results  Component Value Date   PLT 86* 03/30/2015     Time Spent in minutes   45 minutes of critical care    Auburn Bilberry M.D on 03/30/2015 at 12:36 PM  Between 7am to 6pm - Pager - [phone]  After 6pm go to [email or web address] - password [email or web address] [facility]  [facility]   Office  [phone]

## 2015-03-30 NOTE — Consult Note (Signed)
[facility] Consult   Reason for Consult:  Consult and follow-up for this 24 year old woman with mental retardation and chronic behavior problems who presented to the hospital with lithium toxicity Referring Physician:  Verdell Carmine Patient Identification: Hannah Keller MRN:  [id number] Principal Diagnosis: Lithium toxicity Diagnosis:   Patient Active Problem List   Diagnosis Date Noted  . Lithium toxicity [T56.891A] 03/29/2015  . 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 female patient admitted with patient is not able to offer any history at this time. Unclear how uncomfortable she might be.Marland Kitchen  HPI:  Follow-up on yesterday's note. This 24 year old woman was brought in by the staff at her family care home. They described what sounded like a week or 2 of worsening mental state with less verbal communication than normal and gradually increasing trouble walking to the point where she was not able to ambulate at all. There had not been any known changes to her medication in the last few weeks. A little over a month ago apparently she did have an increase in her prescribed lithium dose by 33% total. Also had recently started a modest dose of topiramate.  Patient has chronic intellectual disability. Probably in the moderate range. Not entirely clear to me from how the caretakers describe it but apparently she is able to talk somewhat and do some self-care at least when she is at her baseline. She's been on multiple medications suggesting diagnosis of other mental health conditions. Probably chronic behavior problems. Diagnosis of schizophrenia is offered here. Not entirely sure as we don't have a lot of other past psychiatric detail.  Medical history: Patient presented with a lithium level of 4.4 and an elevated creatinine of 2. She does have a apparent history of probably gastric reflux  possibly some form of herpes.  Social history: Patient has a legal guardian with the Department of Social Services. No known family involved. Has lived in the same family care home for many years and they are quite familiar with her.  Substance abuse history: None HPI Elements:   Quality:  Confusion and ataxia delirium. Severity:  Severe life-threatening. Timing:  Gradually progressive over at least a week. Duration:  Ongoing. Context:  Some increased lithium level or dose.  Past Medical History:  Past Medical History  Diagnosis Date  . Mental retardation     Mild  . Paranoid schizophrenia   . Seizures   . Developmental delay    History reviewed. No pertinent past surgical history. Family History: No family history on file. Social History:  History  Alcohol Use No     History  Drug Use No    Social History   Social History  . Marital Status: Single    Spouse Name: N/A  . Number of Children: 0  . Years of Education: n/a   Occupational History  . Disabled    Social History Main Topics  . Smoking status: Never Smoker   . Smokeless tobacco: Never Used  . Alcohol Use: No  . Drug Use: No  . Sexual Activity: Not Asked   Other Topics Concern  . None   Social History Narrative   Pt currently resides at [facility].   Caffeine Use: None   Additional Social History:  Allergies:   Allergies  Allergen Reactions  . Ritalin [Methylphenidate Hcl] Other (See Comments)    Reaction:  Unknown     Labs:  Results for orders placed or performed during the hospital encounter of 03/29/15 (from the past 48 hour(s))  Comprehensive metabolic panel     Status: Abnormal   Collection Time: 03/29/15  4:04 PM  Result Value Ref Range   Sodium 148 (H) 135 - 145 mmol/L   Potassium 3.5 3.5 - 5.1 mmol/L   Chloride 116 (H) 101 - 111 mmol/L   CO2 28 22 - 32 mmol/L   Glucose, Bld 101 (H) 65 - 99 mg/dL   BUN 42 (H) 6 - 20 mg/dL   Creatinine,  Ser 2.45 (H) 0.44 - 1.00 mg/dL   Calcium 11.3 (H) 8.9 - 10.3 mg/dL   Total Protein 7.0 6.5 - 8.1 g/dL   Albumin 3.3 (L) 3.5 - 5.0 g/dL   AST 37 15 - 41 U/L   ALT 24 14 - 54 U/L   Alkaline Phosphatase 115 38 - 126 U/L   Total Bilirubin 0.4 0.3 - 1.2 mg/dL   GFR calc non Af Amer 26 (L) >60 mL/min   GFR calc Af Amer 31 (L) >60 mL/min    Comment: (NOTE) The eGFR has been calculated using the CKD EPI equation. This calculation has not been validated in all clinical situations. eGFR's persistently <60 mL/min signify possible Chronic Kidney Disease.    Anion gap 4 (L) 5 - 15  CBC     Status: Abnormal   Collection Time: 03/29/15  4:04 PM  Result Value Ref Range   WBC 35.2 (H) 3.6 - 11.0 K/uL   RBC 3.56 (L) 3.80 - 5.20 MIL/uL   Hemoglobin 11.4 (L) 12.0 - 16.0 g/dL   HCT 34.4 (L) 35.0 - 47.0 %   MCV 96.7 80.0 - 100.0 fL   MCH 32.0 26.0 - 34.0 pg   MCHC 33.1 32.0 - 36.0 g/dL   RDW 14.1 11.5 - 14.5 %   Platelets 129 (L) 150 - 440 K/uL  Urinalysis complete, with microscopic ([facility] only)     Status: Abnormal   Collection Time: 03/29/15  4:04 PM  Result Value Ref Range   Color, Urine YELLOW (A) YELLOW   APPearance CLEAR (A) CLEAR   Glucose, UA NEGATIVE NEGATIVE mg/dL   Bilirubin Urine NEGATIVE NEGATIVE   Ketones, ur NEGATIVE NEGATIVE mg/dL   Specific Gravity, Urine 1.015 1.005 - 1.030   Hgb urine dipstick NEGATIVE NEGATIVE   pH 5.0 5.0 - 8.0   Protein, ur NEGATIVE NEGATIVE mg/dL   Nitrite NEGATIVE NEGATIVE   Leukocytes, UA NEGATIVE NEGATIVE   RBC / HPF 0-5 0 - 5 RBC/hpf   WBC, UA 0-5 0 - 5 WBC/hpf   Bacteria, UA RARE (A) NONE SEEN   Squamous Epithelial / LPF NONE SEEN NONE SEEN   Mucous PRESENT    Hyaline Casts, UA PRESENT   Lithium level     Status: Abnormal   Collection Time: 03/29/15  4:04 PM  Result Value Ref Range   Lithium Lvl 4.40 (HH) 0.60 - 1.20 mmol/L    Comment: CRITICAL RESULT CALLED TO, READ BACK BY AND VERIFIED WITH CALLED TO TERRY BROGAN @ 1710 ON 03/29/2015 [facility]     Valproic acid level     Status: Abnormal   Collection Time: 03/29/15  4:04 PM  Result Value Ref Range   Valproic Acid Lvl 110 (H) 50.0 - 100.0 ug/mL  Lipase, blood  Status: Abnormal   Collection Time: 03/29/15  4:04 PM  Result Value Ref Range   Lipase 300 (H) 22 - 51 U/L  Glucose, capillary     Status: None   Collection Time: 03/29/15  8:18 PM  Result Value Ref Range   Glucose-Capillary 76 65 - 99 mg/dL  Lithium level     Status: Abnormal   Collection Time: 03/30/15  1:47 AM  Result Value Ref Range   Lithium Lvl 1.80 (HH) 0.60 - 1.20 mmol/L    Comment: CRITICAL RESULT CALLED TO, READ BACK BY AND VERIFIED WITH BEN CASSIDY 03/30/2015 0320 [facility]  Basic metabolic panel     Status: Abnormal   Collection Time: 03/30/15  5:36 AM  Result Value Ref Range   Sodium 141 135 - 145 mmol/L   Potassium 2.9 (LL) 3.5 - 5.1 mmol/L    Comment: CRITICAL RESULT CALLED TO, READ BACK BY AND VERIFIED WITH BEN CASSIDY 03/30/2015 0609 [facility]   Chloride 106 101 - 111 mmol/L   CO2 30 22 - 32 mmol/L   Glucose, Bld 134 (H) 65 - 99 mg/dL   BUN 29 (H) 6 - 20 mg/dL   Creatinine, Ser 1.72 (H) 0.44 - 1.00 mg/dL   Calcium 9.0 8.9 - 10.3 mg/dL   GFR calc non Af Amer 41 (L) >60 mL/min   GFR calc Af Amer 47 (L) >60 mL/min    Comment: (NOTE) The eGFR has been calculated using the CKD EPI equation. This calculation has not been validated in all clinical situations. eGFR's persistently <60 mL/min signify possible Chronic Kidney Disease.    Anion gap 5 5 - 15  CBC     Status: Abnormal   Collection Time: 03/30/15  5:36 AM  Result Value Ref Range   WBC 29.7 (H) 3.6 - 11.0 K/uL   RBC 3.15 (L) 3.80 - 5.20 MIL/uL   Hemoglobin 9.8 (L) 12.0 - 16.0 g/dL   HCT 29.9 (L) 35.0 - 47.0 %   MCV 95.0 80.0 - 100.0 fL   MCH 31.1 26.0 - 34.0 pg   MCHC 32.7 32.0 - 36.0 g/dL   RDW 13.8 11.5 - 14.5 %   Platelets 86 (L) 150 - 440 K/uL  Lactic acid, plasma     Status: None   Collection Time: 03/30/15  5:36 AM  Result Value Ref  Range   Lactic Acid, Venous 0.9 0.5 - 2.0 mmol/L  Magnesium     Status: None   Collection Time: 03/30/15  5:36 AM  Result Value Ref Range   Magnesium 2.3 1.7 - 2.4 mg/dL  Phosphorus     Status: None   Collection Time: 03/30/15  5:36 AM  Result Value Ref Range   Phosphorus 3.6 2.5 - 4.6 mg/dL  Lithium level     Status: Abnormal   Collection Time: 03/30/15  9:03 AM  Result Value Ref Range   Lithium Lvl 2.90 (HH) 0.60 - 1.20 mmol/L    Comment: CRITICAL RESULT CALLED TO, READ BACK BY AND VERIFIED WITH LYDIA YOUNG AT 0949 ON 03/30/15 BY MLZ   Potassium     Status: None   Collection Time: 03/30/15  4:15 PM  Result Value Ref Range   Potassium 4.1 3.5 - 5.1 mmol/L    Vitals: Blood pressure 102/72, pulse 108, temperature 98.4 F (36.9 C), temperature source Axillary, resp. rate 29, height 5' (1.524 m), weight 77.111 kg (170 lb), SpO2 96 %.  Risk to Self: Is patient at risk for suicide?: No Risk to  Others:   Prior Inpatient Therapy:   Prior Outpatient Therapy:    Current Facility-Administered Medications  Medication Dose Route Frequency Provider Last Rate Last Dose  . 0.45 % sodium chloride infusion   Intravenous Continuous Munsoor Lateef, MD 125 mL/hr at 03/30/15 1238    . 0.9 %  sodium chloride infusion  100 mL Intravenous PRN Munsoor Lateef, MD      . 0.9 %  sodium chloride infusion  100 mL Intravenous PRN Munsoor Lateef, MD      . acetaminophen (TYLENOL) tablet 650 mg  650 mg Oral Q6H PRN Henreitta Leber, MD       Or  . acetaminophen (TYLENOL) suppository 650 mg  650 mg Rectal Q6H PRN Henreitta Leber, MD      . acetaminophen (TYLENOL) tablet 325 mg  325 mg Oral Q6H PRN Henreitta Leber, MD      . alteplase (CATHFLO ACTIVASE) injection 2 mg  2 mg Intracatheter Once PRN Munsoor Lateef, MD      . ARIPiprazole (ABILIFY) tablet 15 mg  15 mg Oral Q1200 Henreitta Leber, MD   15 mg at 03/30/15 1201  . asenapine (SAPHRIS) sublingual tablet 10 mg  10 mg Sublingual TID Henreitta Leber, MD   10  mg at 03/30/15 1646  . benztropine (COGENTIN) tablet 0.5 mg  0.5 mg Oral QHS Henreitta Leber, MD   0.5 mg at 03/29/15 2228  . cholecalciferol (VITAMIN D) tablet 1,000 Units  1,000 Units Oral BID Henreitta Leber, MD   1,000 Units at 03/30/15 [phone]  . darifenacin (ENABLEX) 24 hr tablet 7.5 mg  7.5 mg Oral Daily Henreitta Leber, MD   7.5 mg at 03/30/15 0943  . divalproex (DEPAKOTE SPRINKLE) capsule 750 mg  750 mg Oral BID Henreitta Leber, MD   750 mg at 03/30/15 0944  . docusate sodium (COLACE) capsule 200 mg  200 mg Oral Daily Henreitta Leber, MD   200 mg at 03/30/15 0942  . feeding supplement (NEPRO CARB STEADY) liquid 237 mL  237 mL Oral PRN Munsoor Lateef, MD      . ferrous sulfate tablet 325 mg  325 mg Oral Daily Henreitta Leber, MD   325 mg at 03/30/15 0943  . heparin injection 1,000 Units  1,000 Units Dialysis PRN Munsoor Lateef, MD      . heparin injection 5,000 Units  5,000 Units Subcutaneous 3 times per day Henreitta Leber, MD   5,000 Units at 03/30/15 1312  . [START ON 03/31/2015] Influenza vac split quadrivalent PF (FLUARIX) injection 0.5 mL  0.5 mL Intramuscular Tomorrow-1000 Fritzi Mandes, MD      . levofloxacin (LEVAQUIN) IVPB 500 mg  500 mg Intravenous q1800 Dustin Flock, MD   500 mg at 03/30/15 1646  . lidocaine (PF) (XYLOCAINE) 1 % injection 5 mL  5 mL Intradermal PRN Munsoor Lateef, MD      . lidocaine-prilocaine (EMLA) cream 1 application  1 application Topical PRN Munsoor Lateef, MD      . LORazepam (ATIVAN) injection 2 mg  2 mg Intravenous Q4H PRN Lytle Butte, MD   2 mg at 03/30/15 1451  . meclizine (ANTIVERT) tablet 25 mg  25 mg Oral TID PRN Henreitta Leber, MD      . nystatin cream (MYCOSTATIN) 1 application  1 application Topical BID Henreitta Leber, MD   1 application at 05/26/24 [phone]  . olopatadine (PATANOL) 0.1 % ophthalmic solution 1 drop  1  drop Both Eyes BID Henreitta Leber, MD   1 drop at 03/29/15 2355  . ondansetron (ZOFRAN) tablet 4 mg  4 mg Oral Q6H PRN Henreitta Leber, MD       Or  . ondansetron (ZOFRAN) injection 4 mg  4 mg Intravenous Q6H PRN Henreitta Leber, MD      . PARoxetine (PAXIL) tablet 20 mg  20 mg Oral Daily Henreitta Leber, MD   20 mg at 03/30/15 0943  . pentafluoroprop-tetrafluoroeth (GEBAUERS) aerosol 1 application  1 application Topical PRN Munsoor Lateef, MD      . polyethylene glycol (MIRALAX / GLYCOLAX) packet 17 g  17 g Oral QODAY Henreitta Leber, MD   17 g at 03/30/15 0944  . QUEtiapine (SEROQUEL) tablet 50 mg  50 mg Oral TID Henreitta Leber, MD   50 mg at 03/30/15 1646  . valACYclovir (VALTREX) tablet 2,000 mg  2,000 mg Oral BID PRN Henreitta Leber, MD        Musculoskeletal: Strength & Muscle Tone: decreased Gait & Station: unable to stand Patient leans: N/A  Psychiatric Specialty Exam: Physical Exam  Nursing note and vitals reviewed. Constitutional: She appears well-developed and well-nourished. She appears lethargic. She is uncooperative. She appears toxic.  HENT:  Head: Normocephalic and atraumatic.    Eyes: Conjunctivae are normal. Pupils are equal, round, and reactive to light.  Neck: Normal range of motion.  Cardiovascular: Normal heart sounds.   Respiratory: Effort normal.  GI: Soft.  Musculoskeletal: Normal range of motion.  Neurological: She appears lethargic.  Skin: Skin is warm and dry.  Psychiatric: Her affect is labile. She is agitated. Cognition and memory are impaired. She expresses impulsivity. She is noncommunicative.  Patient does not respond verbally. She seems to pay some attention and make some attempt to look at me when I speak with her. Can't engage in anything like conversation.    Review of Systems  Unable to perform ROS   Blood pressure 102/72, pulse 108, temperature 98.4 F (36.9 C), temperature source Axillary, resp. rate 29, height 5' (1.524 m), weight 77.111 kg (170 lb), SpO2 96 %.Body mass index is 33.2 kg/(m^2).  General Appearance: Fairly Groomed  Engineer, water::  Minimal   Speech:  Garbled  Volume:  Decreased  Mood:  Not stated  Affect:  Flat  Thought Process:  Not able to really identify  Orientation:  Negative  Thought Content:  Negative  Suicidal Thoughts:  No  Homicidal Thoughts:  No  Memory:  Negative  Judgement:  Impaired  Insight:  Lacking  Psychomotor Activity:  Decreased  Concentration:  Poor  Recall:  Poor  Fund of Knowledge:Poor  Language: Poor  Akathisia:  No  Handed:  Right  AIMS (if indicated):     Assets:  Housing Social Support  ADL's:  Impaired  Cognition: Impaired,  Severe  Sleep:      Medical Decision Making: New problem, with additional work up planned, Review or order clinical lab tests (1), Review or order medicine tests (1) and Review of Medication Regimen & Side Effects (2)  Treatment Plan Summary: Plan Asian obviously presented with lithium toxicity with a lithium level of 4.4. That level of lithium toxicity would account for all of her acute symptoms including this degree of delirium. One question is why her lithium level was so high. One possibility would be accidentally having been given more lithium than prescribed although we don't have any evidence of that. One other possibility would  be that her kidney function was worsening for some other reason which then increase the lithium level in something of a vicious cycle. May have just gotten very dehydrated. As I suspected dialysis decreased her measured lithium level acutely and then there was a rebound rise in it as lithium was redistributed from the tissues. I would continue to suggest lithium levels probably being done at least twice a day if not more. Probably before and after dialysis and then several hours later. Patient may need medication to help to calm her down. Other psychiatric medicine that she's taking can be safely continued. Would recommend that once her lithium levels consistently down to the range of 1 and is not rebounding that we reassess her mental state.  Hopefully she will be able to regain her normal mental state although it's possible that her may be some lasting damage.  Plan:  Continue monitoring. Disposition: No need for psychiatric hospitalization  Alethia Berthold 03/30/2015 5:58 PM

## 2015-03-30 NOTE — Progress Notes (Signed)
Initial Nutrition Assessment    INTERVENTION:  Coordination of care: await diet progression  NUTRITION DIAGNOSIS:   Inadequate oral intake related to acute illness as evidenced by NPO status.    GOAL:   Patient will meet greater than or equal to 90% of their needs    MONITOR:    (Energy intake, Digestive system)  REASON FOR ASSESSMENT:   Other (Comment) (HD)    ASSESSMENT:      Pt admitted with emesis, nausea, lithium toxicity, ARF, temporary dialysis being done  Past Medical History  Diagnosis Date  . Mental retardation     Mild  . Paranoid schizophrenia   . Seizures   . Developmental delay     Current Nutrition: NPO  Food/Nutrition-Related History: unable to obtain from pt   Medications: 1/2 NS at 146ml/hr, vit D, colace, fe sulfate, miralax, KCL  Electrolyte/Renal Profile and Glucose Profile:   Recent Labs Lab 03/29/15 1604 03/30/15 0536  NA 148* 141  K 3.5 2.9*  CL 116* 106  CO2 28 30  BUN 42* 29*  CREATININE 2.45* 1.72*  CALCIUM 11.3* 9.0  MG  --  2.3  PHOS  --  3.6  GLUCOSE 101* 134*   Protein Profile:   Recent Labs Lab 03/29/15 1604  ALBUMIN 3.3*    Gastrointestinal Profile: Last BM:8/28   Nutrition-Focused Physical Exam Findings:  Unable to complete Nutrition-Focused physical exam at this time.     Weight Change: unable to determine if any wt loss prior to admission    Diet Order:  Diet NPO time specified Except for: Sips with Meds  Skin:   reviewed   Height:   Ht Readings from Last 1 Encounters:  03/29/15 5' (1.524 m)    Weight:   Wt Readings from Last 1 Encounters:  03/29/15 170 lb (77.111 kg)     BMI:  Body mass index is 33.2 kg/(m^2).      EDUCATION NEEDS:   No education needs identified at this time  LOW Care Level  Geovanna Simko B. Freida Busman, RD, LDN [phone] (pager)

## 2015-03-30 NOTE — Progress Notes (Signed)
[facility]  Daily Progress Note   Subjective  - * No surgery found *  Resting quietly Catheter worked well for dialysis. No new events  Objective Filed Vitals:   03/30/15 0100 03/30/15 0115 03/30/15 0130 03/30/15 0145  BP: 80/65  Pulse: 79 93 83 89  Temp:      TempSrc:      Resp: Height:      Weight:      SpO2: 100% 99% 100%     Intake/Output Summary (Last 24 hours) at 03/30/15 0151 Last data filed at 03/30/15 0000  Gross per 24 hour  Intake    375 ml  Output      0 ml  Net    375 ml    PULM  CTAB CV  RRR VASC  Catheter in place.  C/D/I  Laboratory CBC    Component Value Date/Time   WBC 35.2* 03/29/2015 1604   WBC 10.7 03/23/2012 1502   HGB 11.4* 03/29/2015 1604   HGB 12.9 03/23/2012 1502   HCT 34.4* 03/29/2015 1604   HCT 38.7 03/23/2012 1502   PLT 129* 03/29/2015 1604   PLT 213 03/23/2012 1502    BMET    Component Value Date/Time   NA 148* 03/29/2015 1604   NA 138 03/23/2012 1502   K 3.5 03/29/2015 1604   K 4.3 03/23/2012 1502   CL 116* 03/29/2015 1604   CL 107 03/23/2012 1502   CO2 28 03/29/2015 1604   CO2 25 03/23/2012 1502   GLUCOSE 101* 03/29/2015 1604   GLUCOSE 132* 03/23/2012 1502   BUN 42* 03/29/2015 1604   BUN 13 03/23/2012 1502   CREATININE 2.45* 03/29/2015 1604   CREATININE 1.16 03/23/2012 1502   CALCIUM 11.3* 03/29/2015 1604   CALCIUM 9.1 03/23/2012 1502   GFRNONAA 26* 03/29/2015 1604   GFRNONAA >60 03/23/2012 1502   GFRAA 31* 03/29/2015 1604   GFRAA >60 03/23/2012 1502    Assessment/Planning: POD #1 s/p dialysis catheter placement for lithium toxicity and ARF   Catheter working well  No bleeding from site  No new recs at this point.  Please call with questions    DEW,JASON  03/30/2015, 1:51 AM

## 2015-03-30 NOTE — Progress Notes (Signed)
PHARMACY - CRITICAL CARE PROGRESS NOTE  Pharmacy Consult for Electrolyte Supplementation    Allergies  Allergen Reactions  . Ritalin [Methylphenidate Hcl] Other (See Comments)    Reaction:  Unknown     Patient Measurements: Height: 5' (152.4 cm) Weight: 170 lb (77.111 kg) IBW/kg (Calculated) : 45.5   Vital Signs: Temp: 98.4 F (36.9 C) (08/30 0400) Temp Source: Axillary (08/30 0400) BP: 99/63 mmHg (08/30 0700) Pulse Rate: 80 (08/30 0700) Intake/Output from previous day: 08/29 0701 - 08/30 0700 In: 1250 [I.V.:1250] Out: 250 [Urine:500] Intake/Output from this shift: Total I/O In: 225 [I.V.:125; IV Piggyback:100] Out: -      Labs:  Recent Labs  03/29/15 1604 03/30/15 0536  WBC 35.2* 29.7*  HGB 11.4* 9.8*  HCT 34.4* 29.9*  PLT 129* 86*  CREATININE 2.45* 1.72*  MG  --  2.3  PHOS  --  3.6  ALBUMIN 3.3*  --   PROT 7.0  --   AST 37  --   ALT 24  --   ALKPHOS 115  --   BILITOT 0.4  --    Estimated Creatinine Clearance: 46.3 mL/min (by C-G formula based on Cr of 1.72).   Recent Labs  03/29/15 2018  GLUCAP 76    Medications:  Scheduled:  . ARIPiprazole  15 mg Oral Q1200  . asenapine  10 mg Sublingual TID  . benztropine  0.5 mg Oral QHS  . cholecalciferol  1,000 Units Oral BID  . cloNIDine  0.3 mg Transdermal Weekly  . darifenacin  7.5 mg Oral Daily  . divalproex  750 mg Oral BID  . docusate sodium  200 mg Oral Daily  . ferrous sulfate  325 mg Oral Daily  . heparin  5,000 Units Subcutaneous 3 times per day  . Influenza vac split quadrivalent PF  0.5 mL Intramuscular Tomorrow-1000  . nystatin cream  1 application Topical BID  . olopatadine  1 drop Both Eyes BID  . PARoxetine  20 mg Oral Daily  . polyethylene glycol  17 g Oral QODAY  . potassium chloride  10 mEq Intravenous Q1 Hr x 6  . QUEtiapine  50 mg Oral TID   Infusions:  . sodium chloride 125 mL/hr at 03/30/15 0620    Assessment: Pharmacy consulted to correct electrolyte abnormalities  in a 24 yo female admitted for lithium toxicity.  Patient underwent hemodialysis earlier this AM per poison control recommendations.  SCr improving but will need to follow closely.  Potassium: 2.9, Mag: 2.3, Phos: 3.6, Ca: 9.0, albumin: 3.3 CCa: 9.56  Goal of Therapy:  Electrolytes within normal limits  Plan:  Will supplement with potassium chloride 10 meq IV x 6.  Will order follow up potassium level for 1500 today to assess further requirements.  All other levels within normal limits.  Will recheck labs in AM.   Pharmacy will continue to follow.  Arlis Yale G 03/30/2015,9:06 AM

## 2015-03-30 NOTE — Progress Notes (Signed)
Dr Cherylann Ratel notified of pt's lithium level result of 2.90, MD orders for pt to be dialysed again.

## 2015-03-30 NOTE — Progress Notes (Signed)
Pt started on dialysis at 1300, vs wnl, ativan given for anxiety and agitation.

## 2015-03-30 NOTE — Progress Notes (Addendum)
[facility]  ROUNDING NOTE   Subjective:  Pt seen at bedside. Had HD last night. K noted to be low this AM. Pt very agitated this AM. Has soft mitts on her hands. Lithium level significantly down to 1.8.   Objective:  Vital signs in last 24 hours:  Temp:  [97.5 F (36.4 C)-98.4 F (36.9 C)] 98.4 F (36.9 C) (08/30 0400) Pulse Rate:  [71-93] 80 (08/30 0700) Resp:  [12-32] 23 (08/30 0700) BP: (71-123)/(40-78) 99/63 mmHg (08/30 0700) SpO2:  [85 %-100 %] 99 % (08/30 0700) Weight:  [77.111 kg (170 lb)] 77.111 kg (170 lb) (08/29 1454)  Weight change:  Filed Weights   03/29/15 1454  Weight: 77.111 kg (170 lb)    Intake/Output: I/O last 3 completed shifts: In: 1250 [I.V.:1250] Out: 250 [Urine:500]   Intake/Output this shift:  Total I/O In: 225 [I.V.:125; IV Piggyback:100] Out: -   Physical Exam: General: agitated  Head: Normocephalic, atraumatic. Moist oral mucosal membranes  Eyes: Anicteric  Neck: Supple, trachea midline  Lungs:  Clear to auscultation normal effort  Heart: S1S2 tachycardic  Abdomen:  Soft, nontender, BS present  Extremities: no peripheral edema.  Neurologic: Awake but very agitated  Skin: No lesions  Access: Right femoral temp HD catheter    Basic Metabolic Panel:  Recent Labs Lab 03/29/15 1604 03/30/15 0536  NA 148* 141  K 3.5 2.9*  CL 116* 106  CO2 28 30  GLUCOSE 101* 134*  BUN 42* 29*  CREATININE 2.45* 1.72*  CALCIUM 11.3* 9.0  MG  --  2.3  PHOS  --  3.6    Liver Function Tests:  Recent Labs Lab 03/29/15 1604  AST 37  ALT 24  ALKPHOS 115  BILITOT 0.4  PROT 7.0  ALBUMIN 3.3*    Recent Labs Lab 03/29/15 1604  LIPASE 300*   No results for input(s): AMMONIA in the last 168 hours.  CBC:  Recent Labs Lab 03/29/15 1604 03/30/15 0536  WBC 35.2* 29.7*  HGB 11.4* 9.8*  HCT 34.4* 29.9*  MCV 96.7 95.0  PLT 129* 86*    Cardiac Enzymes: No results for input(s): CKTOTAL, CKMB, CKMBINDEX, TROPONINI in  the last 168 hours.  BNP: Invalid input(s): POCBNP  CBG:  Recent Labs Lab 03/29/15 2018  GLUCAP 76    Microbiology: No results found for this or any previous visit.  Coagulation Studies: No results for input(s): LABPROT, INR in the last 72 hours.  Urinalysis:  Recent Labs  03/29/15 1604  COLORURINE YELLOW*  LABSPEC 1.015  PHURINE 5.0  GLUCOSEU NEGATIVE  HGBUR NEGATIVE  BILIRUBINUR NEGATIVE  KETONESUR NEGATIVE  PROTEINUR NEGATIVE  NITRITE NEGATIVE  LEUKOCYTESUR NEGATIVE      Imaging: Dg Chest 2 View  03/29/2015   CLINICAL DATA:  Congestion after vomiting.  EXAM: CHEST  2 VIEW  COMPARISON:  None.  FINDINGS: Marked hypoventilation, limiting sensitivity. No gross asymmetry in aeration. No effusion or air leak. Normal heart size and mediastinal contours.  Stool dilated colon visualized in the upper abdomen.  IMPRESSION: 1. Very limited low volume chest, cannot exclude aspiration pneumonitis. 2. Marked stool retention in the upper abdomen.   Electronically Signed   By: Marnee Spring M.D.   On: 03/29/2015 15:33     Medications:   . sodium chloride 125 mL/hr at 03/30/15 0620   . ARIPiprazole  15 mg Oral Q1200  . asenapine  10 mg Sublingual TID  . benztropine  0.5 mg Oral QHS  . cholecalciferol  1,000  Units Oral BID  . cloNIDine  0.3 mg Transdermal Weekly  . darifenacin  7.5 mg Oral Daily  . divalproex  750 mg Oral BID  . docusate sodium  200 mg Oral Daily  . ferrous sulfate  325 mg Oral Daily  . heparin  5,000 Units Subcutaneous 3 times per day  . Influenza vac split quadrivalent PF  0.5 mL Intramuscular Tomorrow-1000  . nystatin cream  1 application Topical BID  . olopatadine  1 drop Both Eyes BID  . PARoxetine  20 mg Oral Daily  . polyethylene glycol  17 g Oral QODAY  . potassium chloride  10 mEq Intravenous Q1 Hr x 6  . QUEtiapine  50 mg Oral TID   sodium chloride, sodium chloride, acetaminophen **OR** acetaminophen, acetaminophen, alteplase, feeding  supplement (NEPRO CARB STEADY), heparin, lidocaine (PF), lidocaine-prilocaine, LORazepam, meclizine, ondansetron **OR** ondansetron (ZOFRAN) IV, pentafluoroprop-tetrafluoroeth, valACYclovir  Assessment/ Plan:  24 y.o. female with a PMHx of mental retardation, paranoid schizophrenia, seizures, developmental delay, who was admitted to [facility] on 03/29/2015 for evaluation of ataxia, nausea, vomiting, and altered mental status.   1. Lithium toxicity R78.89 2. Acute renal failure. 3. Hypernatremia. 4. Hypotension. 5.  Hypokalemia.  Plan:  Lithium level is down this AM to 1.8.  However she is quite agitated at present and has soft mitts on.  She has been continued on IVF hydration.  K noted to be low this AM.  Agree with IV KCL repletion.  Will continue 0.9 NS at present.  Follow up lithium level this AM.  Will hold off on further HD at present as Cr has also improved.  Will follow progress closely with you.    LOS: 1 Terren Haberle 8/30/20169:41 AM   Update 1008 AM:  Nursing just called with repeat lithium level which is now 2.9.  Rebound effect is expected at times.  Therefore will proceed with another dialysis session at present, will plan for 2.5 hours, BFR 250, DFR 500, no UF, 4K bath given low K.  Follow lithium level.

## 2015-03-30 NOTE — Progress Notes (Signed)
PHARMACY - CRITICAL CARE PROGRESS NOTE  Pharmacy Consult for Electrolyte Supplementation    Allergies  Allergen Reactions  . Ritalin [Methylphenidate Hcl] Other (See Comments)    Reaction:  Unknown     Patient Measurements: Height: 5' (152.4 cm) Weight: 170 lb (77.111 kg) IBW/kg (Calculated) : 45.5   Vital Signs: BP: 94/70 mmHg (08/30 1615) Pulse Rate: 94 (08/30 1620) Intake/Output from previous day: 08/29 0701 - 08/30 0700 In: 1250 [I.V.:1250] Out: 250 [Urine:500] Intake/Output from this shift: Total I/O In: 1475 [I.V.:875; IV Piggyback:600] Out: -200      Labs:  Recent Labs  03/29/15 1604 03/30/15 0536  WBC 35.2* 29.7*  HGB 11.4* 9.8*  HCT 34.4* 29.9*  PLT 129* 86*  CREATININE 2.45* 1.72*  MG  --  2.3  PHOS  --  3.6  ALBUMIN 3.3*  --   PROT 7.0  --   AST 37  --   ALT 24  --   ALKPHOS 115  --   BILITOT 0.4  --    Estimated Creatinine Clearance: 46.3 mL/min (by C-G formula based on Cr of 1.72).   Recent Labs  03/29/15 2018  GLUCAP 76   BMP Latest Ref Rng 03/30/2015 03/30/2015 03/29/2015  Glucose 65 - 99 mg/dL - 161(W) 960(A)  BUN 6 - 20 mg/dL - 54(U) 98(J)  Creatinine 0.44 - 1.00 mg/dL - 1.91(Y) 7.82(N)  Sodium 135 - 145 mmol/L - 141 148(H)  Potassium 3.5 - 5.1 mmol/L 4.1 2.9(LL) 3.5  Chloride 101 - 111 mmol/L - 106 116(H)  CO2 22 - 32 mmol/L - 30 28  Calcium 8.9 - 10.3 mg/dL - 9.0 11.3(H)    Medications:  Scheduled:  . ARIPiprazole  15 mg Oral Q1200  . asenapine  10 mg Sublingual TID  . benztropine  0.5 mg Oral QHS  . cholecalciferol  1,000 Units Oral BID  . darifenacin  7.5 mg Oral Daily  . divalproex  750 mg Oral BID  . docusate sodium  200 mg Oral Daily  . ferrous sulfate  325 mg Oral Daily  . heparin  5,000 Units Subcutaneous 3 times per day  . [START ON 03/31/2015] Influenza vac split quadrivalent PF  0.5 mL Intramuscular Tomorrow-1000  . levofloxacin (LEVAQUIN) IV  500 mg Intravenous q1800  . nystatin cream  1 application Topical  BID  . olopatadine  1 drop Both Eyes BID  . PARoxetine  20 mg Oral Daily  . polyethylene glycol  17 g Oral QODAY  . QUEtiapine  50 mg Oral TID   Infusions:  . sodium chloride 125 mL/hr at 03/30/15 1238    Assessment: Pharmacy consulted to correct electrolyte abnormalities in a 24 yo female admitted for lithium toxicity.  Patient underwent hemodialysis earlier this AM per poison control recommendations.  SCr improving but will need to follow closely.   Goal of Therapy:  Electrolytes within normal limits  Plan:  Potassium level is wnl. Will recheck labs in AM.   Pharmacy will continue to follow.  Luisa Hart D 03/30/2015,5:11 PM

## 2015-03-31 LAB — RENAL FUNCTION PANEL
ANION GAP: 3 — AB (ref 5–15)
Albumin: 2.6 g/dL — ABNORMAL LOW (ref 3.5–5.0)
BUN: 19 mg/dL (ref 6–20)
CHLORIDE: 105 mmol/L (ref 101–111)
CO2: 29 mmol/L (ref 22–32)
Calcium: 9.8 mg/dL (ref 8.9–10.3)
Creatinine, Ser: 1.18 mg/dL — ABNORMAL HIGH (ref 0.44–1.00)
Glucose, Bld: 116 mg/dL — ABNORMAL HIGH (ref 65–99)
Phosphorus: 2.5 mg/dL (ref 2.5–4.6)
Potassium: 4 mmol/L (ref 3.5–5.1)
Sodium: 137 mmol/L (ref 135–145)

## 2015-03-31 LAB — CBC
HCT: 31.6 % — ABNORMAL LOW (ref 35.0–47.0)
HEMATOCRIT: 30.6 % — AB (ref 35.0–47.0)
HEMOGLOBIN: 10.4 g/dL — AB (ref 12.0–16.0)
Hemoglobin: 10.3 g/dL — ABNORMAL LOW (ref 12.0–16.0)
MCH: 31.9 pg (ref 26.0–34.0)
MCH: 30.9 pg (ref 26.0–34.0)
MCHC: 33.8 g/dL (ref 32.0–36.0)
MCHC: 32.9 g/dL (ref 32.0–36.0)
MCV: 94.3 fL (ref 80.0–100.0)
MCV: 94.1 fL (ref 80.0–100.0)
PLATELETS: 84 10*3/uL — AB (ref 150–440)
PLATELETS: 87 10*3/uL — AB (ref 150–440)
RBC: 3.24 MIL/uL — ABNORMAL LOW (ref 3.80–5.20)
RBC: 3.36 MIL/uL — AB (ref 3.80–5.20)
RDW: 13.5 % (ref 11.5–14.5)
RDW: 13.3 % (ref 11.5–14.5)
WBC: 22.9 10*3/uL — ABNORMAL HIGH (ref 3.6–11.0)
WBC: 16.4 10*3/uL — AB (ref 3.6–11.0)

## 2015-03-31 LAB — COMPREHENSIVE METABOLIC PANEL
ALT: 21 U/L (ref 14–54)
ANION GAP: 4 — AB (ref 5–15)
AST: 32 U/L (ref 15–41)
Albumin: 2.5 g/dL — ABNORMAL LOW (ref 3.5–5.0)
Alkaline Phosphatase: 108 U/L (ref 38–126)
BILIRUBIN TOTAL: 0.6 mg/dL (ref 0.3–1.2)
BUN: 23 mg/dL — AB (ref 6–20)
CALCIUM: 9.9 mg/dL (ref 8.9–10.3)
CHLORIDE: 105 mmol/L (ref 101–111)
CO2: 28 mmol/L (ref 22–32)
CREATININE: 1.51 mg/dL — AB (ref 0.44–1.00)
GFR calc Af Amer: 55 mL/min — ABNORMAL LOW (ref >60)
GFR, EST NON AFRICAN AMERICAN: 47 mL/min — AB (ref >60)
GLUCOSE: 121 mg/dL — AB (ref 65–99)
Potassium: 3.5 mmol/L (ref 3.5–5.1)
Sodium: 137 mmol/L (ref 135–145)
Total Protein: 5.5 g/dL — ABNORMAL LOW (ref 6.5–8.1)

## 2015-03-31 LAB — HEPATITIS C ANTIBODY: HCV Ab: <0.1 s/co ratio (ref 0.0–0.9)

## 2015-03-31 LAB — MRSA PCR SCREENING: MRSA by PCR: NEGATIVE

## 2015-03-31 LAB — LITHIUM LEVEL
Lithium Lvl: 1.8 mmol/L (ref 0.60–1.20)
Lithium Lvl: 1.9 mmol/L (ref 0.60–1.20)

## 2015-03-31 LAB — PHOSPHORUS: Phosphorus: 3.2 mg/dL (ref 2.5–4.6)

## 2015-03-31 LAB — HEPATITIS B SURFACE ANTIGEN: HEP B S AG: NEGATIVE

## 2015-03-31 LAB — HEPATITIS B SURFACE ANTIBODY, QUANTITATIVE

## 2015-03-31 LAB — HEPATITIS B CORE ANTIBODY, TOTAL: HEP B C TOTAL AB: NEGATIVE

## 2015-03-31 LAB — MAGNESIUM: Magnesium: 2.1 mg/dL (ref 1.7–2.4)

## 2015-03-31 MED ORDER — POTASSIUM CHLORIDE 10 MEQ/100ML IV SOLN
10.0000 meq | INTRAVENOUS | Status: AC
  Administered 2015-03-31 (×4): 10 meq via INTRAVENOUS
  Filled 2015-03-31 (×4): qty 100

## 2015-03-31 MED ORDER — VALPROATE SODIUM 500 MG/5ML IV SOLN
750.0000 mg | Freq: Two times a day (BID) | INTRAVENOUS | Status: DC
  Administered 2015-03-31 – 2015-04-03 (×7): 750 mg via INTRAVENOUS
  Filled 2015-03-31 (×9): qty 7.5

## 2015-03-31 NOTE — Progress Notes (Signed)
Speech Therapy Note: received order, consulted NSG and reviewed chart notes. Attempted to see pt for BSE. Pt was given mod-max verbal and tactile stim but only moved in the bed and did NOT fully awaken or alert. Pt is not safe for oral intake at this time. Rec. Remain NPO; ST will f/u tomorrow w/ attempt at BSE. Rec. Oral care for hygiene and stim. NSG updated.

## 2015-03-31 NOTE — Progress Notes (Signed)
PHARMACY - CRITICAL CARE PROGRESS NOTE  Pharmacy Consult for Electrolyte Supplementation    Allergies  Allergen Reactions  . Ritalin [Methylphenidate Hcl] Other (See Comments)    Reaction:  Unknown     Patient Measurements: Height: 5' (152.4 cm) Weight: 170 lb (77.111 kg) IBW/kg (Calculated) : 45.5   Vital Signs: BP: 98/49 mmHg (08/31 0600) Pulse Rate: 73 (08/31 0600) Intake/Output from previous day: 08/30 0701 - 08/31 0700 In: 3575 [I.V.:2875; IV Piggyback:700] Out: 601 [Urine:800] Intake/Output from this shift: Total I/O In: 1375 [I.V.:1375] Out: 450 [Urine:450]     Labs:  Recent Labs  03/29/15 1604 03/30/15 0536 03/31/15 0504  WBC 35.2* 29.7* 22.9*  HGB 11.4* 9.8* 10.3*  HCT 34.4* 29.9* 30.6*  PLT 129* 86* 84*  CREATININE 2.45* 1.72* 1.51*  MG  --  2.3 2.1  PHOS  --  3.6 3.2  ALBUMIN 3.3*  --  2.5*  PROT 7.0  --  5.5*  AST 37  --  32  ALT 24  --  21  ALKPHOS 115  --  108  BILITOT 0.4  --  0.6   Estimated Creatinine Clearance: 52.7 mL/min (by C-G formula based on Cr of 1.51).   Recent Labs  03/29/15 2018  GLUCAP 76   BMP Latest Ref Rng 03/31/2015 03/30/2015 03/30/2015  Glucose 65 - 99 mg/dL 161(W) - 960(A)  BUN 6 - 20 mg/dL 54(U) - 98(J)  Creatinine 0.44 - 1.00 mg/dL 1.91(Y) - 7.82(N)  Sodium 135 - 145 mmol/L 137 - 141  Potassium 3.5 - 5.1 mmol/L 3.5 4.1 2.9(LL)  Chloride 101 - 111 mmol/L 105 - 106  CO2 22 - 32 mmol/L 28 - 30  Calcium 8.9 - 10.3 mg/dL 9.9 - 9.0    Medications:  Scheduled:  . ARIPiprazole  15 mg Oral Q1200  . asenapine  10 mg Sublingual TID  . benztropine  0.5 mg Oral QHS  . cholecalciferol  1,000 Units Oral BID  . darifenacin  7.5 mg Oral Daily  . divalproex  750 mg Oral BID  . docusate sodium  200 mg Oral Daily  . ferrous sulfate  325 mg Oral Daily  . heparin  5,000 Units Subcutaneous 3 times per day  . Influenza vac split quadrivalent PF  0.5 mL Intramuscular Tomorrow-1000  . levofloxacin (LEVAQUIN) IV  500 mg  Intravenous q1800  . nystatin cream  1 application Topical BID  . olopatadine  1 drop Both Eyes BID  . PARoxetine  20 mg Oral Daily  . polyethylene glycol  17 g Oral QODAY  . potassium chloride  10 mEq Intravenous Q1 Hr x 4  . QUEtiapine  50 mg Oral TID   Infusions:  . sodium chloride 125 mL/hr (03/31/15 0501)    Assessment: Pharmacy consulted to correct electrolyte abnormalities in a 24 yo female admitted for lithium toxicity.  Patient underwent hemodialysis earlier this AM per poison control recommendations.  SCr improving but will need to follow closely.   Goal of Therapy:  Electrolytes within normal limits  Plan:  K 3.5 ordered 10 mEq IV x 4.  Mg 2.1, Phos 3.2. Will recheck labs in AM.   Pharmacy will continue to follow.  Carola Frost, Pharm.D. Clinical Pharmacist 03/31/2015,6:34 AM

## 2015-03-31 NOTE — Progress Notes (Signed)
[facility] [place] at [facility]                                                                                                                                                                                            Patient Demographics   Hannah Keller, is a 24 y.o. female, DOB - Oct 04, 1990, ZOX:[id number]  Admit date - 03/29/2015   Admitting Physician Houston Siren, MD  Outpatient Primary MD for the patient is Domenic Schwab, FNP   LOS - 2  Subjective: Patient continues to be intermittently agitated. Unable to take any by mouth will be receiving hemodialysis later today again due to lithium level rebounding.    Review of Systems:   Due to agitation and unable to provide any history  Vitals:   Filed Vitals:   03/31/15 1000 03/31/15 1100 03/31/15 1200 03/31/15 1300  BP: 117/75  Pulse: 91 83 90 81  Temp:      TempSrc:      Resp: 33 28 38 25  Height:      Weight:      SpO2: 100% 100% 99% 100%    Wt Readings from Last 3 Encounters:  03/29/15 77.111 kg (170 lb)  03/23/15 77.111 kg (170 lb)  12/13/14 77.565 kg (171 lb)     Intake/Output Summary (Last 24 hours) at 03/31/15 1310 Last data filed at 03/31/15 1111  Gross per 24 hour  Intake   3600 ml  Output    601 ml  Net   2999 ml    Physical Exam:   GENERAL: Critically ill-appearing  HEAD, EYES, EARS, NOSE AND THROAT: Atraumatic, normocephalic. . Pupils equal and reactive to light. Sclerae anicteric. No conjunctival injection. No oro-pharyngeal erythema.  NECK: Supple. There is no jugular venous distention. No bruits, no lymphadenopathy, no thyromegaly.  HEART: Regular rate and rhythm,. No murmurs, no rubs, no clicks.  LUNGS: Clear to auscultation bilaterally. No rales or rhonchi. No wheezes.  ABDOMEN: Soft, flat, nontender, nondistended. Has good bowel sounds. No hepatosplenomegaly appreciated.  EXTREMITIES: No evidence of any cyanosis, clubbing, or peripheral  edema.  +2 pedal and radial pulses bilaterally.  NEUROLOGIC: Confused and agitated SKIN: Moist and warm with no rashes appreciated.  Psych: Confused and agitated LN: No inguinal LN enlargement    Antibiotics   Anti-infectives    Start     Dose/Rate Route Frequency Ordered Stop   03/30/15 1330  levofloxacin (LEVAQUIN) IVPB 500 mg     500 mg 100 mL/hr over 60 Minutes Intravenous Daily-1800 03/30/15 1240     03/29/15 1950  valACYclovir (VALTREX) tablet 2,000  mg  Status:  Discontinued     2,000 mg Oral 2 times daily PRN 03/29/15 1952 03/31/15 1307      Medications   Scheduled Meds: . heparin  5,000 Units Subcutaneous 3 times per day  . levofloxacin (LEVAQUIN) IV  500 mg Intravenous q1800  . nystatin cream  1 application Topical BID  . olopatadine  1 drop Both Eyes BID  . valproate sodium  750 mg Intravenous Q12H   Continuous Infusions: . sodium chloride 125 mL/hr (03/31/15 0501)   PRN Meds:.sodium chloride, sodium chloride, alteplase, feeding supplement (NEPRO CARB STEADY), heparin, lidocaine (PF), lidocaine-prilocaine, LORazepam, pentafluoroprop-tetrafluoroeth   Data Review:   Micro Results Recent Results (from the past 240 hour(s))  MRSA PCR Screening     Status: None   Collection Time: 03/29/15  3:53 PM  Result Value Ref Range Status   MRSA by PCR NEGATIVE NEGATIVE Final    Comment:        The GeneXpert MRSA Assay (FDA approved for NASAL specimens only), is one component of a comprehensive MRSA colonization surveillance program. It is not intended to diagnose MRSA infection nor to guide or monitor treatment for MRSA infections.     Radiology Reports Dg Chest 2 View  03/29/2015   CLINICAL DATA:  Congestion after vomiting.  EXAM: CHEST  2 VIEW  COMPARISON:  None.  FINDINGS: Marked hypoventilation, limiting sensitivity. No gross asymmetry in aeration. No effusion or air leak. Normal heart size and mediastinal contours.  Stool dilated colon visualized in the upper  abdomen.  IMPRESSION: 1. Very limited low volume chest, cannot exclude aspiration pneumonitis. 2. Marked stool retention in the upper abdomen.   Electronically Signed   By: Marnee Spring M.D.   On: 03/29/2015 15:33   Mr Brain Wo Contrast  03/23/2015   CLINICAL DATA:  24 year old female recently diagnosed with ear infection status post antibiotics but continued ataxia, unsteady gait. Initial encounter.  EXAM: MRI HEAD WITHOUT CONTRAST  TECHNIQUE: Multiplanar, multiecho pulse sequences of the brain and surrounding structures were obtained without intravenous contrast.  COMPARISON:  [facility] Brain MRI 12/31/2012  FINDINGS: Rapid scanning technique employed.  Chronic widespread hyperostosis of the skull. Stable cerebral volume. Major intracranial vascular flow voids appear stable. No restricted diffusion or evidence of acute infarction. No ventriculomegaly (stable mild colpocephaly). No midline shift or intracranial mass lesion. No acute or chronic intracranial blood products identified. No encephalomalacia identified. Wallace Cullens and white matter signal appears stable. Negative pituitary and cervicomedullary junction. Stable visualized cervical spine.  There is new right ethmoid air cell fluid/opacification. Mild bilateral maxillary sinus mucosal thickening is new. Other Visualized paranasal sinuses and mastoids are clear. No tympanic cavity fluid. Chronic diminutive appearance of the IAC.  Orbits soft tissues appear stable and within normal limits. Scalp soft tissues appear normal.  IMPRESSION: 1. Stable noncontrast MRI appearance of the brain since 2014. 2. Mild-to-moderate paranasal sinus inflammation primarily at the right ethmoids. Mastoids and tympanic cavities are clear. 3. 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 If there are multiple cranial neuropathies consider cranial nerve entrapment such as in hyperostosis cranialis interna.   Electronically Signed   By: Odessa Fleming M.D.    On: 03/23/2015 14:03     CBC  Recent Labs Lab 03/29/15 1604 03/30/15 0536 03/31/15 0504  WBC 35.2* 29.7* 22.9*  HGB 11.4* 9.8* 10.3*  HCT 34.4* 29.9* 30.6*  PLT 129* 86* 84*  MCV 96.7 95.0 94.3  MCH 32.0 31.1 31.9  MCHC 33.1 32.7  33.8  RDW 14.1 13.8 13.5    Chemistries   Recent Labs Lab 03/29/15 1604 03/30/15 0536 03/30/15 1615 03/31/15 0504  NA 148* 141  --  137  K 3.5 2.9* 4.1 3.5  CL 116* 106  --  105  CO2 28 30  --  28  GLUCOSE 101* 134*  --  121*  BUN 42* 29*  --  23*  CREATININE 2.45* 1.72*  --  1.51*  CALCIUM 11.3* 9.0  --  9.9  MG  --  2.3  --  2.1  AST 37  --   --  32  ALT 24  --   --  21  ALKPHOS 115  --   --  108  BILITOT 0.4  --   --  0.6   ------------------------------------------------------------------------------------------------------------------ estimated creatinine clearance is 52.7 mL/min (by C-G formula based on Cr of 1.51). ------------------------------------------------------------------------------------------------------------------ No results for input(s): HGBA1C in the last 72 hours. ------------------------------------------------------------------------------------------------------------------ No results for input(s): CHOL, HDL, LDLCALC, TRIG, CHOLHDL, LDLDIRECT in the last 72 hours. ------------------------------------------------------------------------------------------------------------------ No results for input(s): TSH, T4TOTAL, T3FREE, THYROIDAB in the last 72 hours.  Invalid input(s): FREET3 ------------------------------------------------------------------------------------------------------------------ No results for input(s): VITAMINB12, FOLATE, FERRITIN, TIBC, IRON, RETICCTPCT in the last 72 hours.  Coagulation profile No results for input(s): INR, PROTIME in the last 168 hours.  No results for input(s): DDIMER in the last 72 hours.  Cardiac Enzymes No results for input(s): CKMB, TROPONINI, MYOGLOBIN in the last  168 hours.  Invalid input(s): CK ------------------------------------------------------------------------------------------------------------------ Invalid input(s): POCBNP    Assessment & Plan   24 year old female with past medical history of mental retardation, paranoid schizophrenia, hypertension, history of seizures, who presents to the hospital due to ataxic gait, altered mental status and noted to have lithium toxicity.  #1 altered mental status/encephalopathy-this is likely metabolic in nature and related to lithium toxicity. Plan for dialysis again today.  #2 lithium toxicity-this is likely the setting of patient's increasing dose and also probably underlying acute renal failure.  #3 ataxia-this is likely related to the patient's lithium toxicity. Unlikely dose of acute neurologic event.  #4 acute renal failure-this is likely in setting of dehydration and poor by mouth intake. Patient's renal function is stable creatinine possibly falsely low due to hemodialysis  #5 leukocytosis- due to altered mental status impaired Levaquin for now WBC trending down  #6 hypertension blood pressure meds on hold for now  #7 history of seizures-no evidence of acute seizure type activity. Change Depakote IV due to patient not able to take much by mouth  #8 severe hypok repalced  #9 severe constipation on xray, once awake can give meds to help with bm    Code Status Orders        Start     Ordered   03/29/15 1951  Full code   Continuous     03/29/15 1952           Consults  nephrology     Lab Results  Component Value Date   PLT 84* 03/31/2015     Time Spent in minutes   of critical care    Auburn Bilberry M.D on 03/31/2015 at 1:10 PM  Between 7am to 6pm - Pager - [phone]  After 6pm go to [email or web address] - password [email or web address] [facility]  [facility]   Office  [phone]

## 2015-03-31 NOTE — Progress Notes (Signed)
Unable to tolerate po intake, SLP ordered Dr. Allena Katz aware and pt make full NPO

## 2015-03-31 NOTE — Consult Note (Signed)
[facility] Consult   Reason for Consult:  Consult and follow-up for this 24 year old woman with mental retardation and chronic behavior problems who presented to the hospital with lithium toxicity Referring Physician:  Verdell Carmine Patient Identification: Hannah Keller MRN:  [id number] Principal Diagnosis: Lithium toxicity Diagnosis:   Patient Active Problem List   Diagnosis Date Noted  . Lithium toxicity [T56.891A] 03/29/2015  . 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 female patient admitted with patient is not able to offer any history at this time. Unclear how uncomfortable she might be.Marland Kitchen  HPI:  Follow-up on yesterday's note. This 24 year old woman was brought in by the staff at her family care home. They described what sounded like a week or 2 of worsening mental state with less verbal communication than normal and gradually increasing trouble walking to the point where she was not able to ambulate at all. There had not been any known changes to her medication in the last few weeks. A little over a month ago apparently she did have an increase in her prescribed lithium dose by 33% total. Also had recently started a modest dose of topiramate.  Patient has chronic intellectual disability. Probably in the moderate range. Not entirely clear to me from how the caretakers describe it but apparently she is able to talk somewhat and do some self-care at least when she is at her baseline. She's been on multiple medications suggesting diagnosis of other mental health conditions. Probably chronic behavior problems. Diagnosis of schizophrenia is offered here. Not entirely sure as we don't have a lot of other past psychiatric detail.  Medical history: Patient presented with a lithium level of 4.4 and an elevated creatinine of 2. She does have a apparent history of probably gastric reflux  possibly some form of herpes.  Social history: Patient has a legal guardian with the Department of Social Services. No known family involved. Has lived in the same family care home for many years and they are quite familiar with her.  Substance abuse history: None  Follow-up as of Wednesday the 31st. Patient today was able to respond verbally to me. She could say her name and she could answer brief yes and no questions. Lithium level most recent check down to 1.9. It continues to bounce down with dialysis and then rebound but is gradually improving. Creatinine much improved.  I note that she is getting IV valproate. I don't see an ammonia level having been checked since she came in. I am going to put in an order to check an ammonia level just to be sure that that is also not up as a result of her medicine. Otherwise no indication for any change in course of treatment. HPI Elements:   Quality:  Confusion and ataxia delirium. Severity:  Severe life-threatening. Timing:  Gradually progressive over at least a week. Duration:  Ongoing. Context:  Some increased lithium level or dose.  Past Medical History:  Past Medical History  Diagnosis Date  . Mental retardation     Mild  . Paranoid schizophrenia   . Seizures   . Developmental delay    History reviewed. No pertinent past surgical history. Family History: No family history on file. Social History:  History  Alcohol Use No     History  Drug Use No    Social History   Social History  . Marital Status: Single  Spouse Name: N/A  . Number of Children: 0  . Years of Education: n/a   Occupational History  . Disabled    Social History Main Topics  . Smoking status: Never Smoker   . Smokeless tobacco: Never Used  . Alcohol Use: No  . Drug Use: No  . Sexual Activity: Not Asked   Other Topics Concern  . None   Social History Narrative   Pt currently resides at [facility].   Caffeine Use: None   Additional Social  History:                          Allergies:   Allergies  Allergen Reactions  . Ritalin [Methylphenidate Hcl] Other (See Comments)    Reaction:  Unknown     Labs:  Results for orders placed or performed during the hospital encounter of 03/29/15 (from the past 48 hour(s))  Hepatitis B surface antigen     Status: None   Collection Time: 03/29/15 11:56 PM  Result Value Ref Range   Hepatitis B Surface Ag Negative Negative    Comment: (NOTE) Performed At: [facility] [place], [place] [id number] Lindon Romp MD [id number]   Hepatitis B surface antibody     Status: None   Collection Time: 03/29/15 11:56 PM  Result Value Ref Range   Hepatitis B-Post >1000.0 Immunity>9.9 mIU/mL    Comment: (NOTE)  Status of Immunity                     Anti-HBs Level  ------------------                     -------------- Inconsistent with Immunity                   0.0 - 9.9 Consistent with Immunity                          >9.9 Performed At: [facility] [place], [place] [id number] Lindon Romp MD [id number]   Hepatitis C antibody     Status: None   Collection Time: 03/29/15 11:56 PM  Result Value Ref Range   HCV Ab <0.1 0.0 - 0.9 s/co ratio    Comment: (NOTE)                                  Negative:     < 0.8                             Indeterminate: 0.8 - 0.9                                  Positive:     > 0.9 The CDC recommends that a positive HCV antibody result be followed up with a HCV Nucleic Acid Amplification test ([id number]). Performed At: [facility] [address] [place], [place] [id number] Lindon Romp MD [id number]   Hepatitis B core antibody, total     Status: None   Collection Time: 03/29/15 11:56 PM  Result Value Ref Range   Hep B Core Total Ab Negative Negative    Comment: (NOTE) Performed At: [facility] [place],  [place] [id number] Lindon Romp MD  [id number]   Lithium level     Status: Abnormal   Collection Time: 03/30/15  1:47 AM  Result Value Ref Range   Lithium Lvl 1.80 (HH) 0.60 - 1.20 mmol/L    Comment: CRITICAL RESULT CALLED TO, READ BACK BY AND VERIFIED WITH BEN CASSIDY 03/30/2015 0320 [facility]  Basic metabolic panel     Status: Abnormal   Collection Time: 03/30/15  5:36 AM  Result Value Ref Range   Sodium 141 135 - 145 mmol/L   Potassium 2.9 (LL) 3.5 - 5.1 mmol/L    Comment: CRITICAL RESULT CALLED TO, READ BACK BY AND VERIFIED WITH BEN CASSIDY 03/30/2015 0609 [facility]   Chloride 106 101 - 111 mmol/L   CO2 30 22 - 32 mmol/L   Glucose, Bld 134 (H) 65 - 99 mg/dL   BUN 29 (H) 6 - 20 mg/dL   Creatinine, Ser 1.72 (H) 0.44 - 1.00 mg/dL   Calcium 9.0 8.9 - 10.3 mg/dL   GFR calc non Af Amer 41 (L) >60 mL/min   GFR calc Af Amer 47 (L) >60 mL/min    Comment: (NOTE) The eGFR has been calculated using the CKD EPI equation. This calculation has not been validated in all clinical situations. eGFR's persistently <60 mL/min signify possible Chronic Kidney Disease.    Anion gap 5 5 - 15  CBC     Status: Abnormal   Collection Time: 03/30/15  5:36 AM  Result Value Ref Range   WBC 29.7 (H) 3.6 - 11.0 K/uL   RBC 3.15 (L) 3.80 - 5.20 MIL/uL   Hemoglobin 9.8 (L) 12.0 - 16.0 g/dL   HCT 29.9 (L) 35.0 - 47.0 %   MCV 95.0 80.0 - 100.0 fL   MCH 31.1 26.0 - 34.0 pg   MCHC 32.7 32.0 - 36.0 g/dL   RDW 13.8 11.5 - 14.5 %   Platelets 86 (L) 150 - 440 K/uL  Lactic acid, plasma     Status: None   Collection Time: 03/30/15  5:36 AM  Result Value Ref Range   Lactic Acid, Venous 0.9 0.5 - 2.0 mmol/L  Magnesium     Status: None   Collection Time: 03/30/15  5:36 AM  Result Value Ref Range   Magnesium 2.3 1.7 - 2.4 mg/dL  Phosphorus     Status: None   Collection Time: 03/30/15  5:36 AM  Result Value Ref Range   Phosphorus 3.6 2.5 - 4.6 mg/dL  Lithium level     Status: Abnormal   Collection Time: 03/30/15  9:03 AM  Result Value Ref Range    Lithium Lvl 2.90 (HH) 0.60 - 1.20 mmol/L    Comment: CRITICAL RESULT CALLED TO, READ BACK BY AND VERIFIED WITH LYDIA YOUNG AT 0949 ON 03/30/15 BY MLZ   Potassium     Status: None   Collection Time: 03/30/15  4:15 PM  Result Value Ref Range   Potassium 4.1 3.5 - 5.1 mmol/L  Lithium level     Status: Abnormal   Collection Time: 03/30/15  4:15 PM  Result Value Ref Range   Lithium Lvl <0.06 (L) 0.60 - 1.20 mmol/L    Comment: RESULTS VERIFIED BY REPEAT TESTING  Lithium level     Status: Abnormal   Collection Time: 03/30/15 11:58 PM  Result Value Ref Range   Lithium Lvl 1.80 (HH) 0.60 - 1.20 mmol/L    Comment: CRITICAL RESULT CALLED TO, READ BACK BY AND VERIFIED WITH BRYAN HADDOCK 03/31/2015 0051  [facility]  Lithium level     Status: Abnormal   Collection Time: 03/31/15  1:47 AM  Result Value Ref Range   Lithium Lvl 1.90 (HH) 0.60 - 1.20 mmol/L    Comment: CRITICAL RESULT CALLED TO, READ BACK BY AND VERIFIED WITH KATHRYN CLAYTON AT 1240 03/31/15 DAS   Magnesium     Status: None   Collection Time: 03/31/15  5:04 AM  Result Value Ref Range   Magnesium 2.1 1.7 - 2.4 mg/dL  Phosphorus     Status: None   Collection Time: 03/31/15  5:04 AM  Result Value Ref Range   Phosphorus 3.2 2.5 - 4.6 mg/dL  CBC     Status: Abnormal   Collection Time: 03/31/15  5:04 AM  Result Value Ref Range   WBC 22.9 (H) 3.6 - 11.0 K/uL   RBC 3.24 (L) 3.80 - 5.20 MIL/uL   Hemoglobin 10.3 (L) 12.0 - 16.0 g/dL   HCT 30.6 (L) 35.0 - 47.0 %   MCV 94.3 80.0 - 100.0 fL   MCH 31.9 26.0 - 34.0 pg   MCHC 33.8 32.0 - 36.0 g/dL   RDW 13.5 11.5 - 14.5 %   Platelets 84 (L) 150 - 440 K/uL  Comprehensive metabolic panel     Status: Abnormal   Collection Time: 03/31/15  5:04 AM  Result Value Ref Range   Sodium 137 135 - 145 mmol/L   Potassium 3.5 3.5 - 5.1 mmol/L   Chloride 105 101 - 111 mmol/L   CO2 28 22 - 32 mmol/L   Glucose, Bld 121 (H) 65 - 99 mg/dL   BUN 23 (H) 6 - 20 mg/dL   Creatinine, Ser 1.51 (H) 0.44 - 1.00 mg/dL     Calcium 9.9 8.9 - 10.3 mg/dL   Total Protein 5.5 (L) 6.5 - 8.1 g/dL   Albumin 2.5 (L) 3.5 - 5.0 g/dL   AST 32 15 - 41 U/L   ALT 21 14 - 54 U/L   Alkaline Phosphatase 108 38 - 126 U/L   Total Bilirubin 0.6 0.3 - 1.2 mg/dL   GFR calc non Af Amer 47 (L) >60 mL/min   GFR calc Af Amer 55 (L) >60 mL/min    Comment: (NOTE) The eGFR has been calculated using the CKD EPI equation. This calculation has not been validated in all clinical situations. eGFR's persistently <60 mL/min signify possible Chronic Kidney Disease.    Anion gap 4 (L) 5 - 15  Renal function panel     Status: Abnormal   Collection Time: 03/31/15  2:44 PM  Result Value Ref Range   Sodium 137 135 - 145 mmol/L   Potassium 4.0 3.5 - 5.1 mmol/L   Chloride 105 101 - 111 mmol/L   CO2 29 22 - 32 mmol/L   Glucose, Bld 116 (H) 65 - 99 mg/dL   BUN 19 6 - 20 mg/dL   Creatinine, Ser 1.18 (H) 0.44 - 1.00 mg/dL   Calcium 9.8 8.9 - 10.3 mg/dL   Phosphorus 2.5 2.5 - 4.6 mg/dL   Albumin 2.6 (L) 3.5 - 5.0 g/dL   GFR calc non Af Amer >60 >60 mL/min   GFR calc Af Amer >60 >60 mL/min    Comment: (NOTE) The eGFR has been calculated using the CKD EPI equation. This calculation has not been validated in all clinical situations. eGFR's persistently <60 mL/min signify possible Chronic Kidney Disease.    Anion gap 3 (L) 5 - 15  CBC     Status:  Abnormal   Collection Time: 03/31/15  2:44 PM  Result Value Ref Range   WBC 16.4 (H) 3.6 - 11.0 K/uL   RBC 3.36 (L) 3.80 - 5.20 MIL/uL   Hemoglobin 10.4 (L) 12.0 - 16.0 g/dL   HCT 31.6 (L) 35.0 - 47.0 %   MCV 94.1 80.0 - 100.0 fL   MCH 30.9 26.0 - 34.0 pg   MCHC 32.9 32.0 - 36.0 g/dL   RDW 13.3 11.5 - 14.5 %   Platelets 87 (L) 150 - 440 K/uL    Vitals: Blood pressure 123/68, pulse 107, temperature 97.8 F (36.6 C), temperature source Oral, resp. rate 19, height 5' (1.524 m), weight 70.9 kg (156 lb 4.9 oz), SpO2 100 %.  Risk to Self: Is patient at risk for suicide?: No Risk to Others:    Prior Inpatient Therapy:   Prior Outpatient Therapy:    Current Facility-Administered Medications  Medication Dose Route Frequency Provider Last Rate Last Dose  . 0.45 % sodium chloride infusion   Intravenous Continuous Munsoor Lateef, MD 125 mL/hr at 03/31/15 1320    . 0.9 %  sodium chloride infusion  100 mL Intravenous PRN Munsoor Lateef, MD      . 0.9 %  sodium chloride infusion  100 mL Intravenous PRN Munsoor Lateef, MD      . alteplase (CATHFLO ACTIVASE) injection 2 mg  2 mg Intracatheter Once PRN Munsoor Lateef, MD      . feeding supplement (NEPRO CARB STEADY) liquid 237 mL  237 mL Oral PRN Munsoor Lateef, MD      . heparin injection 1,000 Units  1,000 Units Dialysis PRN Munsoor Lateef, MD   3,600 Units at 03/31/15 1636  . heparin injection 5,000 Units  5,000 Units Subcutaneous 3 times per day Henreitta Leber, MD   5,000 Units at 03/31/15 2127  . levofloxacin (LEVAQUIN) IVPB 500 mg  500 mg Intravenous q1800 Dustin Flock, MD   500 mg at 03/31/15 1734  . lidocaine (PF) (XYLOCAINE) 1 % injection 5 mL  5 mL Intradermal PRN Munsoor Lateef, MD      . lidocaine-prilocaine (EMLA) cream 1 application  1 application Topical PRN Munsoor Lateef, MD      . LORazepam (ATIVAN) injection 2 mg  2 mg Intravenous Q4H PRN Lytle Butte, MD   2 mg at 03/31/15 1533  . nystatin cream (MYCOSTATIN) 1 application  1 application Topical BID Henreitta Leber, MD   1 application at 35/70/17 2127  . olopatadine (PATANOL) 0.1 % ophthalmic solution 1 drop  1 drop Both Eyes BID Henreitta Leber, MD   1 drop at 03/31/15 2128  . pentafluoroprop-tetrafluoroeth (GEBAUERS) aerosol 1 application  1 application Topical PRN Munsoor Lateef, MD      . valproate (DEPACON) 750 mg in dextrose 5 % 50 mL IVPB  750 mg Intravenous Q12H Dustin Flock, MD   750 mg at 03/31/15 2127    Musculoskeletal: Strength & Muscle Tone: decreased Gait & Station: unable to stand Patient leans: N/A  Psychiatric Specialty Exam: Physical Exam   Nursing note and vitals reviewed. Constitutional: She appears well-developed and well-nourished. She appears lethargic. She is uncooperative. She appears toxic.  HENT:  Head: Normocephalic and atraumatic.    Eyes: Conjunctivae are normal. Pupils are equal, round, and reactive to light.  Neck: Normal range of motion.  Cardiovascular: Normal heart sounds.   Respiratory: Effort normal.  GI: Soft.  Musculoskeletal: Normal range of motion.  Neurological: She appears lethargic.  Skin:  Skin is warm and dry.  Psychiatric: Her affect is not labile. Her speech is slurred. She is slowed. She is not agitated. Cognition and memory are impaired. She does not express impulsivity. She is communicative.  Patient is responding verbally today although only with slightly articulate sounds. Seems to be able to answer yes and no questions little bit better. Certainly much less agitated    Review of Systems  Unable to perform ROS   Blood pressure 123/68, pulse 107, temperature 97.8 F (36.6 C), temperature source Oral, resp. rate 19, height 5' (1.524 m), weight 70.9 kg (156 lb 4.9 oz), SpO2 100 %.Body mass index is 30.53 kg/(m^2).  General Appearance: Fairly Groomed  Engineer, water::  Minimal  Speech:  Garbled  Volume:  Decreased  Mood:  Not stated  Affect:  Flat  Thought Process:  Not able to really identify  Orientation:  Negative  Thought Content:  Negative  Suicidal Thoughts:  No  Homicidal Thoughts:  No  Memory:  Negative  Judgement:  Impaired  Insight:  Lacking  Psychomotor Activity:  Decreased  Concentration:  Poor  Recall:  Poor  Fund of Knowledge:Poor  Language: Poor  Akathisia:  No  Handed:  Right  AIMS (if indicated):     Assets:  Housing Social Support  ADL's:  Impaired  Cognition: Impaired,  Severe  Sleep:      Medical Decision Making: New problem, with additional work up planned, Review or order clinical lab tests (1), Review or order medicine tests (1) and Review of  Medication Regimen & Side Effects (2)  Treatment Plan Summary: Plan Looks like she is gradually improving. Still unknown how much of her mental state she will completely retain. Continue trying to get the lithium out. Check the ammonia level tonight. I will continue to follow-up.  Plan:  Continue monitoring. Disposition: No need for psychiatric hospitalization  Alethia Berthold 03/31/2015 9:46 PM

## 2015-03-31 NOTE — Progress Notes (Signed)
[facility]  ROUNDING NOTE   Subjective:  Pt still agitated at times. Spontaneously moving around in bed. Can't answer questions accurately. Lithium levels continue to rebound, most recent lithium level was 1.8.  Objective:  Vital signs in last 24 hours:  Temp:  [97.1 F (36.2 C)-98.2 F (36.8 C)] 98.2 F (36.8 C) (08/31 0700) Pulse Rate:  [73-108] 95 (08/31 0900) Resp:  [14-39] 39 (08/31 0900) BP: (80-125)/(44-80) 125/44 mmHg (08/31 0900) SpO2:  [92 %-100 %] 96 % (08/31 0900)  Weight change:  Filed Weights   03/29/15 1454  Weight: 77.111 kg (170 lb)    Intake/Output: I/O last 3 completed shifts: In: 4825 [I.V.:4125; IV Piggyback:700] Out: 851 [Urine:1300]   Intake/Output this shift:     Physical Exam: General: agitated  Head: Normocephalic, atraumatic. Moist oral mucosal membranes  Eyes: Anicteric  Neck: Supple, trachea midline  Lungs:  Clear to auscultation normal effort  Heart: S1S2 tachycardic  Abdomen:  Soft, nontender, BS present  Extremities: no peripheral edema.  Neurologic: Awake but very agitated  Skin: No lesions  Access: Right femoral temp HD catheter    Basic Metabolic Panel:  Recent Labs Lab 03/29/15 1604 03/30/15 0536 03/30/15 1615 03/31/15 0504  NA 148* 141  --  137  K 3.5 2.9* 4.1 3.5  CL 116* 106  --  105  CO2 28 30  --  28  GLUCOSE 101* 134*  --  121*  BUN 42* 29*  --  23*  CREATININE 2.45* 1.72*  --  1.51*  CALCIUM 11.3* 9.0  --  9.9  MG  --  2.3  --  2.1  PHOS  --  3.6  --  3.2    Liver Function Tests:  Recent Labs Lab 03/29/15 1604 03/31/15 0504  AST 37 32  ALT 24 21  ALKPHOS 115 108  BILITOT 0.4 0.6  PROT 7.0 5.5*  ALBUMIN 3.3* 2.5*    Recent Labs Lab 03/29/15 1604  LIPASE 300*   No results for input(s): AMMONIA in the last 168 hours.  CBC:  Recent Labs Lab 03/29/15 1604 03/30/15 0536 03/31/15 0504  WBC 35.2* 29.7* 22.9*  HGB 11.4* 9.8* 10.3*  HCT 34.4* 29.9* 30.6*  MCV 96.7 95.0  94.3  PLT 129* 86* 84*    Cardiac Enzymes: No results for input(s): CKTOTAL, CKMB, CKMBINDEX, TROPONINI in the last 168 hours.  BNP: Invalid input(s): POCBNP  CBG:  Recent Labs Lab 03/29/15 2018  GLUCAP 76    Microbiology: Results for orders placed or performed during the hospital encounter of 03/29/15  MRSA PCR Screening     Status: None   Collection Time: 03/29/15  3:53 PM  Result Value Ref Range Status   MRSA by PCR NEGATIVE NEGATIVE Final    Comment:        The GeneXpert MRSA Assay (FDA approved for NASAL specimens only), is one component of a comprehensive MRSA colonization surveillance program. It is not intended to diagnose MRSA infection nor to guide or monitor treatment for MRSA infections.     Coagulation Studies: No results for input(s): LABPROT, INR in the last 72 hours.  Urinalysis:  Recent Labs  03/29/15 1604  COLORURINE YELLOW*  LABSPEC 1.015  PHURINE 5.0  GLUCOSEU NEGATIVE  HGBUR NEGATIVE  BILIRUBINUR NEGATIVE  KETONESUR NEGATIVE  PROTEINUR NEGATIVE  NITRITE NEGATIVE  LEUKOCYTESUR NEGATIVE      Imaging: Dg Chest 2 View  03/29/2015   CLINICAL DATA:  Congestion after vomiting.  EXAM: CHEST  2  VIEW  COMPARISON:  None.  FINDINGS: Marked hypoventilation, limiting sensitivity. No gross asymmetry in aeration. No effusion or air leak. Normal heart size and mediastinal contours.  Stool dilated colon visualized in the upper abdomen.  IMPRESSION: 1. Very limited low volume chest, cannot exclude aspiration pneumonitis. 2. Marked stool retention in the upper abdomen.   Electronically Signed   By: Marnee Spring M.D.   On: 03/29/2015 15:33     Medications:   . sodium chloride 125 mL/hr (03/31/15 0501)   . ARIPiprazole  15 mg Oral Q1200  . asenapine  10 mg Sublingual TID  . benztropine  0.5 mg Oral QHS  . cholecalciferol  1,000 Units Oral BID  . darifenacin  7.5 mg Oral Daily  . divalproex  750 mg Oral BID  . docusate sodium  200 mg Oral  Daily  . ferrous sulfate  325 mg Oral Daily  . heparin  5,000 Units Subcutaneous 3 times per day  . Influenza vac split quadrivalent PF  0.5 mL Intramuscular Tomorrow-1000  . levofloxacin (LEVAQUIN) IV  500 mg Intravenous q1800  . nystatin cream  1 application Topical BID  . olopatadine  1 drop Both Eyes BID  . PARoxetine  20 mg Oral Daily  . polyethylene glycol  17 g Oral QODAY  . potassium chloride  10 mEq Intravenous Q1 Hr x 4  . QUEtiapine  50 mg Oral TID   sodium chloride, sodium chloride, acetaminophen **OR** acetaminophen, acetaminophen, alteplase, feeding supplement (NEPRO CARB STEADY), heparin, lidocaine (PF), lidocaine-prilocaine, LORazepam, meclizine, ondansetron **OR** ondansetron (ZOFRAN) IV, pentafluoroprop-tetrafluoroeth, valACYclovir  Assessment/ Plan:  24 y.o. female with a PMHx of mental retardation, paranoid schizophrenia, seizures, developmental delay, who was admitted to [facility] on 03/29/2015 for evaluation of ataxia, nausea, vomiting, and altered mental status.   1. Lithium toxicity R78.89 2. Acute renal failure. 3. Hypernatremia. 4. Hypotension. 5.  Hypokalemia.  Plan:  Lithium levels continue to rebound post dialysis which is to be expected.  Pt still quite agitated and her mental status not at baseline.  As such we will plan for additional dialysis.  Will plan for HD for 2.5 hours, BFR 250, DFR 500, no ultrafiltration.  K up to 3.5 this AM, will use 4K bath again today.  Hopefully her mental status will improve as additional lithium is removed.  Will follow closely.

## 2015-04-01 ENCOUNTER — Inpatient Hospital Stay (HOSPITAL_COMMUNITY)
Admit: 2015-04-01 | Discharge: 2015-04-01 | Disposition: A | Discharge status: Home / Self Care | Payer: Medicaid Other | Attending: Internal Medicine | Admitting: Internal Medicine

## 2015-04-01 DIAGNOSIS — R001 Bradycardia, unspecified: Secondary | ICD-10-CM

## 2015-04-01 DIAGNOSIS — R0600 Dyspnea, unspecified: Secondary | ICD-10-CM

## 2015-04-01 LAB — BLOOD GAS, ARTERIAL
ACID-BASE EXCESS: 5.4 mmol/L — AB (ref 0.0–3.0)
Allens test (pass/fail): POSITIVE — AB
BICARBONATE: 29.9 meq/L — AB (ref 21.0–28.0)
FIO2: 0.21
O2 SAT: 92 %
PATIENT TEMPERATURE: 37
PH ART: 7.46 — AB (ref 7.350–7.450)
pCO2 arterial: 42 mmHg (ref 32.0–48.0)
pO2, Arterial: 60 mmHg — ABNORMAL LOW (ref 83.0–108.0)

## 2015-04-01 LAB — RENAL FUNCTION PANEL
ANION GAP: 5 (ref 5–15)
Albumin: 2.3 g/dL — ABNORMAL LOW (ref 3.5–5.0)
BUN: 16 mg/dL (ref 6–20)
CALCIUM: 10.5 mg/dL — AB (ref 8.9–10.3)
CO2: 30 mmol/L (ref 22–32)
Chloride: 105 mmol/L (ref 101–111)
Creatinine, Ser: 1.4 mg/dL — ABNORMAL HIGH (ref 0.44–1.00)
GFR, EST NON AFRICAN AMERICAN: 52 mL/min — AB (ref >60)
Glucose, Bld: 129 mg/dL — ABNORMAL HIGH (ref 65–99)
Phosphorus: 3.3 mg/dL (ref 2.5–4.6)
Potassium: 3.7 mmol/L (ref 3.5–5.1)
SODIUM: 140 mmol/L (ref 135–145)

## 2015-04-01 LAB — TROPONIN I: Troponin I: <0.03 ng/mL (ref <0.031)

## 2015-04-01 LAB — AMMONIA: AMMONIA: 40 umol/L — AB (ref 9–35)

## 2015-04-01 LAB — BASIC METABOLIC PANEL
Anion gap: 5 (ref 5–15)
BUN: 16 mg/dL (ref 6–20)
CHLORIDE: 106 mmol/L (ref 101–111)
CO2: 29 mmol/L (ref 22–32)
CREATININE: 1.48 mg/dL — AB (ref 0.44–1.00)
Calcium: 10.4 mg/dL — ABNORMAL HIGH (ref 8.9–10.3)
GFR calc non Af Amer: 49 mL/min — ABNORMAL LOW (ref >60)
GFR, EST AFRICAN AMERICAN: 56 mL/min — AB (ref >60)
Glucose, Bld: 129 mg/dL — ABNORMAL HIGH (ref 65–99)
POTASSIUM: 3.7 mmol/L (ref 3.5–5.1)
Sodium: 140 mmol/L (ref 135–145)

## 2015-04-01 LAB — MAGNESIUM: Magnesium: 2.1 mg/dL (ref 1.7–2.4)

## 2015-04-01 LAB — POCT PREGNANCY, URINE: PREG TEST UR: NEGATIVE

## 2015-04-01 LAB — LITHIUM LEVEL: Lithium Lvl: 1.13 mmol/L (ref 0.60–1.20)

## 2015-04-01 MED ORDER — DOPAMINE-DEXTROSE 3.2-5 MG/ML-% IV SOLN
2.0000 ug/kg/min | INTRAVENOUS | Status: DC
  Administered 2015-04-01: 2 ug/kg/min via INTRAVENOUS
  Filled 2015-04-01: qty 250

## 2015-04-01 NOTE — Progress Notes (Signed)
Speech Therapy Note: reviewed chart notes noting the degree of constipation; pt initially did have a vomiting episode per chart notes. Pt is awaking some today per MD note. Due to pt's increased risk for aspiration, rec. holding off on performing BSE and initiating an oral diet until pt is more awake/alert to safely participate. Rec. continued f/u for the constipation to reduce any impact on swallowing overall. Of note, pt demo. Significant Esophageal dysmotility during a recent MBSS; see chart notes. Rec. GI f/u d/t this.

## 2015-04-01 NOTE — Care Management Note (Signed)
Case Management Note  Patient Details  Name: Hannah Keller MRN: [id number] Date of Birth: Dec 09, 1990  Subjective/Objective:   Admitted with lithium toxicity from [facility] in [facility], has resided there for many years. Contact information in demographics.  [facility] DSS is patients guardian. No family involved. HX mental retardation with chronic behavioral problems, paranoid scizophrenia. Receiving dialysis with improvement in kidney function and lithium level.  CSW consult initiated.                  Action/Plan: Return to group home when medically stable  Expected Discharge Date:                  Expected Discharge Plan:  Group Home  In-House Referral:  Clinical Social Work  Discharge planning Services  CM Consult  Post Acute Care Choice:    Choice offered to:     DME Arranged:    DME Agency:     HH Arranged:    HH Agency:     Status of Service:  In process, will continue to follow  Medicare Important Message Given:    Date Medicare IM Given:    Medicare IM give by:    Date Additional Medicare IM Given:    Additional Medicare Important Message give by:     If discussed at Long Length of Stay Meetings, dates discussed:    Additional Comments:  Marily Memos, RN 04/01/2015, 8:55 AM

## 2015-04-01 NOTE — Progress Notes (Signed)
[facility]  ROUNDING NOTE   Subjective:  Pt resting a bit more comfortably this AM. Lithium level down to 1.1 post dialysis.   Objective:  Vital signs in last 24 hours:  Temp:  [97.8 F (36.6 C)-98.5 F (36.9 C)] 97.8 F (36.6 C) (08/31 1800) Pulse Rate:  [77-134] 84 (09/01 0300) Resp:  [16-41] 19 (09/01 0600) BP: (87-157)/(37-147) 157/147 mmHg (09/01 0500) SpO2:  [92 %-100 %] 97 % (09/01 0300) Weight:  [70.9 kg (156 lb 4.9 oz)] 70.9 kg (156 lb 4.9 oz) (08/31 1415)  Weight change:  Filed Weights   03/29/15 1454 03/31/15 1415  Weight: 77.111 kg (170 lb) 70.9 kg (156 lb 4.9 oz)    Intake/Output: I/O last 3 completed shifts: In: 4925 [I.V.:4375; IV Piggyback:550] Out: 2476 [Urine:2475; Stool:1]   Intake/Output this shift:     Physical Exam: General: Resting comfortably in bed  Head: Normocephalic, atraumatic. Moist oral mucosal membranes  Eyes: Anicteric  Neck: Supple, trachea midline  Lungs:  Clear to auscultation normal effort  Heart: S1S2 no rubs  Abdomen:  Soft, nontender, BS present  Extremities: no peripheral edema.  Neurologic: Awake but very agitated  Skin: No lesions  Access: Right femoral temp HD catheter    Basic Metabolic Panel:  Recent Labs Lab 03/29/15 1604 03/30/15 0536 03/30/15 1615 03/31/15 0504 03/31/15 1444 04/01/15 0512  NA 148* 141  --  137 137 140  140  K 3.5 2.9* 4.1 3.5 4.0 3.7  3.7  CL 116* 106  --  105 105 105  106  CO2 28 30  --  GLUCOSE 101* 134*  --  121* 116* 129*  129*  BUN 42* 29*  --  23* CREATININE 2.45* 1.72*  --  1.51* 1.18* 1.40*  1.48*  CALCIUM 11.3* 9.0  --  9.9 9.8 10.5*  10.4*  MG  --  2.3  --  2.1  --   --   PHOS  --  3.6  --  3.2 2.5 3.3    Liver Function Tests:  Recent Labs Lab 03/29/15 1604 03/31/15 0504 03/31/15 1444 04/01/15 0512  AST 37 32  --   --   ALT 24 21  --   --   ALKPHOS 115 108  --   --   BILITOT 0.4 0.6  --   --   PROT 7.0 5.5*  --    --   ALBUMIN 3.3* 2.5* 2.6* 2.3*    Recent Labs Lab 03/29/15 1604  LIPASE 300*    Recent Labs Lab 03/31/15 2355  AMMONIA 40*    CBC:  Recent Labs Lab 03/29/15 1604 03/30/15 0536 03/31/15 0504 03/31/15 1444  WBC 35.2* 29.7* 22.9* 16.4*  HGB 11.4* 9.8* 10.3* 10.4*  HCT 34.4* 29.9* 30.6* 31.6*  MCV 96.7 95.0 94.3 94.1  PLT 129* 86* 84* 87*    Cardiac Enzymes: No results for input(s): CKTOTAL, CKMB, CKMBINDEX, TROPONINI in the last 168 hours.  BNP: Invalid input(s): POCBNP  CBG:  Recent Labs Lab 03/29/15 2018  GLUCAP 76    Microbiology: Results for orders placed or performed during the hospital encounter of 03/29/15  MRSA PCR Screening     Status: None   Collection Time: 03/29/15  3:53 PM  Result Value Ref Range Status   MRSA by PCR NEGATIVE NEGATIVE Final    Comment:        The GeneXpert MRSA Assay (FDA approved for NASAL specimens  only), is one component of a comprehensive MRSA colonization surveillance program. It is not intended to diagnose MRSA infection nor to guide or monitor treatment for MRSA infections.     Coagulation Studies: No results for input(s): LABPROT, INR in the last 72 hours.  Urinalysis:  Recent Labs  03/29/15 1604  COLORURINE YELLOW*  LABSPEC 1.015  PHURINE 5.0  GLUCOSEU NEGATIVE  HGBUR NEGATIVE  BILIRUBINUR NEGATIVE  KETONESUR NEGATIVE  PROTEINUR NEGATIVE  NITRITE NEGATIVE  LEUKOCYTESUR NEGATIVE      Imaging: No results found.   Medications:   . sodium chloride 125 mL/hr at 03/31/15 1320   . heparin  5,000 Units Subcutaneous 3 times per day  . levofloxacin (LEVAQUIN) IV  500 mg Intravenous q1800  . nystatin cream  1 application Topical BID  . olopatadine  1 drop Both Eyes BID  . valproate sodium  750 mg Intravenous Q12H   sodium chloride, sodium chloride, alteplase, feeding supplement (NEPRO CARB STEADY), heparin, lidocaine (PF), lidocaine-prilocaine, LORazepam,  pentafluoroprop-tetrafluoroeth  Assessment/ Plan:  24 y.o. female with a PMHx of mental retardation, paranoid schizophrenia, seizures, developmental delay, who was admitted to [facility] on 03/29/2015 for evaluation of ataxia, nausea, vomiting, and altered mental status.   1. Lithium toxicity R78.89 2. Acute renal failure, suspect element of CKD as well. 3. Hypernatremia, resolved. 4. Hypotension. 5.  Hypokalemia. 6.  Thrombocytopenia.    Plan:  Lithium levels down to 1.1 this AM.  Renal function improved from admission, but there appears to be an element of CKD as well, which is most likely secondary to chronic lithium usage.  She is currently off of lithium.  Would suggest alternate psychotropics but defer this to psychiatry.  Would continue IVF hydration for now.  Thrombocytopenia noted, would suggest stopping heparin.

## 2015-04-01 NOTE — Progress Notes (Signed)
[facility] [place] [facility]                                                                                                                                                                                            Patient Demographics   Hannah Keller, is a 24 y.o. female, DOB - 1990/09/12, ZOX:[id number]  Admit date - 03/29/2015   Admitting Physician Houston Siren, MD  Outpatient Primary MD for the patient is Domenic Schwab, FNP   LOS - 3  Subjective: Patient started having pauses since last night. Was started on dopamine due to low blood pressure and low heart rate. Currently more awake    Review of Systems:   Due to mental retardation unable to provide histrory  Vitals:   Filed Vitals:   04/01/15 0700 04/01/15 0800 04/01/15 0900 04/01/15 1000  BP: 101/71 72/58 106/73 111/76  Pulse:    78  Temp:  97.9 F (36.6 C)    TempSrc:  Axillary    Resp: Height:      Weight:      SpO2:  98%  96%    Wt Readings from Last 3 Encounters:  03/31/15 70.9 kg (156 lb 4.9 oz)  03/23/15 77.111 kg (170 lb)  12/13/14 77.565 kg (171 lb)     Intake/Output Summary (Last 24 hours) at 04/01/15 1337 Last data filed at 04/01/15 0800  Gross per 24 hour  Intake   2650 ml  Output   2426 ml  Net    224 ml    Physical Exam:   GENERAL: Critically ill-appearing  HEAD, EYES, EARS, NOSE AND THROAT: Atraumatic, normocephalic. . Pupils equal and reactive to light. Sclerae anicteric. No conjunctival injection. No oro-pharyngeal erythema.  NECK: Supple. There is no jugular venous distention. No bruits, no lymphadenopathy, no thyromegaly.  HEART: Regular rate and rhythm,. No murmurs, no rubs, no clicks.  LUNGS: Clear to auscultation bilaterally. No rales or rhonchi. No wheezes.  ABDOMEN: Soft, flat, nontender, nondistended. Has good bowel sounds. No hepatosplenomegaly appreciated.  EXTREMITIES: No evidence of any cyanosis, clubbing, or peripheral  edema.  +2 pedal and radial pulses bilaterally.  NEUROLOGIC: Confused and agitated SKIN: Moist and warm with no rashes appreciated.  Psych: Confused and agitated LN: No inguinal LN enlargement    Antibiotics   Anti-infectives    Start     Dose/Rate Route Frequency Ordered Stop   03/30/15 1330  levofloxacin (LEVAQUIN) IVPB 500 mg     500 mg 100 mL/hr over 60 Minutes Intravenous Daily-1800 03/30/15 1240     03/29/15 1950  valACYclovir (  VALTREX) tablet 2,000 mg  Status:  Discontinued     2,000 mg Oral 2 times daily PRN 03/29/15 1952 03/31/15 1307      Medications   Scheduled Meds: . heparin  5,000 Units Subcutaneous 3 times per day  . levofloxacin (LEVAQUIN) IV  500 mg Intravenous q1800  . nystatin cream  1 application Topical BID  . olopatadine  1 drop Both Eyes BID  . valproate sodium  750 mg Intravenous Q12H   Continuous Infusions: . sodium chloride 125 mL/hr (04/01/15 0717)  . DOPamine 2 mcg/kg/min (04/01/15 0944)   PRN Meds:.sodium chloride, sodium chloride, alteplase, feeding supplement (NEPRO CARB STEADY), heparin, lidocaine (PF), lidocaine-prilocaine, LORazepam, pentafluoroprop-tetrafluoroeth   Data Review:   Micro Results Recent Results (from the past 240 hour(s))  MRSA PCR Screening     Status: None   Collection Time: 03/29/15  3:53 PM  Result Value Ref Range Status   MRSA by PCR NEGATIVE NEGATIVE Final    Comment:        The GeneXpert MRSA Assay (FDA approved for NASAL specimens only), is one component of a comprehensive MRSA colonization surveillance program. It is not intended to diagnose MRSA infection nor to guide or monitor treatment for MRSA infections.     Radiology Reports Dg Chest 2 View  03/29/2015   CLINICAL DATA:  Congestion after vomiting.  EXAM: CHEST  2 VIEW  COMPARISON:  None.  FINDINGS: Marked hypoventilation, limiting sensitivity. No gross asymmetry in aeration. No effusion or air leak. Normal heart size and mediastinal contours.   Stool dilated colon visualized in the upper abdomen.  IMPRESSION: 1. Very limited low volume chest, cannot exclude aspiration pneumonitis. 2. Marked stool retention in the upper abdomen.   Electronically Signed   By: Marnee Spring M.D.   On: 03/29/2015 15:33   Mr Brain Wo Contrast  03/23/2015   CLINICAL DATA:  24 year old female recently diagnosed with ear infection status post antibiotics but continued ataxia, unsteady gait. Initial encounter.  EXAM: MRI HEAD WITHOUT CONTRAST  TECHNIQUE: Multiplanar, multiecho pulse sequences of the brain and surrounding structures were obtained without intravenous contrast.  COMPARISON:   Imaging Brain MRI 12/31/2012  FINDINGS: Rapid scanning technique employed.  Chronic widespread hyperostosis of the skull. Stable cerebral volume. Major intracranial vascular flow voids appear stable. No restricted diffusion or evidence of acute infarction. No ventriculomegaly (stable mild colpocephaly). No midline shift or intracranial mass lesion. No acute or chronic intracranial blood products identified. No encephalomalacia identified. Wallace Cullens and white matter signal appears stable. Negative pituitary and cervicomedullary junction. Stable visualized cervical spine.  There is new right ethmoid air cell fluid/opacification. Mild bilateral maxillary sinus mucosal thickening is new. Other Visualized paranasal sinuses and mastoids are clear. No tympanic cavity fluid. Chronic diminutive appearance of the IAC.  Orbits soft tissues appear stable and within normal limits. Scalp soft tissues appear normal.  IMPRESSION: 1. Stable noncontrast MRI appearance of the brain since 2014. 2. Mild-to-moderate paranasal sinus inflammation primarily at the right ethmoids. Mastoids and tympanic cavities are clear. 3. 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 If there are multiple cranial neuropathies consider cranial nerve entrapment such as in hyperostosis cranialis interna.    Electronically Signed   By: Odessa Fleming M.D.   On: 03/23/2015 14:03     CBC  Recent Labs Lab 03/29/15 1604 03/30/15 0536 03/31/15 0504 03/31/15 1444  WBC 35.2* 29.7* 22.9* 16.4*  HGB 11.4* 9.8* 10.3* 10.4*  HCT 34.4* 29.9* 30.6* 31.6*  PLT 129* 86*  84* 87*  MCV 96.7 95.0 94.3 94.1  MCH 32.0 31.1 31.9 30.9  MCHC 33.1 32.7 33.8 32.9  RDW 14.1 13.8 13.5 13.3    Chemistries   Recent Labs Lab 03/29/15 1604 03/30/15 0536 03/30/15 1615 03/31/15 0504 03/31/15 1444 04/01/15 0512 04/01/15 0940  NA 148* 141  --  137 137 140  140  --   K 3.5 2.9* 4.1 3.5 4.0 3.7  3.7  --   CL 116* 106  --  105 105 105  106  --   CO2 28 30  --  28 29 30  29   --   GLUCOSE 101* 134*  --  121* 116* 129*  129*  --   BUN 42* 29*  --  23* 19 16  16   --   CREATININE 2.45* 1.72*  --  1.51* 1.18* 1.40*  1.48*  --   CALCIUM 11.3* 9.0  --  9.9 9.8 10.5*  10.4*  --   MG  --  2.3  --  2.1  --   --  2.1  AST 37  --   --  32  --   --   --   ALT 24  --   --  21  --   --   --   ALKPHOS 115  --   --  108  --   --   --   BILITOT 0.4  --   --  0.6  --   --   --    ------------------------------------------------------------------------------------------------------------------ estimated creatinine clearance is 51.5 mL/min (by C-G formula based on Cr of 1.48). ------------------------------------------------------------------------------------------------------------------ No results for input(s): HGBA1C in the last 72 hours. ------------------------------------------------------------------------------------------------------------------ No results for input(s): CHOL, HDL, LDLCALC, TRIG, CHOLHDL, LDLDIRECT in the last 72 hours. ------------------------------------------------------------------------------------------------------------------ No results for input(s): TSH, T4TOTAL, T3FREE, THYROIDAB in the last 72 hours.  Invalid input(s):  FREET3 ------------------------------------------------------------------------------------------------------------------ No results for input(s): VITAMINB12, FOLATE, FERRITIN, TIBC, IRON, RETICCTPCT in the last 72 hours.  Coagulation profile No results for input(s): INR, PROTIME in the last 168 hours.  No results for input(s): DDIMER in the last 72 hours.  Cardiac Enzymes No results for input(s): CKMB, TROPONINI, MYOGLOBIN in the last 168 hours.  Invalid input(s): CK ------------------------------------------------------------------------------------------------------------------ Invalid input(s): POCBNP    Assessment & Plan   24 year old female with past medical history of mental retardation, paranoid schizophrenia, hypertension, history of seizures, who presents to the hospital due to ataxic gait, altered mental status and noted to have lithium toxicity.  #1 altered mental status/encephalopathy-this is likely metabolic in nature and related to lithium toxicity. Slowly improving  #2 pauses  : Possibly related to her lithium toxicity, sent currently on dopamine which will continue. Also obtain echocardiogram of the heart, cardiology consult has ordered been done monitor closely  #3 lithium toxicity-this is likely the setting of patient's increasing dose and also probably underlying acute renal failure. Status post hemodialysis with resolution  #4ataxia-this is likely related to the patient's lithium toxicity. His PTt therapy evaluation once stable  #5 acute renal failure-this is likely in setting of dehydration and poor by mouth intake. Patient's renal function is stable creatinine possibly falsely low due to hemodialysis  #5 leukocytosis- trending down no real source of infection identified continue level Floxin for 1 more day  #6 hypertension blood pressure meds on hold for now on dopamine for low blood per  #7 history of seizures-no evidence of acute seizure type activity.  On Depakote IV due to patient not able to  take much by mouth   #8 severe hypok repalced  #9 severe constipation on xray, once awake can give meds to help with bm    Code Status Orders        Start     Ordered   03/29/15 1951  Full code   Continuous     03/29/15 1952           Consults  nephrology     Lab Results  Component Value Date   PLT 87* 03/31/2015     Time Spent in minutes   of critical care    Auburn Bilberry M.D on 04/01/2015 at 1:37 PM  Between 7am to 6pm - Pager - [phone]  After 6pm go to [email or web address] - password [email or web address] [facility]  [facility]   Office  [phone]

## 2015-04-01 NOTE — Consult Note (Signed)
Cardiology Consultation Note  Patient ID: Hannah Keller, MRN: [id number], DOB/AGE: 24-15-1992 24 y.o. Admit date: 03/29/2015   Date of Consult: 04/01/2015 Primary Physician: Domenic Schwab, FNP Primary Cardiologist: New to [facility]  Chief Complaint: Emesis, AMS Reason for Consult: Pauses  HPI: 24 y.o. female with h/o mental retardation, possible schizophrenia, developmental delay, history of seizure disorder on chronic lithium which was recently increased presented to [facility] on 8/29 with lithium toxicity, ataxic gait, emesis, acute renal failure requiring HD, and altered mental status. She ultimately had several pauses observed on telemetry on 9/1. Cardiology is consulted for further evaluation.   She has no prior previously known cardiac history. Per caretaker she had an echo done by PCP sometime ago that showed an enlarged heart. Results are not on file. She has a long history of snoring.   This morning it was noted on telemetry she had several episodes of pauses ranging from 3.1 seconds to 12.6 seconds. These were not associated with suctioning. Labs showed Mg++ 2.1, K+ 3.7, lithium 1.13 s/p HD, ABG with pH 7.46, pO2 of 60, troponin pending. Sinus this morning she has been in sinus tachycardia in the low 100s.    Past Medical History  Diagnosis Date  . Mental retardation     Mild  . Paranoid schizophrenia   . Seizures   . Developmental delay       Most Recent Cardiac Studies: Echo 04/01/2015 pending   Surgical History: History reviewed. No pertinent past surgical history.   Home Meds: Prior to Admission medications   Medication Sig Start Date End Date Taking? Authorizing Provider  acetaminophen (TYLENOL) 325 MG tablet Take 325 mg by mouth every 6 (six) hours as needed for mild pain, fever or headache.   Yes Historical Provider, MD  ARIPiprazole (ABILIFY) 15 MG tablet Take 15 mg by mouth daily at 12 noon.   Yes Historical Provider, MD  Asenapine Maleate (SAPHRIS) 10 MG SUBL Place 1  tablet under the tongue 3 (three) times daily.    Yes Historical Provider, MD  benztropine (COGENTIN) 0.5 MG tablet Take 0.5 mg by mouth at bedtime.   Yes Historical Provider, MD  cholecalciferol (VITAMIN D) 1000 UNITS tablet Take 1,000 Units by mouth 2 (two) times daily.   Yes Historical Provider, MD  clonazePAM (KLONOPIN) 0.5 MG tablet Take 0.5 mg by mouth every 6 (six) hours as needed (for agitation).   Yes Historical Provider, MD  cloNIDine (CATAPRES - DOSED IN MG/24 HR) 0.3 mg/24hr patch Place 0.3 mg onto the skin once a week. Pt applies on Friday.   Yes Historical Provider, MD  divalproex (DEPAKOTE SPRINKLE) 125 MG capsule Take 750 mg by mouth 2 (two) times daily.    Yes Historical Provider, MD  docusate sodium (COLACE) 100 MG capsule Take 200 mg by mouth daily.    Yes Historical Provider, MD  ferrous sulfate 325 (65 FE) MG tablet Take 325 mg by mouth daily.   Yes Historical Provider, MD  hydrochlorothiazide (HYDRODIURIL) 25 MG tablet Take 25 mg by mouth daily.   Yes Historical Provider, MD  lithium carbonate (LITHOBID) 300 MG CR tablet Take 600 mg by mouth 2 (two) times daily.    Yes Historical Provider, MD  meclizine (ANTIVERT) 25 MG tablet Take 25 mg by mouth 3 (three) times daily as needed for dizziness.   Yes Historical Provider, MD  medroxyPROGESTERone (DEPO-PROVERA) 150 MG/ML injection Inject 150 mg into the muscle every 3 (three) months.   Yes Historical Provider, MD  nystatin  cream (MYCOSTATIN) Apply 1 application topically 2 (two) times daily.   Yes Historical Provider, MD  Olopatadine HCl (PATADAY) 0.2 % SOLN Apply 1 drop to eye daily as needed (for allergies).    Yes Historical Provider, MD  PARoxetine (PAXIL) 20 MG tablet Take 20 mg by mouth daily.   Yes Historical Provider, MD  polyethylene glycol (MIRALAX / GLYCOLAX) packet Take 17 g by mouth every other day.   Yes Historical Provider, MD  QUEtiapine (SEROQUEL) 50 MG tablet Take 50 mg by mouth 3 (three) times daily.   Yes  Historical Provider, MD  solifenacin (VESICARE) 10 MG tablet Take 10 mg by mouth daily.   Yes Historical Provider, MD  valACYclovir (VALTREX) 1000 MG tablet Take 2,000 mg by mouth 2 (two) times daily as needed (for fever blisters).   Yes Historical Provider, MD    Inpatient Medications:  . heparin  5,000 Units Subcutaneous 3 times per day  . levofloxacin (LEVAQUIN) IV  500 mg Intravenous q1800  . nystatin cream  1 application Topical BID  . olopatadine  1 drop Both Eyes BID  . valproate sodium  750 mg Intravenous Q12H   . sodium chloride 125 mL/hr (04/01/15 0717)  . DOPamine 2 mcg/kg/min (04/01/15 0944)    Allergies:  Allergies  Allergen Reactions  . Ritalin [Methylphenidate Hcl] Other (See Comments)    Reaction:  Unknown     Social History   Social History  . Marital Status: Single    Spouse Name: N/A  . Number of Children: 0  . Years of Education: n/a   Occupational History  . Disabled    Social History Main Topics  . Smoking status: Never Smoker   . Smokeless tobacco: Never Used  . Alcohol Use: No  . Drug Use: No  . Sexual Activity: Not on file   Other Topics Concern  . Not on file   Social History Narrative   Pt currently resides at [facility].   Caffeine Use: None     No family history on file.   Review of Systems: Review of Systems  Unable to perform ROS: mental acuity     Labs: No results for input(s): CKTOTAL, CKMB, TROPONINI in the last 72 hours. Lab Results  Component Value Date   WBC 16.4* 03/31/2015   HGB 10.4* 03/31/2015   HCT 31.6* 03/31/2015   MCV 94.1 03/31/2015   PLT 87* 03/31/2015    Recent Labs Lab 03/31/15 0504  04/01/15 0512  NA 137  < > 140  140  K 3.5  < > 3.7  3.7  CL 105  < > 105  106  CO2 28  < > 30  29  BUN 23*  < > 16  16  CREATININE 1.51*  < > 1.40*  1.48*  CALCIUM 9.9  < > 10.5*  10.4*  PROT 5.5*  --   --   BILITOT 0.6  --   --   ALKPHOS 108  --   --   ALT 21  --   --   AST 32  --   --     GLUCOSE 121*  < > 129*  129*  < > = values in this interval not displayed. No results found for: CHOL, HDL, LDLCALC, TRIG No results found for: DDIMER  Radiology/Studies:  Dg Chest 2 View  03/29/2015   CLINICAL DATA:  Congestion after vomiting.  EXAM: CHEST  2 VIEW  COMPARISON:  None.  FINDINGS: Marked hypoventilation, limiting  sensitivity. No gross asymmetry in aeration. No effusion or air leak. Normal heart size and mediastinal contours.  Stool dilated colon visualized in the upper abdomen.  IMPRESSION: 1. Very limited low volume chest, cannot exclude aspiration pneumonitis. 2. Marked stool retention in the upper abdomen.   Electronically Signed   By: Marnee Spring M.D.   On: 03/29/2015 15:33   Mr Brain Wo Contrast  03/23/2015   CLINICAL DATA:  24 year old female recently diagnosed with ear infection status post antibiotics but continued ataxia, unsteady gait. Initial encounter.  EXAM: MRI HEAD WITHOUT CONTRAST  TECHNIQUE: Multiplanar, multiecho pulse sequences of the brain and surrounding structures were obtained without intravenous contrast.  COMPARISON:  [facility] Brain MRI 12/31/2012  FINDINGS: Rapid scanning technique employed.  Chronic widespread hyperostosis of the skull. Stable cerebral volume. Major intracranial vascular flow voids appear stable. No restricted diffusion or evidence of acute infarction. No ventriculomegaly (stable mild colpocephaly). No midline shift or intracranial mass lesion. No acute or chronic intracranial blood products identified. No encephalomalacia identified. Wallace Cullens and white matter signal appears stable. Negative pituitary and cervicomedullary junction. Stable visualized cervical spine.  There is new right ethmoid air cell fluid/opacification. Mild bilateral maxillary sinus mucosal thickening is new. Other Visualized paranasal sinuses and mastoids are clear. No tympanic cavity fluid. Chronic diminutive appearance of the IAC.  Orbits soft tissues appear  stable and within normal limits. Scalp soft tissues appear normal.  IMPRESSION: 1. Stable noncontrast MRI appearance of the brain since 2014. 2. Mild-to-moderate paranasal sinus inflammation primarily at the right ethmoids. Mastoids and tympanic cavities are clear. 3. 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 If there are multiple cranial neuropathies consider cranial nerve entrapment such as in hyperostosis cranialis interna.   Electronically Signed   By: Odessa Fleming M.D.   On: 03/23/2015 14:03    EKG: ECG 8/29: NSR 86 bpm, TWI V2-V3, V5, prolonged QT 461; ECG 8/31: NSR with marked sinus arrhythmia, 69 bpm, TWI V2-V3; ECG 9/1: NSR with sinus arrhythmia, 95 bpm, TWI V2-V3  Telemetry: Currently in sinus tachycardia, low 100s, episodes earlier with several pauses ranging from max of 12.6 sec to 3.1 sec   Weights: Filed Weights   03/29/15 1454 03/31/15 1415  Weight: 170 lb (77.111 kg) 156 lb 4.9 oz (70.9 kg)     Physical Exam: Blood pressure 111/76, pulse 78, temperature 97.9 F (36.6 C), temperature source Axillary, resp. rate 24, height 5' (1.524 m), weight 156 lb 4.9 oz (70.9 kg), SpO2 96 %. Body mass index is 30.53 kg/(m^2). General: Well developed, well nourished, in no acute distress. Head: Normocephalic, atraumatic, sclera non-icteric, no xanthomas, nares are without discharge.  Neck: Negative for carotid bruits. JVD not elevated. Lungs: Clear bilaterally to auscultation without wheezes, rales, or rhonchi. Breathing is unlabored. Heart: RRR with S1 S2. No murmurs, rubs, or gallops appreciated. Abdomen: Soft, non-tender, non-distended with normoactive bowel sounds. No hepatomegaly. No rebound/guarding. No obvious abdominal masses. Msk:  Strength and tone appear normal for age. Extremities: No clubbing or cyanosis. No edema.  Distal pedal pulses are 2+ and equal bilaterally. Neuro: Alert. No facial asymmetry. No focal deficit. Moves all extremities  spontaneously. Psych:  Sedated.    Assessment and Plan:   1. Pauses: -Possibly secondary to her lithium toxicity vs sleep apnea as she snores at baseline  -Asymptomatic  -Lytes ok -Check TSH -Check echo to evaluate LV function, wall motion, and right side pressure -Concern for possible need of PPM, this is certainly a possibility  given her pauses though would would to exclude outside etiology given her age   2. AMS/encephalopathy: -In the setting of lithium toxicity  -Appears to be improving when reading through her hospitalization  3. Lithium toxicity: -Improved s/p HD  4. Psychosocial concerns: -Psych on board   5. ARF: -HD per Renal  6. Leukocytosis: -Possibly inflammation  7. Thrombocytopenia: -Hold heparin     Elinor Dodge, PA-C Pager: [phone] 04/01/2015, 11:37 AM

## 2015-04-01 NOTE — Progress Notes (Signed)
*  PRELIMINARY RESULTS* Echocardiogram 2D Echocardiogram has been performed.  Georgann Housekeeper Hege 04/01/2015, 1:47 PM

## 2015-04-01 NOTE — Consult Note (Signed)
[facility] Consult   Reason for Consult:  Consult and follow-up for this 24 year old woman with mental retardation and chronic behavior problems who presented to the hospital with lithium toxicity Referring Physician:  Verdell Carmine Patient Identification: Hannah Keller MRN:  [id number] Principal Diagnosis: Lithium toxicity Diagnosis:   Patient Active Problem List   Diagnosis Date Noted  . Lithium toxicity [T56.891A] 03/29/2015  . 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 female patient admitted with patient is not able to offer any history at this time. Unclear how uncomfortable she might be.Marland Kitchen  HPI:  Follow-up on yesterday's note. This 24 year old woman was brought in by the staff at her family care home. They described what sounded like a week or 2 of worsening mental state with less verbal communication than normal and gradually increasing trouble walking to the point where she was not able to ambulate at all. There had not been any known changes to her medication in the last few weeks. A little over a month ago apparently she did have an increase in her prescribed lithium dose by 33% total. Also had recently started a modest dose of topiramate.  Patient has chronic intellectual disability. Probably in the moderate range. Not entirely clear to me from how the caretakers describe it but apparently she is able to talk somewhat and do some self-care at least when she is at her baseline. She's been on multiple medications suggesting diagnosis of other mental health conditions. Probably chronic behavior problems. Diagnosis of schizophrenia is offered here. Not entirely sure as we don't have a lot of other past psychiatric detail.  Medical history: Patient presented with a lithium level of 4.4 and an elevated creatinine of 2. She does have a apparent history of probably gastric reflux  possibly some form of herpes.  Social history: Patient has a legal guardian with the Department of Social Services. No known family involved. Has lived in the same family care home for many years and they are quite familiar with her.  Substance abuse history: None  Follow-up as of Thursday, September 1. Valproic acid level is in the low 100s. Ammonia level came back as 40 which is just slightly elevated. Given that she's been having dialysis and probably a fair bit of hemolysis I would probably not worry too much about that. On exam today the patient is much better mentally. She is able to speak and answer some questions articulately. She still is only speaking partially and appears confused. The group home owner is fair however and says the patient is getting back much closer to her baseline. She still not been able to stand. Lithium level is obviously coming down with dialysis. HPI Elements:   Quality:  Confusion and ataxia delirium. Severity:  Severe life-threatening. Timing:  Gradually progressive over at least a week. Duration:  Ongoing. Context:  Some increased lithium level or dose.  Past Medical History:  Past Medical History  Diagnosis Date  . Mental retardation     Mild  . Paranoid schizophrenia   . Seizures   . Developmental delay    History reviewed. No pertinent past surgical history. Family History: No family history on file. Social History:  History  Alcohol Use No     History  Drug Use No    Social History   Social History  . Marital Status: Single    Spouse Name: N/A  .  Number of Children: 0  . Years of Education: n/a   Occupational History  . Disabled    Social History Main Topics  . Smoking status: Never Smoker   . Smokeless tobacco: Never Used  . Alcohol Use: No  . Drug Use: No  . Sexual Activity: Not Asked   Other Topics Concern  . None   Social History Narrative   Pt currently resides at [facility].   Caffeine Use: None   Additional  Social History:                          Allergies:   Allergies  Allergen Reactions  . Ritalin [Methylphenidate Hcl] Other (See Comments)    Reaction:  Unknown     Labs:  Results for orders placed or performed during the hospital encounter of 03/29/15 (from the past 48 hour(s))  Lithium level     Status: Abnormal   Collection Time: 03/30/15 11:58 PM  Result Value Ref Range   Lithium Lvl 1.80 (HH) 0.60 - 1.20 mmol/L    Comment: CRITICAL RESULT CALLED TO, READ BACK BY AND VERIFIED WITH BRYAN HADDOCK 03/31/2015 0051 [facility]  Lithium level     Status: Abnormal   Collection Time: 03/31/15  1:47 AM  Result Value Ref Range   Lithium Lvl 1.90 (HH) 0.60 - 1.20 mmol/L    Comment: CRITICAL RESULT CALLED TO, READ BACK BY AND VERIFIED WITH KATHRYN CLAYTON AT 1240 03/31/15 DAS   Magnesium     Status: None   Collection Time: 03/31/15  5:04 AM  Result Value Ref Range   Magnesium 2.1 1.7 - 2.4 mg/dL  Phosphorus     Status: None   Collection Time: 03/31/15  5:04 AM  Result Value Ref Range   Phosphorus 3.2 2.5 - 4.6 mg/dL  CBC     Status: Abnormal   Collection Time: 03/31/15  5:04 AM  Result Value Ref Range   WBC 22.9 (H) 3.6 - 11.0 K/uL   RBC 3.24 (L) 3.80 - 5.20 MIL/uL   Hemoglobin 10.3 (L) 12.0 - 16.0 g/dL   HCT 30.6 (L) 35.0 - 47.0 %   MCV 94.3 80.0 - 100.0 fL   MCH 31.9 26.0 - 34.0 pg   MCHC 33.8 32.0 - 36.0 g/dL   RDW 13.5 11.5 - 14.5 %   Platelets 84 (L) 150 - 440 K/uL  Comprehensive metabolic panel     Status: Abnormal   Collection Time: 03/31/15  5:04 AM  Result Value Ref Range   Sodium 137 135 - 145 mmol/L   Potassium 3.5 3.5 - 5.1 mmol/L   Chloride 105 101 - 111 mmol/L   CO2 28 22 - 32 mmol/L   Glucose, Bld 121 (H) 65 - 99 mg/dL   BUN 23 (H) 6 - 20 mg/dL   Creatinine, Ser 1.51 (H) 0.44 - 1.00 mg/dL   Calcium 9.9 8.9 - 10.3 mg/dL   Total Protein 5.5 (L) 6.5 - 8.1 g/dL   Albumin 2.5 (L) 3.5 - 5.0 g/dL   AST 32 15 - 41 U/L   ALT 21 14 - 54 U/L   Alkaline  Phosphatase 108 38 - 126 U/L   Total Bilirubin 0.6 0.3 - 1.2 mg/dL   GFR calc non Af Amer 47 (L) >60 mL/min   GFR calc Af Amer 55 (L) >60 mL/min    Comment: (NOTE) The eGFR has been calculated using the CKD EPI equation. This calculation has not  been validated in all clinical situations. eGFR's persistently <60 mL/min signify possible Chronic Kidney Disease.    Anion gap 4 (L) 5 - 15  Renal function panel     Status: Abnormal   Collection Time: 03/31/15  2:44 PM  Result Value Ref Range   Sodium 137 135 - 145 mmol/L   Potassium 4.0 3.5 - 5.1 mmol/L   Chloride 105 101 - 111 mmol/L   CO2 29 22 - 32 mmol/L   Glucose, Bld 116 (H) 65 - 99 mg/dL   BUN 19 6 - 20 mg/dL   Creatinine, Ser 1.18 (H) 0.44 - 1.00 mg/dL   Calcium 9.8 8.9 - 10.3 mg/dL   Phosphorus 2.5 2.5 - 4.6 mg/dL   Albumin 2.6 (L) 3.5 - 5.0 g/dL   GFR calc non Af Amer >60 >60 mL/min   GFR calc Af Amer >60 >60 mL/min    Comment: (NOTE) The eGFR has been calculated using the CKD EPI equation. This calculation has not been validated in all clinical situations. eGFR's persistently <60 mL/min signify possible Chronic Kidney Disease.    Anion gap 3 (L) 5 - 15  CBC     Status: Abnormal   Collection Time: 03/31/15  2:44 PM  Result Value Ref Range   WBC 16.4 (H) 3.6 - 11.0 K/uL   RBC 3.36 (L) 3.80 - 5.20 MIL/uL   Hemoglobin 10.4 (L) 12.0 - 16.0 g/dL   HCT 31.6 (L) 35.0 - 47.0 %   MCV 94.1 80.0 - 100.0 fL   MCH 30.9 26.0 - 34.0 pg   MCHC 32.9 32.0 - 36.0 g/dL   RDW 13.3 11.5 - 14.5 %   Platelets 87 (L) 150 - 440 K/uL  Ammonia     Status: Abnormal   Collection Time: 03/31/15 11:55 PM  Result Value Ref Range   Ammonia 40 (H) 9 - 35 umol/L    Comment: HEMOLYSIS AT THIS LEVEL MAY AFFECT RESULT  Basic metabolic panel     Status: Abnormal   Collection Time: 04/01/15  5:12 AM  Result Value Ref Range   Sodium 140 135 - 145 mmol/L   Potassium 3.7 3.5 - 5.1 mmol/L   Chloride 106 101 - 111 mmol/L   CO2 29 22 - 32 mmol/L    Glucose, Bld 129 (H) 65 - 99 mg/dL   BUN 16 6 - 20 mg/dL   Creatinine, Ser 1.48 (H) 0.44 - 1.00 mg/dL   Calcium 10.4 (H) 8.9 - 10.3 mg/dL   GFR calc non Af Amer 49 (L) >60 mL/min   GFR calc Af Amer 56 (L) >60 mL/min    Comment: (NOTE) The eGFR has been calculated using the CKD EPI equation. This calculation has not been validated in all clinical situations. eGFR's persistently <60 mL/min signify possible Chronic Kidney Disease.    Anion gap 5 5 - 15  Lithium level     Status: None   Collection Time: 04/01/15  5:12 AM  Result Value Ref Range   Lithium Lvl 1.13 0.60 - 1.20 mmol/L  Renal function panel     Status: Abnormal   Collection Time: 04/01/15  5:12 AM  Result Value Ref Range   Sodium 140 135 - 145 mmol/L   Potassium 3.7 3.5 - 5.1 mmol/L   Chloride 105 101 - 111 mmol/L   CO2 30 22 - 32 mmol/L   Glucose, Bld 129 (H) 65 - 99 mg/dL   BUN 16 6 - 20 mg/dL   Creatinine, Ser 1.40 (  H) 0.44 - 1.00 mg/dL   Calcium 10.5 (H) 8.9 - 10.3 mg/dL   Phosphorus 3.3 2.5 - 4.6 mg/dL   Albumin 2.3 (L) 3.5 - 5.0 g/dL   GFR calc non Af Amer 52 (L) >60 mL/min   GFR calc Af Amer >60 >60 mL/min    Comment: (NOTE) The eGFR has been calculated using the CKD EPI equation. This calculation has not been validated in all clinical situations. eGFR's persistently <60 mL/min signify possible Chronic Kidney Disease.    Anion gap 5 5 - 15  Blood gas, arterial     Status: Abnormal   Collection Time: 04/01/15  9:05 AM  Result Value Ref Range   FIO2 0.21    pH, Arterial 7.46 (H) 7.350 - 7.450   pCO2 arterial 42 32.0 - 48.0 mmHg   pO2, Arterial 60 (L) 83.0 - 108.0 mmHg   Bicarbonate 29.9 (H) 21.0 - 28.0 mEq/L   Acid-Base Excess 5.4 (H) 0.0 - 3.0 mmol/L   O2 Saturation 92.0 %   Patient temperature 37.0    Collection site RIGHT BRACHIAL    Sample type ARTERIAL DRAW    Allens test (pass/fail) POSITIVE (A) PASS  Magnesium     Status: None   Collection Time: 04/01/15  9:40 AM  Result Value Ref Range    Magnesium 2.1 1.7 - 2.4 mg/dL  Troponin I     Status: None   Collection Time: 04/01/15  9:40 AM  Result Value Ref Range   Troponin I <0.03 <0.031 ng/mL    Comment:        NO INDICATION OF MYOCARDIAL INJURY.     Vitals: Blood pressure 104/76, pulse 97, temperature 98.7 F (37.1 C), temperature source Axillary, resp. rate 20, height 5' (1.524 m), weight 70.9 kg (156 lb 4.9 oz), SpO2 91 %.  Risk to Self: Is patient at risk for suicide?: No Risk to Others:   Prior Inpatient Therapy:   Prior Outpatient Therapy:    Current Facility-Administered Medications  Medication Dose Route Frequency Provider Last Rate Last Dose  . 0.45 % sodium chloride infusion   Intravenous Continuous Munsoor Lateef, MD 125 mL/hr at 04/01/15 1900    . 0.9 %  sodium chloride infusion  100 mL Intravenous PRN Munsoor Lateef, MD      . 0.9 %  sodium chloride infusion  100 mL Intravenous PRN Munsoor Lateef, MD      . alteplase (CATHFLO ACTIVASE) injection 2 mg  2 mg Intracatheter Once PRN Munsoor Lateef, MD      . DOPamine (INTROPIN) 800 mg in dextrose 5 % 250 mL (3.2 mg/mL) infusion  2-20 mcg/kg/min Intravenous Continuous Wilhelmina Mcardle, MD   Stopped at 04/01/15 1300  . feeding supplement (NEPRO CARB STEADY) liquid 237 mL  237 mL Oral PRN Munsoor Lateef, MD      . heparin injection 1,000 Units  1,000 Units Dialysis PRN Munsoor Lateef, MD   3,600 Units at 03/31/15 1636  . heparin injection 5,000 Units  5,000 Units Subcutaneous 3 times per day Henreitta Leber, MD   5,000 Units at 04/01/15 1450  . levofloxacin (LEVAQUIN) IVPB 500 mg  500 mg Intravenous q1800 Dustin Flock, MD   500 mg at 04/01/15 1823  . lidocaine (PF) (XYLOCAINE) 1 % injection 5 mL  5 mL Intradermal PRN Munsoor Lateef, MD      . lidocaine-prilocaine (EMLA) cream 1 application  1 application Topical PRN Munsoor Lateef, MD      . LORazepam (ATIVAN)  injection 2 mg  2 mg Intravenous Q4H PRN Lytle Butte, MD   2 mg at 03/31/15 1533  . nystatin cream  (MYCOSTATIN) 1 application  1 application Topical BID Henreitta Leber, MD   1 application at 91/50/56 [phone]  . olopatadine (PATANOL) 0.1 % ophthalmic solution 1 drop  1 drop Both Eyes BID Henreitta Leber, MD   1 drop at 04/01/15 0943  . pentafluoroprop-tetrafluoroeth (GEBAUERS) aerosol 1 application  1 application Topical PRN Munsoor Lateef, MD      . valproate (DEPACON) 750 mg in dextrose 5 % 50 mL IVPB  750 mg Intravenous Q12H Dustin Flock, MD   750 mg at 04/01/15 0944    Musculoskeletal: Strength & Muscle Tone: decreased Gait & Station: unable to stand Patient leans: N/A  Psychiatric Specialty Exam: Physical Exam  Nursing note and vitals reviewed. Constitutional: She appears well-developed and well-nourished. She appears lethargic. She is uncooperative. She appears toxic.  HENT:  Head: Normocephalic and atraumatic.    Eyes: Conjunctivae are normal. Pupils are equal, round, and reactive to light.  Neck: Normal range of motion.  Cardiovascular: Normal heart sounds.   Respiratory: Effort normal.  GI: Soft.  Musculoskeletal: Normal range of motion.  Neurological: She appears lethargic. She exhibits abnormal muscle tone. Coordination abnormal.  Skin: Skin is warm and dry.  Psychiatric: Her affect is not labile. Her speech is slurred. She is slowed. She is not agitated. Cognition and memory are impaired. She does not express impulsivity. She is communicative.  Patient is responding much better today. She is able to speak several words in a row and answer questions lucidly at times. Interacting well with the group home owner.    Review of Systems  Unable to perform ROS Constitutional: Negative.   HENT: Negative.   Eyes: Negative.   Respiratory: Negative.   Cardiovascular: Negative.   Gastrointestinal: Positive for abdominal pain.  Musculoskeletal: Negative.   Skin: Negative.   Neurological: Negative.   Psychiatric/Behavioral: Negative for depression, suicidal ideas,  hallucinations and substance abuse. The patient is nervous/anxious. The patient does not have insomnia.     Blood pressure 104/76, pulse 97, temperature 98.7 F (37.1 C), temperature source Axillary, resp. rate 20, height 5' (1.524 m), weight 70.9 kg (156 lb 4.9 oz), SpO2 91 %.Body mass index is 30.53 kg/(m^2).  General Appearance: Fairly Groomed  Engineer, water::  Minimal  Speech:  Garbled  Volume:  Decreased  Mood:  Not stated  Affect:  Flat  Thought Process:  Not able to really identify  Orientation:  Negative  Thought Content:  Negative  Suicidal Thoughts:  No  Homicidal Thoughts:  No  Memory:  Negative  Judgement:  Impaired  Insight:  Lacking  Psychomotor Activity:  Decreased  Concentration:  Poor  Recall:  Poor  Fund of Knowledge:Poor  Language: Poor  Akathisia:  No  Handed:  Right  AIMS (if indicated):     Assets:  Housing Social Support  ADL's:  Impaired  Cognition: Impaired,  Severe  Sleep:      Medical Decision Making: New problem, with additional work up planned, Review or order clinical lab tests (1), Review or order medicine tests (1) and Review of Medication Regimen & Side Effects (2)  Treatment Plan Summary: Plan Resolving delirium. Hopefully she will return to her baseline. Spoke with the group home owner a little more today. She told me very clearly that these problems seemed to start when the patient was put on hydrochlorothiazide. We have been  wondering what could've made her blood level go so high suddenly and even that low dose of hydrochlorothiazide may have been an important part of it. Patient has been on lithium for many years as an important part of mood stabilization. I think that once her level is consistently down to 1 or below that it would be reasonable to try restarting lithium although it at a very low dose such as 300 a day only. I will continue to follow.  Plan:  Continue monitoring. Disposition: No need for psychiatric hospitalization  Alethia Berthold 04/01/2015 7:36 PM

## 2015-04-01 NOTE — Progress Notes (Signed)
Pt with several episodes of pauses throughout the morning, strips printed and ECG performed. Md notified and cardiology C/S'd. Pt placed on Dopamine, per MD order. Cardiology assessed at bedside and decided to d/c dopamine at this time. Pt with no additional pauses since 0800. Pts other VS are WNL. Pts mental status is returning to baseline per family at the bedside. Pt with foley and of urine output throughout. 1 BM noted and pt remains NPO, plan for speech eval tomorrow. Will continue to monitor.

## 2015-04-01 NOTE — Progress Notes (Signed)
PHARMACY - CRITICAL CARE PROGRESS NOTE  Pharmacy Consult for Electrolyte Supplementation    Allergies  Allergen Reactions  . Ritalin [Methylphenidate Hcl] Other (See Comments)    Reaction:  Unknown     Patient Measurements: Height: 5' (152.4 cm) Weight: 156 lb 4.9 oz (70.9 kg) IBW/kg (Calculated) : 45.5   Vital Signs: Temp: 97.9 F (36.6 C) (09/01 0800) Temp Source: Axillary (09/01 0800) BP: 111/76 mmHg (09/01 1000) Pulse Rate: 78 (09/01 1000) Intake/Output from previous day: 08/31 0701 - 09/01 0700 In: 3550 [I.V.:3000; IV Piggyback:550] Out: 2026 [Urine:2025; Stool:1] Intake/Output from this shift: Total I/O In: -  Out: 400 [Urine:400]     Labs:  Recent Labs  03/29/15 1604  03/30/15 0536 03/31/15 0504 03/31/15 1444 04/01/15 0512 04/01/15 0940  WBC 35.2*  --  29.7* 22.9* 16.4*  --   --   HGB 11.4*  --  9.8* 10.3* 10.4*  --   --   HCT 34.4*  --  29.9* 30.6* 31.6*  --   --   PLT 129*  --  86* 84* 87*  --   --   CREATININE 2.45*  --  1.72* 1.51* 1.18* 1.40*  1.48*  --   MG  --   --  2.3 2.1  --   --  2.1  PHOS  --   < > 3.6 3.2 2.5 3.3  --   ALBUMIN 3.3*  --   --  2.5* 2.6* 2.3*  --   PROT 7.0  --   --  5.5*  --   --   --   AST 37  --   --  32  --   --   --   ALT 24  --   --  21  --   --   --   ALKPHOS 115  --   --  108  --   --   --   BILITOT 0.4  --   --  0.6  --   --   --   < > = values in this interval not displayed. Estimated Creatinine Clearance: 51.5 mL/min (by C-G formula based on Cr of 1.48).   Recent Labs  03/29/15 2018  GLUCAP 76   BMP Latest Ref Rng 04/01/2015 04/01/2015 03/31/2015  Glucose 65 - 99 mg/dL 161(W) 960(A) 540(J)  BUN 6 - 20 mg/dL Creatinine 0.44 - 1.00 mg/dL 8.11(B) 1.47(W) 2.95(A)  Sodium 135 - 145 mmol/L 140 140 137  Potassium 3.5 - 5.1 mmol/L 3.7 3.7 4.0  Chloride 101 - 111 mmol/L 105 106 105  CO2 22 - 32 mmol/L Calcium 8.9 - 10.3 mg/dL 10.5(H) 10.4(H) 9.8    Medications:  Scheduled:  . heparin   5,000 Units Subcutaneous 3 times per day  . levofloxacin (LEVAQUIN) IV  500 mg Intravenous q1800  . nystatin cream  1 application Topical BID  . olopatadine  1 drop Both Eyes BID  . valproate sodium  750 mg Intravenous Q12H   Infusions:  . sodium chloride 125 mL/hr (04/01/15 0717)  . DOPamine 2 mcg/kg/min (04/01/15 2130)    Assessment: Pharmacy consulted to correct electrolyte abnormalities in a 24 yo female admitted for lithium toxicity.  Patient underwent hemodialysis earlier this AM per poison control recommendations.  SCr improving but will need to follow closely.   Goal of Therapy:  Electrolytes within normal limits  Plan:  Electrolytes are wnl. Will recheck labs in AM.   Pharmacy will  continue to follow.  Luisa Hart D 04/01/2015,1:45 PM

## 2015-04-02 DIAGNOSIS — I455 Other specified heart block: Secondary | ICD-10-CM | POA: Insufficient documentation

## 2015-04-02 DIAGNOSIS — N179 Acute kidney failure, unspecified: Secondary | ICD-10-CM | POA: Insufficient documentation

## 2015-04-02 DIAGNOSIS — I441 Atrioventricular block, second degree: Secondary | ICD-10-CM

## 2015-04-02 LAB — BASIC METABOLIC PANEL
Anion gap: 4 — ABNORMAL LOW (ref 5–15)
BUN: 19 mg/dL (ref 6–20)
CHLORIDE: 111 mmol/L (ref 101–111)
CO2: 29 mmol/L (ref 22–32)
CREATININE: 1.41 mg/dL — AB (ref 0.44–1.00)
Calcium: 9.8 mg/dL (ref 8.9–10.3)
GFR calc non Af Amer: 52 mL/min — ABNORMAL LOW (ref >60)
GFR, EST AFRICAN AMERICAN: 60 mL/min — AB (ref >60)
Glucose, Bld: 134 mg/dL — ABNORMAL HIGH (ref 65–99)
POTASSIUM: 3.6 mmol/L (ref 3.5–5.1)
Sodium: 144 mmol/L (ref 135–145)

## 2015-04-02 LAB — CBC
HEMATOCRIT: 26.8 % — AB (ref 35.0–47.0)
HEMOGLOBIN: 8.8 g/dL — AB (ref 12.0–15.0)
MCH: 31.5 pg (ref 26.0–34.0)
MCHC: 32.9 g/dL (ref 32.0–36.0)
MCV: 95.6 fL (ref 80.0–100.0)
Platelets: 98 10*3/uL — ABNORMAL LOW (ref 150–440)
RBC: 2.81 MIL/uL — AB (ref 3.80–5.20)
RDW: 13.8 % (ref 11.5–14.5)
WBC: 20.3 10*3/uL — ABNORMAL HIGH (ref 3.6–11.0)

## 2015-04-02 LAB — TROPONIN I: Troponin I: <0.03 ng/mL (ref <0.031)

## 2015-04-02 LAB — MAGNESIUM: Magnesium: 2.1 mg/dL (ref 1.7–2.4)

## 2015-04-02 LAB — LITHIUM LEVEL: LITHIUM LVL: 0.8 mmol/L (ref 0.60–1.20)

## 2015-04-02 LAB — TSH: TSH: 2.094 u[IU]/mL (ref 0.350–4.500)

## 2015-04-02 LAB — PHOSPHORUS: PHOSPHORUS: 3.7 mg/dL (ref 2.5–4.6)

## 2015-04-02 MED ORDER — LITHIUM CARBONATE 300 MG PO CAPS
300.0000 mg | ORAL_CAPSULE | Freq: Every day | ORAL | Status: DC
  Administered 2015-04-03 – 2015-04-05 (×3): 300 mg via ORAL
  Filled 2015-04-02 (×3): qty 1

## 2015-04-02 NOTE — Evaluation (Signed)
Clinical/Bedside Swallow Evaluation Patient Details  Name: Hannah Keller MRN: [id number] Date of Birth: 1990/12/19  Today's Date: 04/02/2015 Time: SLP Start Time (ACUTE ONLY): 0945 SLP Stop Time (ACUTE ONLY): 1045 SLP Time Calculation (min) (ACUTE ONLY): 60 min  Past Medical History:  Past Medical History  Diagnosis Date  . Mental retardation     Mild  . Paranoid schizophrenia   . Seizures   . Developmental delay    Past Surgical History: History reviewed. No pertinent past surgical history. HPI:  Pt is a 24 y.o. female with a known history of mental retardation, schizophrenia, developmental delay, history of seizures, who presents to the hospital due to ataxic gait and altered mental status. Patient apparently was seen at the hospital a few days back for a swallow evaluation and was noted to have esophageal dysmotility. Patient has been eating and drinking well but has also been having some vomitus of partially digested food as per the caretaker. Patient resides at a group home and therefore most of the history obtained from the caretaker bedside. As per the caretaker patient at baseline is able to walk without any ataxic gait and communicate with the well but over the past week she has been more lethargic and confused and has had imbalance issues. She has been referred to see a neurologist as an outpatient but has not been seen by anyone yet. Since his symptoms were not improving she came to the ER for further evaluation and was noted to have a significantly elevated lithium level at 4. As per the caretaker patient's lithium level was increased from 600 mg in the morning and 1200 mg at bedtime to 1200 mg twice daily. This happened about a month ago and patient has not had any follow-up lithium level since her dose was changed. During the MBSS on 8.22.16, pt presented w/ moderate-severe Esophageal dysmotility impacting the pharyngeal phase of swallowing and increasing airway compromise. During po  trials, noted retrograde activity of bolus material proximal to the level of the UES then into the pharynx and the pyriform sinuses as po trials continued. By the end of the study, bolus material in the Esophagus filled the entire(viewable) Esophagus from just below the UES to the mid/ower Esophagus indicating poor emptying/motility - this presentation can increase risk for aspiration of Reflux material from retrograde activity. Pt has been NPO since admission sec. to lethargy and increased risk for aspiration; also noted moderate constipation issues per CXR - GI dysmotility(which can impact overall toleration of swallowing/oral diet).    Assessment / Plan / Recommendation Clinical Impression  Pt appears to adequately tolerate trials of thin liquids and purees w/ strict aspiration precautions and feeding assistance. Pt exhibited min. decreased awareness(oral) overall during task of taking po's and was impulsive in her eat/drinking behaviors requiring moderate monitoring during drinking from a cup in order to limit bolus amount and to drink slowly. Pt's swallowing is quick w/ min. decreased oral attention for adequately clearing mouth/timely oral management of solids(suspected). Solids were not assessed today d/t medical status including her Esophageal dysmotility(MD aware and is ordering GI consult). Pt is at min. increased risk for aspiration at this time and would benefit from strict, 100% supervision during any oral intake for following aspiration precautions. Rec. meds in Puree - crushed as able; Reflux precautions; feeding assistance.      Aspiration Risk  Mild    Diet Recommendation Dysphagia 1 (Puree);Thin   Medication Administration: Crushed with puree (as able) Compensations: Minimize environmental distractions;Slow rate;Small sips/bites;Check  for pocketing;Multiple dry swallows after each bite/sip    Other  Recommendations Recommended Consults: Consider GI evaluation;Consider esophageal  assessment (Dietician) Oral Care Recommendations: Oral care BID;Oral care before and after PO;Staff/trained caregiver to provide oral care   Follow Up Recommendations       Frequency and Duration min 3x week  1 week   Pertinent Vitals/Pain denied    SLP Swallow Goals  see care plan    Swallow Study Prior Functional Status   resides at group home; requires monitoring during meals d/t impulsive behaviors; has been experiencing vomiting per group home report. Requires some assistance w/ ADLs.     General Date of Onset: 03/29/15 Other Pertinent Information: Pt is a 24 y.o. female with a known history of mental retardation, schizophrenia, developmental delay, history of seizures, who presents to the hospital due to ataxic gait and altered mental status. Patient apparently was seen at the hospital a few days back for a swallow evaluation and was noted to have esophageal dysmotility. Patient has been eating and drinking well but has also been having some vomitus of partially digested food as per the caretaker. Patient resides at a group home and therefore most of the history obtained from the caretaker bedside. As per the caretaker patient at baseline is able to walk without any ataxic gait and communicate with the well but over the past week she has been more lethargic and confused and has had imbalance issues. She has been referred to see a neurologist as an outpatient but has not been seen by anyone yet. Since his symptoms were not improving she came to the ER for further evaluation and was noted to have a significantly elevated lithium level at 4. As per the caretaker patient's lithium level was increased from 600 mg in the morning and 1200 mg at bedtime to 1200 mg twice daily. This happened about a month ago and patient has not had any follow-up lithium level since her dose was changed. During the MBSS on 8.22.16, pt presented w/ moderate-severe Esophageal dysmotility impacting the pharyngeal phase  of swallowing and increasing airway compromise. During po trials, noted retrograde activity of bolus material proximal to the level of the UES then into the pharynx and the pyriform sinuses as po trials continued. By the end of the study, bolus material in the Esophagus filled the entire(viewable) Esophagus from just below the UES to the mid/ower Esophagus indicating poor emptying/motility - this presentation can increase risk for aspiration of Reflux material from retrograde activity. Pt has been NPO since admission sec. to lethargy and increased risk for aspiration; also noted moderate constipation issues per CXR - GI dysmotility(which can impact overall toleration of swallowing/oral diet).  Type of Study: Bedside swallow evaluation Previous Swallow Assessment: MBSS on 8.22.16 - Outpt Diet Prior to this Study: Dysphagia 3 (soft);Thin liquids (rec'd since MBSS on 8.22.16) Temperature Spikes Noted: No (wbc 20.3) Respiratory Status: Supplemental O2 delivered via (comment) (2 liters) History of Recent Intubation: No Behavior/Cognition: Alert;Pleasant mood;Distractible;Requires cueing (cooperative w/ instruction/cues) Oral Cavity - Dentition: Missing dentition (some; front, upper teeth protrude slightly) Self-Feeding Abilities: Needs assist;Needs set up;Total assist (impulsive) Patient Positioning: Upright in bed Baseline Vocal Quality: Normal (rushed speech; min. poor articulation) Volitional Cough: Cognitively unable to elicit Volitional Swallow: Unable to elicit    Oral/Motor/Sensory Function Overall Oral Motor/Sensory Function:  (unable to fully assess sec. to pt's baseline Cognition) Labial ROM:  (appears wfl) Labial Symmetry:  (appears wfl) Lingual ROM:  (appears wfl) Lingual Symmetry:  (appears  wfl) Facial Symmetry: Within Functional Limits Mandible: Within Functional Limits   Ice Chips Ice chips: Within functional limits Presentation: Spoon (fed; 3 trials) Oral Phase Impairments: Reduced  labial seal Pharyngeal Phase Impairments:  (none)   Thin Liquid Thin Liquid: Impaired Presentation: Cup;Self Fed (assisted; ~4 ozs total ) Oral Phase Impairments: Reduced labial seal;Poor awareness of bolus Oral Phase Functional Implications:  (anterior spillage inconsistently) Pharyngeal  Phase Impairments:  (none) Other Comments: impulsive drinking requiring monitoring    Nectar Thick Nectar Thick Liquid: Not tested   Honey Thick Honey Thick Liquid: Not tested   Puree Puree: Impaired Presentation: Spoon (fed; 10 trials) Oral Phase Impairments: Reduced labial seal;Poor awareness of bolus Oral Phase Functional Implications: Oral residue (slight; quick swallowing) Pharyngeal Phase Impairments:  (none)   Solid   GO    Solid: Not tested      Jerilynn Som, MS, CCC-SLP  [name],[name] 04/02/2015,2:32 PM

## 2015-04-02 NOTE — Progress Notes (Signed)
[facility] -  at [facility]                                                                                                                                                                                            Patient Demographics   Hannah Keller, is a 24 y.o. female, DOB - 1991/04/29, ZOX:[id number]  Admit date - 03/29/2015   Admitting Physician Houston Siren, MD  Outpatient Primary MD for the patient is Domenic Schwab, FNP   LOS - 4  Subjective: Today. Patient is alert oriented. Less agitated. Does have some tremors. But the after speaking to the catheterine   from speech therapy therapy ,she is a like at her baseline. According to the speech therapy , patient noted to have tremors, ataxia from August 22. Patient had outpatient follow-up for speech therapy on August 22n,this info was given by patient's caretaker from group home. Review of Systems:   Due to mental retardation unable to provide histrory  Vitals:   Filed Vitals:   04/02/15 0300 04/02/15 0400 04/02/15 0500 04/02/15 0600  BP: 108/71 97/54 95/60  108/63  Pulse: 96 80 86 82  Temp:      TempSrc:      Resp: 27 34 24 25  Height:      Weight:      SpO2: 98% 96% 100% 100%    Wt Readings from Last 3 Encounters:  03/31/15 70.9 kg (156 lb 4.9 oz)  03/23/15 77.111 kg (170 lb)  12/13/14 77.565 kg (171 lb)     Intake/Output Summary (Last 24 hours) at 04/02/15 1104 Last data filed at 04/02/15 0600  Gross per 24 hour  Intake    990 ml  Output   2350 ml  Net  -1360 ml    Physical Exam:   GENERAL: Critically ill-appearing  HEAD, EYES, EARS, NOSE AND THROAT: Atraumatic, normocephalic. . Pupils equal and reactive to light. Sclerae anicteric. No conjunctival injection. No oro-pharyngeal erythema.  NECK: Supple. There is no jugular venous distention. No bruits, no lymphadenopathy, no thyromegaly.  HEART: Regular rate and rhythm,. No murmurs, no rubs, no clicks.  LUNGS: Clear to  auscultation bilaterally. No rales or rhonchi. No wheezes.  ABDOMEN: Soft, flat, nontender, nondistended. Has good bowel sounds. No hepatosplenomegaly appreciated.  EXTREMITIES: No evidence of any cyanosis, clubbing, or peripheral edema.  +2 pedal and radial pulses bilaterally.  NEUROLOGIC: Confused and agitated SKIN: Moist and warm with no rashes appreciated.  Psych: Confused and agitated LN: No inguinal LN enlargement    Antibiotics   Anti-infectives    Start     Dose/Rate Route Frequency  Ordered Stop   03/30/15 1330  levofloxacin (LEVAQUIN) IVPB 500 mg     500 mg 100 mL/hr over 60 Minutes Intravenous Daily-1800 03/30/15 1240     03/29/15 1950  valACYclovir (VALTREX) tablet 2,000 mg  Status:  Discontinued     2,000 mg Oral 2 times daily PRN 03/29/15 1952 03/31/15 1307      Medications   Scheduled Meds: . heparin  5,000 Units Subcutaneous 3 times per day  . levofloxacin (LEVAQUIN) IV  500 mg Intravenous q1800  . nystatin cream  1 application Topical BID  . olopatadine  1 drop Both Eyes BID  . valproate sodium  750 mg Intravenous Q12H   Continuous Infusions: . sodium chloride 125 mL/hr at 04/02/15 0449  . DOPamine Stopped (04/01/15 1300)   PRN Meds:.sodium chloride, sodium chloride, alteplase, feeding supplement (NEPRO CARB STEADY), heparin, lidocaine (PF), lidocaine-prilocaine, LORazepam, pentafluoroprop-tetrafluoroeth   Data Review:   Micro Results Recent Results (from the past 240 hour(s))  MRSA PCR Screening     Status: None   Collection Time: 03/29/15  3:53 PM  Result Value Ref Range Status   MRSA by PCR NEGATIVE NEGATIVE Final    Comment:        The GeneXpert MRSA Assay (FDA approved for NASAL specimens only), is one component of a comprehensive MRSA colonization surveillance program. It is not intended to diagnose MRSA infection nor to guide or monitor treatment for MRSA infections.     Radiology Reports Dg Chest 2 View  03/29/2015   CLINICAL DATA:   Congestion after vomiting.  EXAM: CHEST  2 VIEW  COMPARISON:  None.  FINDINGS: Marked hypoventilation, limiting sensitivity. No gross asymmetry in aeration. No effusion or air leak. Normal heart size and mediastinal contours.  Stool dilated colon visualized in the upper abdomen.  IMPRESSION: 1. Very limited low volume chest, cannot exclude aspiration pneumonitis. 2. Marked stool retention in the upper abdomen.   Electronically Signed   By: Marnee Spring M.D.   On: 03/29/2015 15:33   Mr Brain Wo Contrast  03/23/2015   CLINICAL DATA:  24 year old female recently diagnosed with ear infection status post antibiotics but continued ataxia, unsteady gait. Initial encounter.  EXAM: MRI HEAD WITHOUT CONTRAST  TECHNIQUE: Multiplanar, multiecho pulse sequences of the brain and surrounding structures were obtained without intravenous contrast.  COMPARISON:  [facility] Brain MRI 12/31/2012  FINDINGS: Rapid scanning technique employed.  Chronic widespread hyperostosis of the skull. Stable cerebral volume. Major intracranial vascular flow voids appear stable. No restricted diffusion or evidence of acute infarction. No ventriculomegaly (stable mild colpocephaly). No midline shift or intracranial mass lesion. No acute or chronic intracranial blood products identified. No encephalomalacia identified. Wallace Cullens and white matter signal appears stable. Negative pituitary and cervicomedullary junction. Stable visualized cervical spine.  There is new right ethmoid air cell fluid/opacification. Mild bilateral maxillary sinus mucosal thickening is new. Other Visualized paranasal sinuses and mastoids are clear. No tympanic cavity fluid. Chronic diminutive appearance of the IAC.  Orbits soft tissues appear stable and within normal limits. Scalp soft tissues appear normal.  IMPRESSION: 1. Stable noncontrast MRI appearance of the brain since 2014. 2. Mild-to-moderate paranasal sinus inflammation primarily at the right ethmoids. Mastoids  and tympanic cavities are clear. 3. 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 If there are multiple cranial neuropathies consider cranial nerve entrapment such as in hyperostosis cranialis interna.   Electronically Signed   By: Odessa Fleming M.D.   On: 03/23/2015 14:03  CBC  Recent Labs Lab 03/29/15 1604 03/30/15 0536 03/31/15 0504 03/31/15 1444 04/02/15 0445  WBC 35.2* 29.7* 22.9* 16.4* 20.3*  HGB 11.4* 9.8* 10.3* 10.4* 8.8*  HCT 34.4* 29.9* 30.6* 31.6* 26.8*  PLT 129* 86* 84* 87* 98*  MCV 96.7 95.0 94.3 94.1 95.6  MCH 32.0 31.1 31.9 30.9 31.5  MCHC 33.1 32.7 33.8 32.9 32.9  RDW 14.1 13.8 13.5 13.3 13.8    Chemistries   Recent Labs Lab 03/29/15 1604 03/30/15 0536 03/30/15 1615 03/31/15 0504 03/31/15 1444 04/01/15 0512 04/01/15 0940 04/02/15 0445  NA 148* 141  --  137 137 140  140  --  144  K 3.5 2.9* 4.1 3.5 4.0 3.7  3.7  --  3.6  CL 116* 106  --  105 105 105  106  --  111  CO2 28 30  --  --  29  GLUCOSE 101* 134*  --  121* 116* 129*  129*  --  134*  BUN 42* 29*  --  23* --  19  CREATININE 2.45* 1.72*  --  1.51* 1.18* 1.40*  1.48*  --  1.41*  CALCIUM 11.3* 9.0  --  9.9 9.8 10.5*  10.4*  --  9.8  MG  --  2.3  --  2.1  --   --  2.1 2.1  AST 37  --   --  32  --   --   --   --   ALT 24  --   --  21  --   --   --   --   ALKPHOS 115  --   --  108  --   --   --   --   BILITOT 0.4  --   --  0.6  --   --   --   --    ------------------------------------------------------------------------------------------------------------------ estimated creatinine clearance is 54.1 mL/min (by C-G formula based on Cr of 1.41). ------------------------------------------------------------------------------------------------------------------ No results for input(s): HGBA1C in the last 72 hours. ------------------------------------------------------------------------------------------------------------------ No results for input(s):  CHOL, HDL, LDLCALC, TRIG, CHOLHDL, LDLDIRECT in the last 72 hours. ------------------------------------------------------------------------------------------------------------------  Recent Labs  04/02/15 0141  TSH 2.094   ------------------------------------------------------------------------------------------------------------------ No results for input(s): VITAMINB12, FOLATE, FERRITIN, TIBC, IRON, RETICCTPCT in the last 72 hours.  Coagulation profile No results for input(s): INR, PROTIME in the last 168 hours.  No results for input(s): DDIMER in the last 72 hours.  Cardiac Enzymes  Recent Labs Lab 04/01/15 0940 04/02/15 0141 04/02/15 0921  TROPONINI <0.03 <0.03 <0.03   ------------------------------------------------------------------------------------------------------------------ Invalid input(s): POCBNP    Assessment & Plan   24 year old female with past medical history of mental retardation, paranoid schizophrenia, hypertension, history of seizures, who presents to the hospital due to ataxic gait, altered mental status and noted to have lithium toxicity.  #1 altered mental status/encephalopathy-this is likely metabolic in nature and related to lithium toxicity. Slowly improving  #2 pauses  : Possibly related to her lithium toxicity, pauses are less frequent. Seen by Cardiology.  #3 lithium toxicity-this is likely the setting of patient's increasing dose and also probably underlying acute renal failure. Status post hemodialysis with resolution, seen by nephrology, patient lithium levels are down, no further indication for dialysis at this time. Patient is followed by psychiatrically Dr. Toni Amend regarding lithium use in future.  #4ataxia-this is likely related to the patient's lithium toxicity. His PTt therapy evaluation once stable  #5 acute renal failure-this is  likely in setting of dehydration and poor by mouth intake. Patient's renal function is stable creatinine  possibly falsely low due to hemodialysis   #5 leukocytosis- trending down no real source of infection identified   #6 hypertension blood pressure meds on hold for now on dopamine for low blood per  #7 history of seizures-no evidence of acute seizure type activity. On Depakote IV due to patient not able to take much by mouth   #8 severe hypok repalced  #9 severe constipation on xray, AIDS bowel regimen. Nutrition: Seen by speech,will be restarted on diet.    Code Status Orders        Start     Ordered   03/29/15 1951  Full code   Continuous     03/29/15 1952           Consults  nephrology     Lab Results  Component Value Date   PLT 98* 04/02/2015     Time Spent in minutes   of critical care    Harveen Flesch M.D on 04/02/2015 at 11:04 AM  Between 7am to 6pm - Pager - [phone]  After 6pm go to [email or web address] - password [email or web address] [facility]  [facility]   Office  [phone]

## 2015-04-02 NOTE — Progress Notes (Signed)
[facility]  ROUNDING NOTE   Subjective:  Pt seen at bedside. Much less agitated today. Was able to answer a few simple questions. Not as restless. Lithium level was 1.1 at last check.   Objective:  Vital signs in last 24 hours:  Temp:  [98.1 F (36.7 C)-99.8 F (37.7 C)] 99.8 F (37.7 C) (09/02 0200) Pulse Rate:  [78-112] 82 (09/02 0600) Resp:  [13-34] 25 (09/02 0600) BP: (93-125)/(54-91) 108/63 mmHg (09/02 0600) SpO2:  [91 %-100 %] 100 % (09/02 0600)  Weight change:  Filed Weights   03/29/15 1454 03/31/15 1415  Weight: 77.111 kg (170 lb) 70.9 kg (156 lb 4.9 oz)    Intake/Output: I/O last 3 completed shifts: In: 2365 [I.V.:2250; IV Piggyback:115] Out: 3525 [Urine:3525]   Intake/Output this shift:     Physical Exam: General: Resting comfortably in bed  Head: Normocephalic, atraumatic. Moist oral mucosal membranes  Eyes: Anicteric  Neck: Supple, trachea midline  Lungs:  Clear to auscultation normal effort  Heart: S1S2 no rubs  Abdomen:  Soft, nontender, BS present  Extremities: no peripheral edema.  Neurologic: Awake but very agitated  Skin: No lesions  Access: Right femoral temp HD catheter    Basic Metabolic Panel:  Recent Labs Lab 03/30/15 0536 03/30/15 1615 03/31/15 0504 03/31/15 1444 04/01/15 0512 04/01/15 0940 04/02/15 0445  NA 141  --  137 137 140  140  --  144  K 2.9* 4.1 3.5 4.0 3.7  3.7  --  3.6  CL 106  --  105 105 105  106  --  111  CO2 30  --  28 29 30  29   --  29  GLUCOSE 134*  --  121* 116* 129*  129*  --  134*  BUN 29*  --  23* 19 16  16   --  19  CREATININE 1.72*  --  1.51* 1.18* 1.40*  1.48*  --  1.41*  CALCIUM 9.0  --  9.9 9.8 10.5*  10.4*  --  9.8  MG 2.3  --  2.1  --   --  2.1 2.1  PHOS 3.6  --  3.2 2.5 3.3  --  3.7    Liver Function Tests:  Recent Labs Lab 03/29/15 1604 03/31/15 0504 03/31/15 1444 04/01/15 0512  AST 37 32  --   --   ALT 24 21  --   --   ALKPHOS 115 108  --   --   BILITOT 0.4  0.6  --   --   PROT 7.0 5.5*  --   --   ALBUMIN 3.3* 2.5* 2.6* 2.3*    Recent Labs Lab 03/29/15 1604  LIPASE 300*    Recent Labs Lab 03/31/15 2355  AMMONIA 40*    CBC:  Recent Labs Lab 03/29/15 1604 03/30/15 0536 03/31/15 0504 03/31/15 1444 04/02/15 0445  WBC 35.2* 29.7* 22.9* 16.4* 20.3*  HGB 11.4* 9.8* 10.3* 10.4* 8.8*  HCT 34.4* 29.9* 30.6* 31.6* 26.8*  MCV 96.7 95.0 94.3 94.1 95.6  PLT 129* 86* 84* 87* 98*    Cardiac Enzymes:  Recent Labs Lab 04/01/15 0940 04/02/15 0141  TROPONINI <0.03 <0.03    BNP: Invalid input(s): POCBNP  CBG:  Recent Labs Lab 03/29/15 2018  GLUCAP 51    Microbiology: Results for orders placed or performed during the hospital encounter of 03/29/15  MRSA PCR Screening     Status: None   Collection Time: 03/29/15  3:53 PM  Result Value Ref Range Status  MRSA by PCR NEGATIVE NEGATIVE Final    Comment:        The GeneXpert MRSA Assay (FDA approved for NASAL specimens only), is one component of a comprehensive MRSA colonization surveillance program. It is not intended to diagnose MRSA infection nor to guide or monitor treatment for MRSA infections.     Coagulation Studies: No results for input(s): LABPROT, INR in the last 72 hours.  Urinalysis: No results for input(s): COLORURINE, LABSPEC, PHURINE, GLUCOSEU, HGBUR, BILIRUBINUR, KETONESUR, PROTEINUR, UROBILINOGEN, NITRITE, LEUKOCYTESUR in the last 72 hours.  Invalid input(s): APPERANCEUR    Imaging: No results found.   Medications:   . sodium chloride 125 mL/hr at 04/02/15 0449  . DOPamine Stopped (04/01/15 1300)   . heparin  5,000 Units Subcutaneous 3 times per day  . levofloxacin (LEVAQUIN) IV  500 mg Intravenous q1800  . nystatin cream  1 application Topical BID  . olopatadine  1 drop Both Eyes BID  . valproate sodium  750 mg Intravenous Q12H   sodium chloride, sodium chloride, alteplase, feeding supplement (NEPRO CARB STEADY), heparin, lidocaine  (PF), lidocaine-prilocaine, LORazepam, pentafluoroprop-tetrafluoroeth  Assessment/ Plan:  24 y.o. female with a PMHx of mental retardation, paranoid schizophrenia, seizures, developmental delay, who was admitted to [facility] on 03/29/2015 for evaluation of ataxia, nausea, vomiting, and altered mental status.   1. Lithium toxicity R78.89 2. Acute renal failure/CKD stage II 3. Hypernatremia, resolved. 4. Hypotension. 5.  Hypokalemia. 6.  Thrombocytopenia.    Plan:  Lithium level stable at 1.1.  Hopefully this will continue to come down.  Pt much less restless today, and much less agitated.  She was able to answer several simple questions.  She appears to have underlying CKD stage II, her Cr currently is 1.4 with an EGFR of 60.  Will discontinue dialysis catheter at this point in time.  Would try to find an alternative to lithium given her renal insufficiency and quite significant lithium toxicity this admission.

## 2015-04-02 NOTE — Consult Note (Signed)
[facility] [place] Psychiatry Consult   Reason for Consult:  Consult and follow-up for this 24 year old woman with mental retardation and chronic behavior problems who presented to the hospital with lithium toxicity Referring Physician:  Verdell Carmine Patient Identification: Hannah Keller MRN:  [id number] Principal Diagnosis: Lithium toxicity Diagnosis:   Patient Active Problem List   Diagnosis Date Noted  . Acute renal failure syndrome [N17.9]   . 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 Sinus pause [I45.5]   . Lithium toxicity [T56.891A] 03/29/2015  . 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 female patient admitted with patient is not able to offer any history at this time. Unclear how uncomfortable she might be.Marland Kitchen  HPI:  Follow-up on yesterday's note. This 24 year old woman was brought in by the staff at her family care home. They described what sounded like a week or 2 of worsening mental state with less verbal communication than normal and gradually increasing trouble walking to the point where she was not able to ambulate at all. There had not been any known changes to her medication in the last few weeks. A little over a month ago apparently she did have an increase in her prescribed lithium dose by 33% total. Also had recently started a modest dose of topiramate.  Patient has chronic intellectual disability. Probably in the moderate range. Not entirely clear to me from how the caretakers describe it but apparently she is able to talk somewhat and do some self-care at least when she is at her baseline. She's been on multiple medications suggesting diagnosis of other mental health conditions. Probably chronic behavior problems. Diagnosis of schizophrenia is offered here. Not entirely sure as we don't have a lot of other past psychiatric detail.  Medical history: Patient presented with a lithium level of  4.4 and an elevated creatinine of 2. She does have a apparent history of probably gastric reflux possibly some form of herpes.  Social history: Patient has a legal guardian with the Department of Social Services. No known family involved. Has lived in the same family care home for many years and they are quite familiar with her.  Substance abuse history: None  Follow-up as of Friday the second. Patient is awake and alert. Interacts a little bit. Emotions are still labile. Lithium level has come down to below 1 and appears to be stable. Dialysis has been discontinued. The rest of her electrolytes are looking good. Nursing tells me that she may be depended for discharge or at least released from the critical care unit within the next day. Her guardian has not been by to see her yet today but hopefully will be this evening. HPI Elements:   Quality:  Confusion and ataxia delirium. Severity:  Severe life-threatening. Timing:  Gradually progressive over at least a week. Duration:  Ongoing. Context:  Some increased lithium level or dose.  Past Medical History:  Past Medical History  Diagnosis Date  . Mental retardation     Mild  . Paranoid schizophrenia   . Seizures   . Developmental delay    History reviewed. No pertinent past surgical history. Family History: No family history on file. Social History:  History  Alcohol Use No     History  Drug Use No    Social History   Social History  . Marital Status: Single    Spouse Name: N/A  . Number of Children:  0  . Years of Education: n/a   Occupational History  . Disabled    Social History Main Topics  . Smoking status: Never Smoker   . Smokeless tobacco: Never Used  . Alcohol Use: No  . Drug Use: No  . Sexual Activity: Not Asked   Other Topics Concern  . None   Social History Narrative   Pt currently resides at [facility].   Caffeine Use: None   Additional Social History:                           Allergies:   Allergies  Allergen Reactions  . Ritalin [Methylphenidate Hcl] Other (See Comments)    Reaction:  Unknown     Labs:  Results for orders placed or performed during the hospital encounter of 03/29/15 (from the past 48 hour(s))  Ammonia     Status: Abnormal   Collection Time: 03/31/15 11:55 PM  Result Value Ref Range   Ammonia 40 (H) 9 - 35 umol/L    Comment: HEMOLYSIS AT THIS LEVEL MAY AFFECT RESULT  Basic metabolic panel     Status: Abnormal   Collection Time: 04/01/15  5:12 AM  Result Value Ref Range   Sodium 140 135 - 145 mmol/L   Potassium 3.7 3.5 - 5.1 mmol/L   Chloride 106 101 - 111 mmol/L   CO2 29 22 - 32 mmol/L   Glucose, Bld 129 (H) 65 - 99 mg/dL   BUN 16 6 - 20 mg/dL   Creatinine, Ser 1.48 (H) 0.44 - 1.00 mg/dL   Calcium 10.4 (H) 8.9 - 10.3 mg/dL   GFR calc non Af Amer 49 (L) >60 mL/min   GFR calc Af Amer 56 (L) >60 mL/min    Comment: (NOTE) The eGFR has been calculated using the CKD EPI equation. This calculation has not been validated in all clinical situations. eGFR's persistently <60 mL/min signify possible Chronic Kidney Disease.    Anion gap 5 5 - 15  Lithium level     Status: None   Collection Time: 04/01/15  5:12 AM  Result Value Ref Range   Lithium Lvl 1.13 0.60 - 1.20 mmol/L  Renal function panel     Status: Abnormal   Collection Time: 04/01/15  5:12 AM  Result Value Ref Range   Sodium 140 135 - 145 mmol/L   Potassium 3.7 3.5 - 5.1 mmol/L   Chloride 105 101 - 111 mmol/L   CO2 30 22 - 32 mmol/L   Glucose, Bld 129 (H) 65 - 99 mg/dL   BUN 16 6 - 20 mg/dL   Creatinine, Ser 1.40 (H) 0.44 - 1.00 mg/dL   Calcium 10.5 (H) 8.9 - 10.3 mg/dL   Phosphorus 3.3 2.5 - 4.6 mg/dL   Albumin 2.3 (L) 3.5 - 5.0 g/dL   GFR calc non Af Amer 52 (L) >60 mL/min   GFR calc Af Amer >60 >60 mL/min    Comment: (NOTE) The eGFR has been calculated using the CKD EPI equation. This calculation has not been validated in all clinical situations. eGFR's  persistently <60 mL/min signify possible Chronic Kidney Disease.    Anion gap 5 5 - 15  Blood gas, arterial     Status: Abnormal   Collection Time: 04/01/15  9:05 AM  Result Value Ref Range   FIO2 0.21    pH, Arterial 7.46 (H) 7.350 - 7.450   pCO2 arterial 42 32.0 - 48.0 mmHg  pO2, Arterial 60 (L) 83.0 - 108.0 mmHg   Bicarbonate 29.9 (H) 21.0 - 28.0 mEq/L   Acid-Base Excess 5.4 (H) 0.0 - 3.0 mmol/L   O2 Saturation 92.0 %   Patient temperature 37.0    Collection site RIGHT BRACHIAL    Sample type ARTERIAL DRAW    Allens test (pass/fail) POSITIVE (A) PASS  Magnesium     Status: None   Collection Time: 04/01/15  9:40 AM  Result Value Ref Range   Magnesium 2.1 1.7 - 2.4 mg/dL  Troponin I     Status: None   Collection Time: 04/01/15  9:40 AM  Result Value Ref Range   Troponin I <0.03 <0.031 ng/mL    Comment:        NO INDICATION OF MYOCARDIAL INJURY.   TSH     Status: None   Collection Time: 04/02/15  1:41 AM  Result Value Ref Range   TSH 2.094 0.350 - 4.500 uIU/mL  Troponin I     Status: None   Collection Time: 04/02/15  1:41 AM  Result Value Ref Range   Troponin I <0.03 <0.031 ng/mL    Comment:        NO INDICATION OF MYOCARDIAL INJURY.   CBC     Status: Abnormal   Collection Time: 04/02/15  4:45 AM  Result Value Ref Range   WBC 20.3 (H) 3.6 - 11.0 K/uL   RBC 2.81 (L) 3.80 - 5.20 MIL/uL   Hemoglobin 8.8 (L) 12.0 - 15.0 g/dL    Comment: RESULT REPEATED AND VERIFIED RESULTS FROM RECOLLECTED SAMPLE AT 0700    HCT 26.8 (L) 35.0 - 47.0 %   MCV 95.6 80.0 - 100.0 fL   MCH 31.5 26.0 - 34.0 pg   MCHC 32.9 32.0 - 36.0 g/dL   RDW 13.8 11.5 - 14.5 %   Platelets 98 (L) 150 - 440 K/uL  Basic metabolic panel     Status: Abnormal   Collection Time: 04/02/15  4:45 AM  Result Value Ref Range   Sodium 144 135 - 145 mmol/L   Potassium 3.6 3.5 - 5.1 mmol/L   Chloride 111 101 - 111 mmol/L   CO2 29 22 - 32 mmol/L   Glucose, Bld 134 (H) 65 - 99 mg/dL   BUN 19 6 - 20 mg/dL    Creatinine, Ser 1.41 (H) 0.44 - 1.00 mg/dL   Calcium 9.8 8.9 - 10.3 mg/dL   GFR calc non Af Amer 52 (L) >60 mL/min   GFR calc Af Amer 60 (L) >60 mL/min    Comment: (NOTE) The eGFR has been calculated using the CKD EPI equation. This calculation has not been validated in all clinical situations. eGFR's persistently <60 mL/min signify possible Chronic Kidney Disease.    Anion gap 4 (L) 5 - 15  Magnesium     Status: None   Collection Time: 04/02/15  4:45 AM  Result Value Ref Range   Magnesium 2.1 1.7 - 2.4 mg/dL  Phosphorus     Status: None   Collection Time: 04/02/15  4:45 AM  Result Value Ref Range   Phosphorus 3.7 2.5 - 4.6 mg/dL  Troponin I     Status: None   Collection Time: 04/02/15  9:21 AM  Result Value Ref Range   Troponin I <0.03 <0.031 ng/mL    Comment:        NO INDICATION OF MYOCARDIAL INJURY.   Lithium level     Status: None   Collection Time: 04/02/15  9:21 AM  Result Value Ref Range   Lithium Lvl 0.80 0.60 - 1.20 mmol/L  Troponin I     Status: None   Collection Time: 04/02/15  6:11 PM  Result Value Ref Range   Troponin I <0.03 <0.031 ng/mL    Comment:        NO INDICATION OF MYOCARDIAL INJURY.     Vitals: Blood pressure 123/107, pulse 96, temperature 98.4 F (36.9 C), temperature source Axillary, resp. rate 27, height 5' (1.524 m), weight 70.9 kg (156 lb 4.9 oz), SpO2 100 %.  Risk to Self: Is patient at risk for suicide?: No Risk to Others:   Prior Inpatient Therapy:   Prior Outpatient Therapy:    Current Facility-Administered Medications  Medication Dose Route Frequency Provider Last Rate Last Dose  . 0.45 % sodium chloride infusion   Intravenous Continuous Munsoor Lateef, MD 125 mL/hr at 04/02/15 1719    . 0.9 %  sodium chloride infusion  100 mL Intravenous PRN Munsoor Lateef, MD      . 0.9 %  sodium chloride infusion  100 mL Intravenous PRN Munsoor Lateef, MD      . alteplase (CATHFLO ACTIVASE) injection 2 mg  2 mg Intracatheter Once PRN Munsoor  Lateef, MD      . DOPamine (INTROPIN) 800 mg in dextrose 5 % 250 mL (3.2 mg/mL) infusion  2-20 mcg/kg/min Intravenous Continuous Wilhelmina Mcardle, MD   Stopped at 04/01/15 1300  . feeding supplement (NEPRO CARB STEADY) liquid 237 mL  237 mL Oral PRN Munsoor Lateef, MD      . heparin injection 1,000 Units  1,000 Units Dialysis PRN Munsoor Lateef, MD   3,600 Units at 03/31/15 1636  . heparin injection 5,000 Units  5,000 Units Subcutaneous 3 times per day Henreitta Leber, MD   5,000 Units at 04/02/15 1400  . levofloxacin (LEVAQUIN) IVPB 500 mg  500 mg Intravenous q1800 Dustin Flock, MD   500 mg at 04/02/15 1719  . lidocaine (PF) (XYLOCAINE) 1 % injection 5 mL  5 mL Intradermal PRN Munsoor Lateef, MD      . lidocaine-prilocaine (EMLA) cream 1 application  1 application Topical PRN Munsoor Lateef, MD      . Derrill Memo ON 04/03/2015] lithium carbonate capsule 300 mg  300 mg Oral QHS Gonzella Lex, MD      . LORazepam (ATIVAN) injection 2 mg  2 mg Intravenous Q4H PRN Lytle Butte, MD   2 mg at 04/02/15 0255  . nystatin cream (MYCOSTATIN) 1 application  1 application Topical BID Henreitta Leber, MD   1 application at 94/76/54 [phone]  . olopatadine (PATANOL) 0.1 % ophthalmic solution 1 drop  1 drop Both Eyes BID Henreitta Leber, MD   1 drop at 04/02/15 0923  . pentafluoroprop-tetrafluoroeth (GEBAUERS) aerosol 1 application  1 application Topical PRN Munsoor Lateef, MD      . valproate (DEPACON) 750 mg in dextrose 5 % 50 mL IVPB  750 mg Intravenous Q12H Dustin Flock, MD   750 mg at 04/02/15 5465    Musculoskeletal: Strength & Muscle Tone: decreased Gait & Station: unable to stand Patient leans: N/A  Psychiatric Specialty Exam: Physical Exam  Nursing note and vitals reviewed. Constitutional: She appears well-developed and well-nourished. She appears lethargic. She is uncooperative. She appears toxic.  HENT:  Head: Normocephalic and atraumatic.    Eyes: Conjunctivae are normal. Pupils are equal, round,  and reactive to light.  Neck: Normal range of motion.  Cardiovascular:  Normal heart sounds.   Respiratory: Effort normal.  GI: Soft.  Musculoskeletal: Normal range of motion.  Neurological: She appears lethargic. She exhibits abnormal muscle tone. Coordination abnormal.  Skin: Skin is warm and dry.  Psychiatric: Her affect is not labile. Her speech is slurred. She is slowed. She is not agitated. Cognition and memory are impaired. She does not express impulsivity. She is communicative.  Patient is responding much better today. She is able to speak several words in a row and answer questions lucidly at times. Interacting well with the group home owner.    Review of Systems  Unable to perform ROS Constitutional: Negative.   HENT: Negative.   Eyes: Negative.   Respiratory: Negative.   Cardiovascular: Negative.   Gastrointestinal: Positive for abdominal pain.  Musculoskeletal: Negative.   Skin: Negative.   Neurological: Negative.   Psychiatric/Behavioral: Negative for depression, suicidal ideas, hallucinations and substance abuse. The patient is nervous/anxious. The patient does not have insomnia.     Blood pressure 123/107, pulse 96, temperature 98.4 F (36.9 C), temperature source Axillary, resp. rate 27, height 5' (1.524 m), weight 70.9 kg (156 lb 4.9 oz), SpO2 100 %.Body mass index is 30.53 kg/(m^2).  General Appearance: Fairly Groomed  Engineer, water::  Minimal  Speech:  Garbled  Volume:  Decreased  Mood:  Not stated  Affect:  Flat  Thought Process:  Not able to really identify  Orientation:  Negative  Thought Content:  Negative  Suicidal Thoughts:  No  Homicidal Thoughts:  No  Memory:  Negative  Judgement:  Impaired  Insight:  Lacking  Psychomotor Activity:  Decreased  Concentration:  Poor  Recall:  Poor  Fund of Knowledge:Poor  Language: Poor  Akathisia:  No  Handed:  Right  AIMS (if indicated):     Assets:  Housing Social Support  ADL's:  Impaired  Cognition:  Impaired,  Severe  Sleep:      Medical Decision Making: New problem, with additional work up planned, Review or order clinical lab tests (1), Review or order medicine tests (1) and Review of Medication Regimen & Side Effects (2)  Treatment Plan Summary: Plan Mental he seems to be coming back close to her baseline. Exactly how close am not sure. Lithium was part of her treatment plan for years. Kidney function has improved and lithium level has come down to the point that I think it would be appropriate to restart lithium and that she is likely to tolerate it well. I will be very careful with starting her back on just 300 mg at night starting tomorrow night. Please contact psychiatrist on call over the weekend if necessary.  Plan:  Continue monitoring. Disposition: No need for psychiatric hospitalization  Hannah Keller 04/02/2015 6:49 PM

## 2015-04-02 NOTE — Progress Notes (Addendum)
Patient: Hannah Keller / Admit Date: 03/29/2015 / Date of Encounter: 04/02/2015, 7:38 AM   Subjective: Unable to converse 2/2 mental acuity. Echo pending. No acute events overnight.   Review of Systems: Review of Systems  Unable to perform ROS: mental acuity     Objective: Telemetry: NSR, 90s, 4.1 second pause this morning - asymptomatic (not associated with suctioning or sleep)  Physical Exam: Blood pressure 108/63, pulse 82, temperature 99.8 F (37.7 C), temperature source Axillary, resp. rate 25, height 5' (1.524 m), weight 156 lb 4.9 oz (70.9 kg), SpO2 100 %. Body mass index is 30.53 kg/(m^2). General: Well developed, well nourished, in no acute distress. Head: Normocephalic, atraumatic, sclera non-icteric, no xanthomas, nares are without discharge. Neck: Negative for carotid bruits. JVP not elevated. Lungs: Clear bilaterally to auscultation without wheezes, rales, or rhonchi. Breathing is unlabored. Heart: RRR S1 S2 without murmurs, rubs, or gallops.  Abdomen: Soft, non-tender, non-distended with normoactive bowel sounds. No rebound/guarding. Extremities: No clubbing or cyanosis. No edema. Distal pedal pulses are 2+ and equal bilaterally. Neuro: Alert and oriented X 3. Moves all extremities spontaneously. Psych:  Responds to questions appropriately with a normal affect.   Intake/Output Summary (Last 24 hours) at 04/02/15 0738 Last data filed at 04/02/15 0600  Gross per 24 hour  Intake    990 ml  Output   2750 ml  Net  -1760 ml    Inpatient Medications:  . heparin  5,000 Units Subcutaneous 3 times per day  . levofloxacin (LEVAQUIN) IV  500 mg Intravenous q1800  . nystatin cream  1 application Topical BID  . olopatadine  1 drop Both Eyes BID  . valproate sodium  750 mg Intravenous Q12H   Infusions:  . sodium chloride 125 mL/hr at 04/02/15 0449  . DOPamine Stopped (04/01/15 1300)    Labs:  Recent Labs  04/01/15 0512 04/01/15 0940 04/02/15 0445  NA 140  140  --   144  K 3.7  3.7  --  3.6  CL 105  106  --  111  CO2 30  29  --  29  GLUCOSE 129*  129*  --  134*  BUN 16  16  --  19  CREATININE 1.40*  1.48*  --  1.41*  CALCIUM 10.5*  10.4*  --  9.8  MG  --  2.1 2.1  PHOS 3.3  --  3.7    Recent Labs  03/31/15 0504 03/31/15 1444 04/01/15 0512  AST 32  --   --   ALT 21  --   --   ALKPHOS 108  --   --   BILITOT 0.6  --   --   PROT 5.5*  --   --   ALBUMIN 2.5* 2.6* 2.3*    Recent Labs  03/31/15 1444 04/02/15 0445  WBC 16.4* 20.3*  HGB 10.4* 8.8*  HCT 31.6* 26.8*  MCV 94.1 95.6  PLT 87* 98*    Recent Labs  04/01/15 0940 04/02/15 0141  TROPONINI <0.03 <0.03   Invalid input(s): POCBNP No results for input(s): HGBA1C in the last 72 hours.   Weights: Filed Weights   03/29/15 1454 03/31/15 1415  Weight: 170 lb (77.111 kg) 156 lb 4.9 oz (70.9 kg)     Radiology/Studies:  Dg Chest 2 View  03/29/2015   CLINICAL DATA:  Congestion after vomiting.  EXAM: CHEST  2 VIEW  COMPARISON:  None.  FINDINGS: Marked hypoventilation, limiting sensitivity. No gross asymmetry in aeration. No effusion  or air leak. Normal heart size and mediastinal contours.  Stool dilated colon visualized in the upper abdomen.  IMPRESSION: 1. Very limited low volume chest, cannot exclude aspiration pneumonitis. 2. Marked stool retention in the upper abdomen.   Electronically Signed   By: Marnee Spring M.D.   On: 03/29/2015 15:33   Mr Brain Wo Contrast  03/23/2015   CLINICAL DATA:  24 year old female recently diagnosed with ear infection status post antibiotics but continued ataxia, unsteady gait. Initial encounter.  EXAM: MRI HEAD WITHOUT CONTRAST  TECHNIQUE: Multiplanar, multiecho pulse sequences of the brain and surrounding structures were obtained without intravenous contrast.  COMPARISON:  [facility] Brain MRI 12/31/2012  FINDINGS: Rapid scanning technique employed.  Chronic widespread hyperostosis of the skull. Stable cerebral volume. Major  intracranial vascular flow voids appear stable. No restricted diffusion or evidence of acute infarction. No ventriculomegaly (stable mild colpocephaly). No midline shift or intracranial mass lesion. No acute or chronic intracranial blood products identified. No encephalomalacia identified. Wallace Cullens and white matter signal appears stable. Negative pituitary and cervicomedullary junction. Stable visualized cervical spine.  There is new right ethmoid air cell fluid/opacification. Mild bilateral maxillary sinus mucosal thickening is new. Other Visualized paranasal sinuses and mastoids are clear. No tympanic cavity fluid. Chronic diminutive appearance of the IAC.  Orbits soft tissues appear stable and within normal limits. Scalp soft tissues appear normal.  IMPRESSION: 1. Stable noncontrast MRI appearance of the brain since 2014. 2. Mild-to-moderate paranasal sinus inflammation primarily at the right ethmoids. Mastoids and tympanic cavities are clear. 3. 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 If there are multiple cranial neuropathies consider cranial nerve entrapment such as in hyperostosis cranialis interna.   Electronically Signed   By: Odessa Fleming M.D.   On: 03/23/2015 14:03     Assessment and Plan   1. Pauses: -Less frequent and decreased in length  -Possibly secondary to her lithium toxicity as this can persist for several days post improvement in level vs sleep apnea as she snores at baseline  -Asymptomatic  -Lytes ok -TSH ok -Check echo to evaluate LV function, wall motion, and right side pressure -Concern for possible need of PPM, this is certainly a possibility given her pauses though would would to exclude outside etiology given her age   2. AMS/encephalopathy: -In the setting of lithium toxicity  -Appears to be improving when reading through her hospitalization  3. Lithium toxicity: -Improved s/p HD  4. Psychosocial concerns: -Psych on board   5. ARF: -Per  Renal  6. Leukocytosis: -Possibly inflammation  7. Thrombocytopenia: -Hold heparin   8. Acute anemia: -No obvious signs of bleed per RN -Possible dilutional   Signed, Eula Listen, PA-C Pager: [phone] 04/02/2015, 7:38 AM   Attending Note Patient seen and examined, agree with detailed note above,  Patient presentation and plan discussed on rounds.   Continued pauses, AV node dysfunction. On telemetry review, she is having normal sinus rhythm with no evidence of sinus node disease. Strips show AV block, type II. --Suspect secondary to lithium, Still with mental status changes, very somnolent. In a supine position, likely asymptomatic. Blood pressure stable. We'll continue to monitor for now. We'll hopefully resolve with time, Currently no indication for pacemaker or external pacing.  Other issues may need attention including elevated WBC, anemia  Signed: Dossie Arbour  M.D., Ph.D.

## 2015-04-02 NOTE — Progress Notes (Deleted)
PHARMACY - CRITICAL CARE PROGRESS NOTE  Pharmacy Consult for Electrolyte Supplementation    Allergies  Allergen Reactions  . Ritalin [Methylphenidate Hcl] Other (See Comments)    Reaction:  Unknown     Patient Measurements: Height: 5' (152.4 cm) Weight: 70.9 kg (156 lb 4.9 oz) IBW/kg (Calculated) : 45.5   Vital Signs: Temp: 99.8 F (37.7 C) (09/02 0200) Temp Source: Axillary (09/02 0200) BP: 108/63 mmHg (09/02 0600) Pulse Rate: 82 (09/02 0600) Intake/Output from previous day: 09/01 0701 - 09/02 0700 In: 990 [I.V.:875; IV Piggyback:115] Out: 2750 [Urine:2750] Intake/Output from this shift:       Labs:  Recent Labs  03/31/15 0504 03/31/15 1444 04/01/15 0512 04/01/15 0940 04/02/15 0445  WBC 22.9* 16.4*  --   --  20.3*  HGB 10.3* 10.4*  --   --  8.8*  HCT 30.6* 31.6*  --   --  26.8*  PLT 84* 87*  --   --  98*  CREATININE 1.51* 1.18* 1.40*  1.48*  --  1.41*  MG 2.1  --   --  2.1 2.1  PHOS 3.2 2.5 3.3  --  3.7  ALBUMIN 2.5* 2.6* 2.3*  --   --   PROT 5.5*  --   --   --   --   AST 32  --   --   --   --   ALT 21  --   --   --   --   ALKPHOS 108  --   --   --   --   BILITOT 0.6  --   --   --   --    Estimated Creatinine Clearance: 54.1 mL/min (by C-G formula based on Cr of 1.41).  No results for input(s): GLUCAP in the last 72 hours. BMP Latest Ref Rng 04/02/2015 04/01/2015 04/01/2015  Glucose 65 - 99 mg/dL 782(N) 562(Z) 308(M)  BUN 6 - 20 mg/dL Creatinine 0.44 - 1.00 mg/dL 5.78(I) 6.96(E) 9.52(W)  Sodium 135 - 145 mmol/L 144 140 140  Potassium 3.5 - 5.1 mmol/L 3.6 3.7 3.7  Chloride 101 - 111 mmol/L 111 105 106  CO2 22 - 32 mmol/L Calcium 8.9 - 10.3 mg/dL 9.8 10.5(H) 10.4(H)    Medications:  Scheduled:  . heparin  5,000 Units Subcutaneous 3 times per day  . levofloxacin (LEVAQUIN) IV  500 mg Intravenous q1800  . nystatin cream  1 application Topical BID  . olopatadine  1 drop Both Eyes BID  . valproate sodium  750 mg Intravenous Q12H    Infusions:  . sodium chloride 125 mL/hr at 04/02/15 0449  . DOPamine Stopped (04/01/15 1300)    Assessment: Pharmacy consulted to correct electrolyte abnormalities in a 24 yo female admitted for lithium toxicity.  Patient underwent hemodialysis earlier this AM per poison control recommendations.  SCr improving but will need to follow closely.   Goal of Therapy:  Electrolytes within normal limits  Plan:  Electrolytes are wnl. Will recheck labs in AM.   Pharmacy will continue to follow.  Burlie Cajamarca 04/02/2015,11:03 AM

## 2015-04-02 NOTE — Progress Notes (Signed)
PHARMACY - CRITICAL CARE PROGRESS NOTE  Pharmacy Consult for Electrolyte Supplementation    Allergies  Allergen Reactions  . Ritalin [Methylphenidate Hcl] Other (See Comments)    Reaction:  Unknown     Patient Measurements: Height: 5' (152.4 cm) Weight: 156 lb 4.9 oz (70.9 kg) IBW/kg (Calculated) : 45.5   Vital Signs: Temp: 99.8 F (37.7 C) (09/02 0200) Temp Source: Axillary (09/02 0200) BP: 95/60 mmHg (09/02 0500) Pulse Rate: 86 (09/02 0500) Intake/Output from previous day: 09/01 0701 - 09/02 0700 In: 990 [I.V.:875; IV Piggyback:115] Out: 1800 [Urine:1800] Intake/Output from this shift:       Labs:  Recent Labs  03/31/15 0504 03/31/15 1444 04/01/15 0512 04/01/15 0940 04/02/15 0445  WBC 22.9* 16.4*  --   --   --   HGB 10.3* 10.4*  --   --   --   HCT 30.6* 31.6*  --   --   --   PLT 84* 87*  --   --   --   CREATININE 1.51* 1.18* 1.40*  1.48*  --  1.41*  MG 2.1  --   --  2.1 2.1  PHOS 3.2 2.5 3.3  --  3.7  ALBUMIN 2.5* 2.6* 2.3*  --   --   PROT 5.5*  --   --   --   --   AST 32  --   --   --   --   ALT 21  --   --   --   --   ALKPHOS 108  --   --   --   --   BILITOT 0.6  --   --   --   --    Estimated Creatinine Clearance: 54.1 mL/min (by C-G formula based on Cr of 1.41).  No results for input(s): GLUCAP in the last 72 hours. BMP Latest Ref Rng 04/02/2015 04/01/2015 04/01/2015  Glucose 65 - 99 mg/dL 161(W) 960(A) 540(J)  BUN 6 - 20 mg/dL Creatinine 0.44 - 1.00 mg/dL 8.11(B) 1.47(W) 2.95(A)  Sodium 135 - 145 mmol/L 144 140 140  Potassium 3.5 - 5.1 mmol/L 3.6 3.7 3.7  Chloride 101 - 111 mmol/L 111 105 106  CO2 22 - 32 mmol/L Calcium 8.9 - 10.3 mg/dL 9.8 10.5(H) 10.4(H)    Medications:  Scheduled:  . heparin  5,000 Units Subcutaneous 3 times per day  . levofloxacin (LEVAQUIN) IV  500 mg Intravenous q1800  . nystatin cream  1 application Topical BID  . olopatadine  1 drop Both Eyes BID  . valproate sodium  750 mg Intravenous Q12H    Infusions:  . sodium chloride 125 mL/hr at 04/02/15 0449  . DOPamine Stopped (04/01/15 1300)    Assessment: Pharmacy consulted to correct electrolyte abnormalities in a 24 yo female admitted for lithium toxicity.  Patient underwent hemodialysis earlier this AM per poison control recommendations.  SCr improving but will need to follow closely.   Goal of Therapy:  Electrolytes within normal limits  Plan:  Electrolytes are wnl. Will recheck labs in AM.   Pharmacy will continue to follow.  Carola Frost, Pharm.D. Clinical Pharmacist 04/02/2015,6:31 AM

## 2015-04-03 ENCOUNTER — Inpatient Hospital Stay: Payer: Medicaid Other

## 2015-04-03 DIAGNOSIS — R0902 Hypoxemia: Secondary | ICD-10-CM

## 2015-04-03 DIAGNOSIS — I498 Other specified cardiac arrhythmias: Secondary | ICD-10-CM

## 2015-04-03 DIAGNOSIS — G9340 Encephalopathy, unspecified: Secondary | ICD-10-CM

## 2015-04-03 LAB — TROPONIN I: Troponin I: 0.03 ng/mL (ref <0.031)

## 2015-04-03 LAB — CBC
HCT: 25.5 % — ABNORMAL LOW (ref 35.0–47.0)
HEMOGLOBIN: 8.4 g/dL — AB (ref 12.0–16.0)
MCH: 31.7 pg (ref 26.0–34.0)
MCHC: 33.1 g/dL (ref 32.0–36.0)
MCV: 95.7 fL (ref 80.0–100.0)
Platelets: 95 10*3/uL — ABNORMAL LOW (ref 150–440)
RBC: 2.66 MIL/uL — ABNORMAL LOW (ref 3.80–5.20)
RDW: 13.8 % (ref 11.5–14.5)
WBC: 17 10*3/uL — ABNORMAL HIGH (ref 3.6–11.0)

## 2015-04-03 LAB — BASIC METABOLIC PANEL
Anion gap: 3 — ABNORMAL LOW (ref 5–15)
BUN: 19 mg/dL (ref 6–20)
CO2: 27 mmol/L (ref 22–32)
Calcium: 9.7 mg/dL (ref 8.9–10.3)
Chloride: 112 mmol/L — ABNORMAL HIGH (ref 101–111)
Creatinine, Ser: 1.34 mg/dL — ABNORMAL HIGH (ref 0.44–1.00)
GFR calc Af Amer: >60 mL/min (ref >60)
GFR calc non Af Amer: 55 mL/min — ABNORMAL LOW (ref >60)
Glucose, Bld: 112 mg/dL — ABNORMAL HIGH (ref 65–99)
Potassium: 3.9 mmol/L (ref 3.5–5.1)
Sodium: 142 mmol/L (ref 135–145)

## 2015-04-03 MED ORDER — ZIPRASIDONE MESYLATE 20 MG IM SOLR
10.0000 mg | Freq: Once | INTRAMUSCULAR | Status: AC
  Administered 2015-04-03: 10 mg via INTRAMUSCULAR
  Filled 2015-04-03: qty 20

## 2015-04-03 MED ORDER — DIVALPROEX SODIUM 250 MG PO DR TAB
750.0000 mg | DELAYED_RELEASE_TABLET | Freq: Two times a day (BID) | ORAL | Status: DC
  Administered 2015-04-03 – 2015-04-06 (×6): 750 mg via ORAL
  Filled 2015-04-03 (×6): qty 3

## 2015-04-03 MED ORDER — ARIPIPRAZOLE 15 MG PO TABS
15.0000 mg | ORAL_TABLET | Freq: Every day | ORAL | Status: DC
  Administered 2015-04-03 – 2015-04-06 (×4): 15 mg via ORAL
  Filled 2015-04-03 (×4): qty 1

## 2015-04-03 MED ORDER — CLONAZEPAM 0.5 MG PO TABS
0.5000 mg | ORAL_TABLET | Freq: Four times a day (QID) | ORAL | Status: DC | PRN
  Administered 2015-04-03: 0.5 mg via ORAL
  Filled 2015-04-03: qty 1

## 2015-04-03 NOTE — Progress Notes (Signed)
PHARMACY - CRITICAL CARE PROGRESS NOTE  Pharmacy Consult for Electrolyte Supplementation    Allergies  Allergen Reactions  . Ritalin [Methylphenidate Hcl] Other (See Comments)    Reaction:  Unknown     Patient Measurements: Height: 5' (152.4 cm) Weight: 156 lb 4.9 oz (70.9 kg) IBW/kg (Calculated) : 45.5   Vital Signs: Temp: 97.9 F (36.6 C) (09/03 0000) BP: 107/59 mmHg (09/03 0552) Pulse Rate: 85 (09/03 0552) Intake/Output from previous day: 09/02 0701 - 09/03 0700 In: 5075 [P.O.:660; I.V.:4250; IV Piggyback:165] Out: 903 [Urine:903] Intake/Output from this shift: Total I/O In: 1345 [P.O.:420; I.V.:875; IV Piggyback:50] Out: 3 [Urine:3]  Vent Mode:  [-]  FiO2 (%):  [2 %] 2 %  Labs:  Recent Labs  03/31/15 1444 04/01/15 0512 04/01/15 0940 04/02/15 0445 04/03/15 0109  WBC 16.4*  --   --  20.3*  --   HGB 10.4*  --   --  8.8*  --   HCT 31.6*  --   --  26.8*  --   PLT 87*  --   --  98*  --   CREATININE 1.18* 1.40*  1.48*  --  1.41* 1.34*  MG  --   --  2.1 2.1  --   PHOS 2.5 3.3  --  3.7  --   ALBUMIN 2.6* 2.3*  --   --   --    Estimated Creatinine Clearance: 56.9 mL/min (by C-G formula based on Cr of 1.34).  No results for input(s): GLUCAP in the last 72 hours. BMP Latest Ref Rng 04/03/2015 04/02/2015 04/01/2015  Glucose 65 - 99 mg/dL 956(O) 130(Q) 657(Q)  BUN 6 - 20 mg/dL Creatinine 0.44 - 1.00 mg/dL 4.69(G) 2.95(M) 8.41(L)  Sodium 135 - 145 mmol/L 142 144 140  Potassium 3.5 - 5.1 mmol/L 3.9 3.6 3.7  Chloride 101 - 111 mmol/L 112(H) 111 105  CO2 22 - 32 mmol/L Calcium 8.9 - 10.3 mg/dL 9.7 9.8 10.5(H)    Medications:  Scheduled:  . heparin  5,000 Units Subcutaneous 3 times per day  . levofloxacin (LEVAQUIN) IV  500 mg Intravenous q1800  . lithium carbonate  300 mg Oral QHS  . nystatin cream  1 application Topical BID  . olopatadine  1 drop Both Eyes BID  . valproate sodium  750 mg Intravenous Q12H   Infusions:  . sodium chloride 125  mL/hr (04/03/15 0441)  . DOPamine Stopped (04/01/15 1300)    Assessment: Pharmacy consulted to correct electrolyte abnormalities in a 24 yo female admitted for lithium toxicity.  Patient underwent hemodialysis earlier this AM per poison control recommendations.  SCr improving but will need to follow closely.   Goal of Therapy:  Electrolytes within normal limits  Plan:  Electrolytes are wnl. Will recheck labs in AM on Monday 04/05/15.  Pharmacy will continue to follow.  Carola Frost, Pharm.D. Clinical Pharmacist 04/03/2015,6:15 AM

## 2015-04-03 NOTE — Clinical Social Work Note (Signed)
Clinical Social Work Assessment  Patient Details  Name: Hannah Keller MRN: [id number] Date of Birth: 03/04/1991  Date of referral:  04/03/15               Reason for consult:   (from Group Home)                Permission sought to share information with:  Facility Medical sales representative, Guardian Permission granted to share information::  Yes, Verbal Permission Granted  Name::     DSS Guardian Hannah Keller  [phone]  Agency::   ([facility]  Hannah [facility]   [phone]  )  Relationship::     Contact Information:     Housing/Transportation Living arrangements for the past 2 months:  Group Home Source of Information:  Facility Patient Interpreter Needed:  None Criminal Activity/Legal Involvement Pertinent to Current Situation/Hospitalization:  No - Comment as needed Significant Relationships:  [name], retail, [name] Lives with:  Facility Resident Do you feel safe going back to the place where you live?  Yes Need for family participation in patient care:  Yes (Comment) (patient has guardian)  Care giving concerns:  No concerns at reported   Social Worker assessment / plan:  Patient is MR 24 year old female.  Patient was sitting in recliner when CSW entered room. CSW introduces self, " I have another Hannah Keller".   CSW obtained information from patient's group home owner.  Patient has lived in current group home [facility] for the past 6 years.  At group home patient walks independently and likes to assist with daily chores.  Patient also attends a day program M-F.  Patient's guardian is with Hannah [facility] DSS Hannah [facility] [phone]).   Patient will return to group home once she is discharged from the [facility].    CSW will continue to follow patient for ongoing and disposition needs.  Completed FL2 and placed on chart in anticipation of patient returning to Group Home when medically stable.  Employment status:  Disabled (Comment on  whether or not currently receiving Disability) Insurance information:    PT Recommendations:  Not assessed at this time Information / Referral to community resources:   (none at this time)  Patient/Family's Response to care:  Patient wants to return to group home and owner wants her to return.   Patient/Family's Understanding of and Emotional Response to Diagnosis, Current Treatment, and Prognosis:  Group Home owner understands patient is under continued work up and return to group home when medically cleared.   Emotional Assessment Appearance:  Appears older than stated age Attitude/Demeanor/Rapport:    Affect (typically observed):  Agitated, Anxious, Irritable Orientation:  Oriented to Self, Oriented to Place Alcohol / Substance use:  Not Applicable Psych involvement (Current and /or in the community):  Yes (Comment), Outpatient Provider ([facility] [place], [place] (Dr. Jacki Cones [phone]).)  Discharge Needs  Concerns to be addressed:  No discharge needs identified Readmission within the last 30 days:  No Current discharge risk:  Chronically ill, Cognitively Impaired, Psychiatric Illness Barriers to Discharge:  Continued Medical Work up   Hannah Pilon, LCSW 04/03/2015, 4:10 PM Hannah [facility], MSW Clinical Social Work Department Emergency Room [phone] 4:15 PM

## 2015-04-03 NOTE — Progress Notes (Signed)
Patient was sleepy and lethargic this am, has become more awake as shift has progressed. Pt has been cooperative. Sat out of bed, attempted to walk, but was not coordinated enough to walk more than 6 ft , even with 2 assists. Pt has now begun to pull off leads and O2, ( x 2 in last hour) , reassured and comforted, helped back to bed at this time. Will continue  to closely observe

## 2015-04-03 NOTE — Progress Notes (Signed)
Patient: Hannah Keller / Admit Date: 03/29/2015 / Date of Encounter: 04/03/2015, 11:34 AM   Subjective: Lethargic, Unable to open eyes for any length of time, able to respond to questions, then seems to slip back to sleep.  No distress, indicated to caretaker she wants to go home. Caretaker of the facility by her side and reports her baseline is she wakes up at 9:30, awake for most of the day, occasional nap, helps with dishwasher, likes to dance --Contacted by nursing that she had a 11 second pause this morning. Review of telemetry strip as well as all of the pauses over the past 24 hours shows several episodes of 5 seconds, one episode of 10.5 seconds, 11 seconds Asymptomatic at the time.Marland Kitchen Possibly associated with hypoxia, oxygen nasal cannula has been placed.    Review of Systems: Review of Systems  Unable to perform ROS: mental acuity     Objective: Telemetry: NSR, 90s, 4.1 second pause this morning - asymptomatic (not associated with suctioning or sleep)  Physical Exam: Blood pressure 101/60, pulse 86, temperature 98.5 F (36.9 C), temperature source Axillary, resp. rate 20, height 5' (1.524 m), weight 156 lb 4.9 oz (70.9 kg), SpO2 99 %. Body mass index is 30.53 kg/(m^2). General: Well developed, well nourished, in no acute distress. Head: Normocephalic, atraumatic, sclera non-icteric, no xanthomas, nares are without discharge. Neck: Negative for carotid bruits. JVP not elevated. Lungs: Clear bilaterally to auscultation without wheezes, rales, or rhonchi. Breathing is unlabored. Heart: RRR S1 S2 without murmurs, rubs, or gallops.  Abdomen: Soft, non-tender, non-distended with normoactive bowel sounds. No rebound/guarding. Extremities: No clubbing or cyanosis. No edema. Distal pedal pulses are 2+ and equal bilaterally. Neuro: Alert and oriented X 3. Moves all extremities spontaneously. Psych:  Responds to questions appropriately with a normal affect.   Intake/Output Summary (Last  24 hours) at 04/03/15 1134 Last data filed at 04/03/15 0600  Gross per 24 hour  Intake   2950 ml  Output    553 ml  Net   2397 ml    Inpatient Medications:  . divalproex  750 mg Oral Q12H  . heparin  5,000 Units Subcutaneous 3 times per day  . levofloxacin (LEVAQUIN) IV  500 mg Intravenous q1800  . lithium carbonate  300 mg Oral QHS  . nystatin cream  1 application Topical BID  . olopatadine  1 drop Both Eyes BID   Infusions:  . DOPamine Stopped (04/01/15 1300)    Labs:  Recent Labs  04/01/15 0512 04/01/15 0940 04/02/15 0445 04/03/15 0109  NA 140  140  --  144 142  K 3.7  3.7  --  3.6 3.9  CL 105  106  --  111 112*  CO2 30  29  --  29 27  GLUCOSE 129*  129*  --  134* 112*  BUN 16  16  --  19 19  CREATININE 1.40*  1.48*  --  1.41* 1.34*  CALCIUM 10.5*  10.4*  --  9.8 9.7  MG  --  2.1 2.1  --   PHOS 3.3  --  3.7  --     Recent Labs  03/31/15 1444 04/01/15 0512  ALBUMIN 2.6* 2.3*    Recent Labs  03/31/15 1444 04/02/15 0445  WBC 16.4* 20.3*  HGB 10.4* 8.8*  HCT 31.6* 26.8*  MCV 94.1 95.6  PLT 87* 98*    Recent Labs  04/02/15 0921 04/02/15 1811 04/03/15 0109 04/03/15 1006  TROPONINI <0.03 <0.03 <0.03 <  0.03   Invalid input(s): POCBNP No results for input(s): HGBA1C in the last 72 hours.   Weights: Filed Weights   03/29/15 1454 03/31/15 1415  Weight: 170 lb (77.111 kg) 156 lb 4.9 oz (70.9 kg)     Radiology/Studies:  Dg Chest 2 View  03/29/2015     IMPRESSION: 1. Very limited low volume chest, cannot exclude aspiration pneumonitis. 2. Marked stool retention in the upper abdomen.   Electronically Signed   By: Marnee Spring M.D.   On: 03/29/2015 15:33   Mr Brain Wo Contrast  03/23/2015    IMPRESSION: 1. Stable noncontrast MRI appearance of the brain since 2014. 2. Mild-to-moderate paranasal sinus inflammation primarily at the right ethmoids. Mastoids and tympanic cavities are clear. 3. 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 If there are multiple cranial neuropathies consider cranial nerve entrapment such as in hyperostosis cranialis interna.   Electronically Signed   By: Odessa Fleming M.D.   On: 03/23/2015 14:03     Assessment and Plan   1. Arrhythmia/AV block Review of telemetry strips in detail shows 5 up to 10 second even 11 second pauses. Sinus rhythm marching through, sometimes bradycardic with sinus arrhythmia. ----Of note is the high degree AV block. Preceding and following these pauses typically with sinus tachycardia. --Monitor suggesting frequent periods of hypoxia,  ------ in an effort to resolve her hypoxia issues, nasal cannula oxygen has been placed. -----Certainly the hypoxia could be contributing to her pauses. We'll continue to monitor her rhythm and oxygenation Lithium level likely less of a concern ----Of PICC concern is her continued depressed mental status, inability to stay awake, likely contributing to her hypoxia   2. AMS/encephalopathy: -In the setting of lithium toxicity initially Now with improved lithium level, unclear if this is contributing to current status. Still with mental status changes, possibly contributing to hypoxia --We'll continue to monitor mental status -----Possible hypercapnic state during the period of lithium toxicity?  3. Lithium toxicity: -Improved s/p HD  4. Psychosocial concerns: -Psych on board . Lithium dose increased one month ago The recommendation was to restart lithium  5. ARF: -Per Renal  6. Leukocytosis: Etiology unclear, differential includes dental problem given the severity of her tooth decay, aspiration, or other etiology  7. anemia: -No obvious signs of bleed per RN Etiology unclear, nutritional? Iron deficient?   Signed: Dossie Arbour  M.D., Ph.D.

## 2015-04-03 NOTE — Progress Notes (Signed)
[facility]  ROUNDING NOTE   Subjective:  Patient continues to have some atrial pauses Lithium level of 0.8 Wbc 20.3 (16.4) Creatinine 1.34 (1.41)   Objective:  Vital signs in last 24 hours:  Temp:  [97.9 F (36.6 C)-98.9 F (37.2 C)] 98.5 F (36.9 C) (09/03 0800) Pulse Rate:  [78-105] 86 (09/03 0700) Resp:  [16-30] 20 (09/03 0800) BP: (87-129)/(43-107) 101/60 mmHg (09/03 0800) SpO2:  [94 %-100 %] 99 % (09/03 0700) FiO2 (%):  [2 %] 2 % (09/03 0600)  Weight change:  Filed Weights   03/29/15 1454 03/31/15 1415  Weight: 77.111 kg (170 lb) 70.9 kg (156 lb 4.9 oz)    Intake/Output: I/O last 3 completed shifts: In: 5325 [P.O.:660; I.V.:4500; IV Piggyback:165] Out: 1853 [Urine:1853]   Intake/Output this shift:     Physical Exam: General: NAD  Head: Normocephalic, atraumatic. Moist oral mucosal membranes  Eyes: Anicteric  Neck: Supple, trachea midline  Lungs:  Clear to auscultation normal effort  Heart: S1S2 no rubs  Abdomen:  Soft, nontender, BS present  Extremities: no peripheral edema.  Neurologic: Awake, answering simple questions, following commands  Skin: No lesions  Access: Right femoral temp HD catheter    Basic Metabolic Panel:  Recent Labs Lab 03/30/15 0536  03/31/15 0504 03/31/15 1444 04/01/15 0512 04/01/15 0940 04/02/15 0445 04/03/15 0109  NA 141  --  137 137 140  140  --  144 142  K 2.9*  < > 3.5 4.0 3.7  3.7  --  3.6 3.9  CL 106  --  105 105 105  106  --  111 112*  CO2 30  --  28 29 30  29   --  29 27  GLUCOSE 134*  --  121* 116* 129*  129*  --  134* 112*  BUN 29*  --  23* 19 16  16   --  19 19  CREATININE 1.72*  --  1.51* 1.18* 1.40*  1.48*  --  1.41* 1.34*  CALCIUM 9.0  --  9.9 9.8 10.5*  10.4*  --  9.8 9.7  MG 2.3  --  2.1  --   --  2.1 2.1  --   PHOS 3.6  --  3.2 2.5 3.3  --  3.7  --   < > = values in this interval not displayed.  Liver Function Tests:  Recent Labs Lab 03/29/15 1604 03/31/15 0504  03/31/15 1444 04/01/15 0512  AST 37 32  --   --   ALT 24 21  --   --   ALKPHOS 115 108  --   --   BILITOT 0.4 0.6  --   --   PROT 7.0 5.5*  --   --   ALBUMIN 3.3* 2.5* 2.6* 2.3*    Recent Labs Lab 03/29/15 1604  LIPASE 300*    Recent Labs Lab 03/31/15 2355  AMMONIA 40*    CBC:  Recent Labs Lab 03/29/15 1604 03/30/15 0536 03/31/15 0504 03/31/15 1444 04/02/15 0445  WBC 35.2* 29.7* 22.9* 16.4* 20.3*  HGB 11.4* 9.8* 10.3* 10.4* 8.8*  HCT 34.4* 29.9* 30.6* 31.6* 26.8*  MCV 96.7 95.0 94.3 94.1 95.6  PLT 129* 86* 84* 87* 98*    Cardiac Enzymes:  Recent Labs Lab 04/01/15 0940 04/02/15 0141 04/02/15 0921 04/02/15 1811 04/03/15 0109  TROPONINI <0.03 <0.03 <0.03 <0.03 <0.03    BNP: Invalid input(s): POCBNP  CBG:  Recent Labs Lab 03/29/15 2018  GLUCAP 76    Microbiology: Results for  orders placed or performed during the hospital encounter of 03/29/15  MRSA PCR Screening     Status: None   Collection Time: 03/29/15  3:53 PM  Result Value Ref Range Status   MRSA by PCR NEGATIVE NEGATIVE Final    Comment:        The GeneXpert MRSA Assay (FDA approved for NASAL specimens only), is one component of a comprehensive MRSA colonization surveillance program. It is not intended to diagnose MRSA infection nor to guide or monitor treatment for MRSA infections.     Coagulation Studies: No results for input(s): LABPROT, INR in the last 72 hours.  Urinalysis: No results for input(s): COLORURINE, LABSPEC, PHURINE, GLUCOSEU, HGBUR, BILIRUBINUR, KETONESUR, PROTEINUR, UROBILINOGEN, NITRITE, LEUKOCYTESUR in the last 72 hours.  Invalid input(s): APPERANCEUR    Imaging: No results found.   Medications:   . sodium chloride 250 mL/hr at 04/03/15 0800  . DOPamine Stopped (04/01/15 1300)   . heparin  5,000 Units Subcutaneous 3 times per day  . levofloxacin (LEVAQUIN) IV  500 mg Intravenous q1800  . lithium carbonate  300 mg Oral QHS  . nystatin cream  1  application Topical BID  . olopatadine  1 drop Both Eyes BID  . valproate sodium  750 mg Intravenous Q12H   sodium chloride, sodium chloride, alteplase, feeding supplement (NEPRO CARB STEADY), heparin, lidocaine (PF), lidocaine-prilocaine, LORazepam, pentafluoroprop-tetrafluoroeth  Assessment/ Plan:  24 y.o. female with a PMHx of mental retardation, paranoid schizophrenia, seizures, developmental delay, who was admitted to [facility] on 03/29/2015 for evaluation of ataxia, nausea, vomiting, and altered mental status.   1. Lithium toxicity R78.89 Lithium restarted, level of 0.8. Lithium and mood stabilizing agents as per psychiatry.   2. Acute renal failure/CKD stage II Acute renal failure on chronic kidney disease secondary to acute lithium toxicity. Suspect lithium exposure may be leading to chronic kidney disease.   3. Hypernatremia, resolved. Consistent with lithium toxicity giving diabetes insipidus picture. Now back to baseline Discontinue IV fluids. 1/2NS at 258mL/hr  4. Anemia and Thrombocytopenia: acute drop in hemoglobin, could be dilutional.  - repeat CBC today  5. Aspiration pneumonia? Wbc is climbing. Afebrile. Empiric levofloxacin.   Length of stay: 5  Hannah Keller 04/03/2015 9:05 AM

## 2015-04-03 NOTE — Progress Notes (Signed)
Patient pulled out IV , pulled leads off, IV restarted and arm board applied.  Awaiting MD to call back

## 2015-04-03 NOTE — Progress Notes (Signed)
Speech Language Pathology Treatment: Dysphagia  Patient Details Name: Hannah Keller MRN: [id number] DOB: Aug 28, 1990 Today's Date: 04/03/2015 Time: 4098-1191 SLP Time Calculation (min) (ACUTE ONLY): 56 min  Assessment / Plan / Recommendation Clinical Impression  Pt presents a min. Increased risk for aspiration sec. to oral and Esophageal phase swalloiwng deficits as well as declined Cognitive status for self-monitoring and judgement. Pt appeared to tolerate trials of thin liquids via cup w/ assistance to hold cup for drinking - delayed, mild coughing noted x1 when she slurped the drink from the cup. No other coughing noted when drinking from the cup taking her time and being more closely monitored and assisted w/ feeding. Pt's UEs are shaky and she does not monitor the amount taken in orally at times. Pt also had poor Esophageal motility which can impact the pharyngeal phase of swallowing and contribute to retrograde activity of bolus material moving into the pharynx and being aspirated. Pt does have a GI consult pending per NSG today. Caregiver of group home present during meal; she agreed pt would probably drink easier from her own drink cup at the home as well. ST will continue to f/u and monitor pt's status for s/s of dysphagia indicating need to downgrade diet to Nectar liquids. Caregiver agreed. NSG updated.     HPI Other Pertinent Information: Pt is a 24 y.o. female with a known history of mental retardation, schizophrenia, developmental delay, history of seizures, who presents to the hospital due to ataxic gait and altered mental status. Patient apparently was seen at the hospital a few days back for a swallow evaluation and was noted to have esophageal dysmotility. Patient has been eating and drinking well but has also been having some vomitus of partially digested food as per the caretaker. Patient resides at a group home and therefore most of the history obtained from the caretaker bedside. As per  the caretaker patient at baseline is able to walk without any ataxic gait and communicate with the well but over the past week she has been more lethargic and confused and has had imbalance issues. She has been referred to see a neurologist as an outpatient but has not been seen by anyone yet. Since his symptoms were not improving she came to the ER for further evaluation and was noted to have a significantly elevated lithium level at 4. As per the caretaker patient's lithium level was increased from 600 mg in the morning and 1200 mg at bedtime to 1200 mg twice daily. This happened about a month ago and patient has not had any follow-up lithium level since her dose was changed. During the MBSS on 8.22.16, pt presented w/ moderate-severe Esophageal dysmotility impacting the pharyngeal phase of swallowing and increasing airway compromise. During po trials, noted retrograde activity of bolus material proximal to the level of the UES then into the pharynx and the pyriform sinuses as po trials continued. By the end of the study, bolus material in the Esophagus filled the entire(viewable) Esophagus from just below the UES to the mid/ower Esophagus indicating poor emptying/motility - this presentation can increase risk for aspiration of Reflux material from retrograde activity. Pt has been NPO since admission sec. to lethargy and increased risk for aspiration; also noted moderate constipation issues per CXR - GI dysmotility(which can impact overall toleration of swallowing/oral diet).    Pertinent Vitals Pain Assessment: No/denies pain  SLP Plan  Continue with current plan of care    Recommendations Diet recommendations: Dysphagia 1 (puree);Thin liquid Liquids provided  via: Cup Medication Administration: Crushed with puree Supervision: Staff to assist with self feeding;Full supervision/cueing for compensatory strategies Compensations: Minimize environmental distractions;Slow rate;Small sips/bites;Check for  pocketing;Multiple dry swallows after each bite/sip Postural Changes and/or Swallow Maneuvers: Seated upright 90 degrees (Reflux precautions)              General recommendations:  (TBD) Oral Care Recommendations: Oral care BID;Oral care before and after PO;Staff/trained caregiver to provide oral care Follow up Recommendations:  (TBD) Plan: Continue with current plan of care    GO    Hannah Som, MS, CCC-SLP  Hannah Keller,Hannah Keller 04/03/2015, 1:02 PM

## 2015-04-03 NOTE — Progress Notes (Signed)
Patient had an 11.2 second pause on EKG , pt was sleepy in bed,  no change in mentation noted, BP stable,  Dr Mariah Milling notified

## 2015-04-03 NOTE — Progress Notes (Signed)
Patient found up out of bed, puilling all leads off, trying to pull IV out, escorted back to bed, leads re attached, armboard and kling wrap placed around IV site fro security. Md notified

## 2015-04-03 NOTE — Progress Notes (Signed)
Patient is having short periods where sats decrease to 79%- 85 %,on RA, pt denies SOB or any symptoms, pulse ox probe changed x 2 to improve waveform . Discussed with Dr Mariah Milling., to try 02 at 2 L and see if this effects volume/ length of sinus pauses

## 2015-04-03 NOTE — Progress Notes (Signed)
[facility] - [place] at [facility]                                                                                                                                                                                            Patient Demographics   Hannah Erdmann, is a 24 y.o. female, DOB - 12-23-1990, ZOX:[id number]  Admit date - 03/29/2015   Admitting Physician Houston Siren, MD  Outpatient Primary MD for the patient is Domenic Schwab, FNP   LOS - 5  Subjective: Seen today, patient had 11 second positive overnight. Asymptomatic at that time. Hemodynamically stable at that time. Review of Systems:   Due to mental retardation unable to provide histrory  Vitals:   Filed Vitals:   04/03/15 0552 04/03/15 0600 04/03/15 0700 04/03/15 0800  BP: 107/59 97/56 114/75 101/60  Pulse: 85 83 86   Temp:    98.5 F (36.9 C)  TempSrc:      Resp: 17 18 21 20   Height:      Weight:      SpO2: 94% 100% 99%     Wt Readings from Last 3 Encounters:  03/31/15 70.9 kg (156 lb 4.9 oz)  03/23/15 77.111 kg (170 lb)  12/13/14 77.565 kg (171 lb)     Intake/Output Summary (Last 24 hours) at 04/03/15 1138 Last data filed at 04/03/15 0600  Gross per 24 hour  Intake   2950 ml  Output    553 ml  Net   2397 ml    Physical Exam:   GENERAL: Critically ill-appearing  HEAD, EYES, EARS, NOSE AND THROAT: Atraumatic, normocephalic. . Pupils equal and reactive to light. Sclerae anicteric. No conjunctival injection. No oro-pharyngeal erythema.  NECK: Supple. There is no jugular venous distention. No bruits, no lymphadenopathy, no thyromegaly.  HEART: Regular rate and rhythm,. No murmurs, no rubs, no clicks.  LUNGS: Clear to auscultation bilaterally. No rales or rhonchi. No wheezes.  ABDOMEN: Soft, flat, nontender, nondistended. Has good bowel sounds. No hepatosplenomegaly appreciated.  EXTREMITIES: No evidence of any cyanosis, clubbing, or peripheral edema.  +2 pedal and radial  pulses bilaterally.  NEUROLOGIC: Confused and agitated SKIN: Moist and warm with no rashes appreciated.  Psych: Confused and agitated LN: No inguinal LN enlargement    Antibiotics   Anti-infectives    Start     Dose/Rate Route Frequency Ordered Stop   03/30/15 1330  levofloxacin (LEVAQUIN) IVPB 500 mg     500 mg 100 mL/hr over 60 Minutes Intravenous Daily-1800 03/30/15 1240     03/29/15 1950  valACYclovir (VALTREX) tablet 2,000 mg  Status:  Discontinued  2,000 mg Oral 2 times daily PRN 03/29/15 1952 03/31/15 1307      Medications   Scheduled Meds: . divalproex  750 mg Oral Q12H  . heparin  5,000 Units Subcutaneous 3 times per day  . levofloxacin (LEVAQUIN) IV  500 mg Intravenous q1800  . lithium carbonate  300 mg Oral QHS  . nystatin cream  1 application Topical BID  . olopatadine  1 drop Both Eyes BID   Continuous Infusions: . DOPamine Stopped (04/01/15 1300)   PRN Meds:.alteplase, feeding supplement (NEPRO CARB STEADY), heparin, lidocaine (PF), lidocaine-prilocaine, LORazepam, pentafluoroprop-tetrafluoroeth   Data Review:   Micro Results Recent Results (from the past 240 hour(s))  MRSA PCR Screening     Status: None   Collection Time: 03/29/15  3:53 PM  Result Value Ref Range Status   MRSA by PCR NEGATIVE NEGATIVE Final    Comment:        The GeneXpert MRSA Assay (FDA approved for NASAL specimens only), is one component of a comprehensive MRSA colonization surveillance program. It is not intended to diagnose MRSA infection nor to guide or monitor treatment for MRSA infections.     Radiology Reports Dg Chest 2 View  03/29/2015   CLINICAL DATA:  Congestion after vomiting.  EXAM: CHEST  2 VIEW  COMPARISON:  None.  FINDINGS: Marked hypoventilation, limiting sensitivity. No gross asymmetry in aeration. No effusion or air leak. Normal heart size and mediastinal contours.  Stool dilated colon visualized in the upper abdomen.  IMPRESSION: 1. Very limited low  volume chest, cannot exclude aspiration pneumonitis. 2. Marked stool retention in the upper abdomen.   Electronically Signed   By: Marnee Spring M.D.   On: 03/29/2015 15:33   Mr Brain Wo Contrast  03/23/2015   CLINICAL DATA:  24 year old female recently diagnosed with ear infection status post antibiotics but continued ataxia, unsteady gait. Initial encounter.  EXAM: MRI HEAD WITHOUT CONTRAST  TECHNIQUE: Multiplanar, multiecho pulse sequences of the brain and surrounding structures were obtained without intravenous contrast.  COMPARISON:   [facility] MRI 12/31/2012  FINDINGS: Rapid scanning technique employed.  Chronic widespread hyperostosis of the skull. Stable cerebral volume. Major intracranial vascular flow voids appear stable. No restricted diffusion or evidence of acute infarction. No ventriculomegaly (stable mild colpocephaly). No midline shift or intracranial mass lesion. No acute or chronic intracranial blood products identified. No encephalomalacia identified. Wallace Cullens and white matter signal appears stable. Negative pituitary and cervicomedullary junction. Stable visualized cervical spine.  There is new right ethmoid air cell fluid/opacification. Mild bilateral maxillary sinus mucosal thickening is new. Other Visualized paranasal sinuses and mastoids are clear. No tympanic cavity fluid. Chronic diminutive appearance of the IAC.  Orbits soft tissues appear stable and within normal limits. Scalp soft tissues appear normal.  IMPRESSION: 1. Stable noncontrast MRI appearance of the brain since 2014. 2. Mild-to-moderate paranasal sinus inflammation primarily at the right ethmoids. Mastoids and tympanic cavities are clear. 3. 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 If there are multiple cranial neuropathies consider cranial nerve entrapment such as in hyperostosis cranialis interna.   Electronically Signed   By: Odessa Fleming M.D.   On: 03/23/2015 14:03     CBC  Recent  Labs Lab 03/29/15 1604 03/30/15 0536 03/31/15 0504 03/31/15 1444 04/02/15 0445  WBC 35.2* 29.7* 22.9* 16.4* 20.3*  HGB 11.4* 9.8* 10.3* 10.4* 8.8*  HCT 34.4* 29.9* 30.6* 31.6* 26.8*  PLT 129* 86* 84* 87* 98*  MCV 96.7 95.0 94.3 94.1 95.6  MCH  32.0 31.1 31.9 30.9 31.5  MCHC 33.1 32.7 33.8 32.9 32.9  RDW 14.1 13.8 13.5 13.3 13.8    Chemistries   Recent Labs Lab 03/29/15 1604 03/30/15 0536  03/31/15 0504 03/31/15 1444 04/01/15 0512 04/01/15 0940 04/02/15 0445 04/03/15 0109  NA 148* 141  --  137 137 140  140  --  144 142  K 3.5 2.9*  < > 3.5 4.0 3.7  3.7  --  3.6 3.9  CL 116* 106  --  105 105 105  106  --  111 112*  CO2 28 30  --  28 29 30  29   --  29 27  GLUCOSE 101* 134*  --  121* 116* 129*  129*  --  134* 112*  BUN 42* 29*  --  23* 19 16  16   --  19 19  CREATININE 2.45* 1.72*  --  1.51* 1.18* 1.40*  1.48*  --  1.41* 1.34*  CALCIUM 11.3* 9.0  --  9.9 9.8 10.5*  10.4*  --  9.8 9.7  MG  --  2.3  --  2.1  --   --  2.1 2.1  --   AST 37  --   --  32  --   --   --   --   --   ALT 24  --   --  21  --   --   --   --   --   ALKPHOS 115  --   --  108  --   --   --   --   --   BILITOT 0.4  --   --  0.6  --   --   --   --   --   < > = values in this interval not displayed. ------------------------------------------------------------------------------------------------------------------ estimated creatinine clearance is 56.9 mL/min (by C-G formula based on Cr of 1.34). ------------------------------------------------------------------------------------------------------------------ No results for input(s): HGBA1C in the last 72 hours. ------------------------------------------------------------------------------------------------------------------ No results for input(s): CHOL, HDL, LDLCALC, TRIG, CHOLHDL, LDLDIRECT in the last 72 hours. ------------------------------------------------------------------------------------------------------------------  Recent Labs   04/02/15 0141  TSH 2.094   ------------------------------------------------------------------------------------------------------------------ No results for input(s): VITAMINB12, FOLATE, FERRITIN, TIBC, IRON, RETICCTPCT in the last 72 hours.  Coagulation profile No results for input(s): INR, PROTIME in the last 168 hours.  No results for input(s): DDIMER in the last 72 hours.  Cardiac Enzymes  Recent Labs Lab 04/02/15 1811 04/03/15 0109 04/03/15 1006  TROPONINI <0.03 <0.03 <0.03   ------------------------------------------------------------------------------------------------------------------ Invalid input(s): POCBNP    Assessment & Plan   24 year old female with past medical history of mental retardation, paranoid schizophrenia, hypertension, history of seizures, who presents to the hospital due to ataxic gait, altered mental status and noted to have lithium toxicity.  #1 altered mental status/encephalopathy-this is likely metabolic in nature and related to lithium toxicity. Slowly improving,back to baseline.  #2 pauses  : Possibly related to her lithium toxicity,.even though lithium levels are normalized she'll still have pauses. Poison Control center was contacted last night. They did not have any further recommendations. Monitor in ICU regarding this,pt gets hypoxic during this pauses,continue o2,check chest xray, #3 lithium toxicity-this is likely the setting of patient's increasing dose and also probably underlying acute renal failure. Status post hemodialysis with resolution, seen by nephrology, patient lithium levels are down, no further indication for dialysis at this time. Patient is followed by psychiatrically Dr. Toni Amend regarding lithium use in future.  #4ataxia-this is likely related to the patient's lithium toxicity.  OOB to chair today,PT eval  #5 acute renal failure-this is likely in setting of dehydration and poor by mouth intake. Patient's renal function is  stable creatinine possibly falsely low due to hemodialysis   #5 leukocytosis- trending down no real source of infection identified   #6 hypertension blood pressure meds on hold for now on dopamine for low blood per  #7 history of seizures-no evidence of acute seizure type activity. Changed to Depakote PO. #8 severe hypok repalced  #9 severe constipation on  Bowel regimn Nutrition: Seen by speech,tolerating dysphagia diet,d/c iv fluids. Spoke with group home director     Code Status Orders        Start     Ordered   03/29/15 1951  Full code   Continuous     03/29/15 1952           Consults  nephrology     Lab Results  Component Value Date   PLT 98* 04/02/2015     Time Spent in minutes   of critical care    Kemond Amorin M.D on 04/03/2015 at 11:38 AM  Between 7am to 6pm - Pager - [phone]  After 6pm go to [email or web address] - password [email or web address] [facility]  [facility] [place] [facility]   Office  [phone]

## 2015-04-03 NOTE — Progress Notes (Signed)
Md paged again x 3, Group home called to ask if they can bring her doll that she normally sleeps with/and or come back and stay with her. New orders noted from Dr Mitzi Hansen

## 2015-04-04 DIAGNOSIS — F79 Unspecified intellectual disabilities: Secondary | ICD-10-CM

## 2015-04-04 DIAGNOSIS — G4731 Primary central sleep apnea: Secondary | ICD-10-CM

## 2015-04-04 DIAGNOSIS — R451 Restlessness and agitation: Secondary | ICD-10-CM

## 2015-04-04 LAB — TROPONIN I
Troponin I: <0.03 ng/mL (ref <0.031)
Troponin I: <0.03 ng/mL (ref <0.031)

## 2015-04-04 NOTE — Progress Notes (Signed)
[facility] - [place] at [facility]                                                                                                                                                                                            Patient Demographics   Hannah Keller, is a 24 y.o. female, DOB - 03-Aug-1990, ZOX:[id number]  Admit date - 03/29/2015   Admitting Physician Houston Siren, MD  Outpatient Primary MD for the patient is Domenic Schwab, FNP   LOS - 6  Subjective; agitated  yesterday evening. Having the longer pauses when she is hypoxic. Oxygen helps with pauses.  Due to mental retardation unable to provide histrory  Vitals:   Filed Vitals:   04/04/15 0400 04/04/15 0500 04/04/15 0600 04/04/15 0700  BP: 94/81 116/57 108/56 117/60  Pulse:    84  Temp:    98.6 F (37 C)  TempSrc:      Resp: 33 25  19  Height:      Weight:      SpO2:   96% 99%    Wt Readings from Last 3 Encounters:  03/31/15 70.9 kg (156 lb 4.9 oz)  03/23/15 77.111 kg (170 lb)  12/13/14 77.565 kg (171 lb)     Intake/Output Summary (Last 24 hours) at 04/04/15 1123 Last data filed at 04/04/15 0100  Gross per 24 hour  Intake    900 ml  Output      0 ml  Net    900 ml    Physical Exam:   GENERAL: Oriented and says she wants to go home HEAD, EYES, EARS, NOSE AND THROAT: Atraumatic, normocephalic. . Pupils equal and reactive to light. Sclerae anicteric. No conjunctival injection. No oro-pharyngeal erythema.  NECK: Supple. There is no jugular venous distention. No bruits, no lymphadenopathy, no thyromegaly.  HEART: Regular rate and rhythm,. No murmurs, no rubs, no clicks.  LUNGS: Clear to auscultation bilaterally. No rales or rhonchi. No wheezes.  ABDOMEN: Soft, flat, nontender, nondistended. Has good bowel sounds. No hepatosplenomegaly appreciated.  EXTREMITIES: No evidence of any cyanosis, clubbing, or peripheral edema.  +2 pedal and radial pulses bilaterally.  NEUROLOGIC:  Confused and agitated SKIN: Moist and warm with no rashes appreciated.  Psych: Confused and agitated LN: No inguinal LN enlargement    Antibiotics   Anti-infectives    Start     Dose/Rate Route Frequency Ordered Stop   03/30/15 1330  levofloxacin (LEVAQUIN) IVPB 500 mg     500 mg 100 mL/hr over 60 Minutes Intravenous Daily-1800 03/30/15 1240     03/29/15 1950  valACYclovir (VALTREX) tablet 2,000 mg  Status:  Discontinued     2,000 mg Oral 2 times daily PRN 03/29/15 1952 03/31/15 1307      Medications   Scheduled Meds: . ARIPiprazole  15 mg Oral Daily  . divalproex  750 mg Oral Q12H  . heparin  5,000 Units Subcutaneous 3 times per day  . levofloxacin (LEVAQUIN) IV  500 mg Intravenous q1800  . lithium carbonate  300 mg Oral QHS  . nystatin cream  1 application Topical BID  . olopatadine  1 drop Both Eyes BID   Continuous Infusions: . DOPamine Stopped (04/01/15 1300)   PRN Meds:.alteplase, clonazePAM, feeding supplement (NEPRO CARB STEADY), heparin, lidocaine (PF), lidocaine-prilocaine, LORazepam, pentafluoroprop-tetrafluoroeth   Data Review:   Micro Results Recent Results (from the past 240 hour(s))  MRSA PCR Screening     Status: None   Collection Time: 03/29/15  3:53 PM  Result Value Ref Range Status   MRSA by PCR NEGATIVE NEGATIVE Final    Comment:        The GeneXpert MRSA Assay (FDA approved for NASAL specimens only), is one component of a comprehensive MRSA colonization surveillance program. It is not intended to diagnose MRSA infection nor to guide or monitor treatment for MRSA infections.     Radiology Reports Dg Chest 1 View  04/03/2015   CLINICAL DATA:  No developmental delays. Congestion after vomiting. Hypoxia.  EXAM: CHEST  1 VIEW  COMPARISON:  03/29/2015  FINDINGS: Shallow lung inflation. There is mild patchy density in the medial right lung base raising the question of developing infiltrate. The left lung is clear. No pulmonary edema. Visualized  bowel gas pattern is nonobstructive.  IMPRESSION: 1. Shallow inflation. 2. Suspect developing infiltrate in the right lower lobe.   Electronically Signed   By: Norva Pavlov M.D.   On: 04/03/2015 12:49   Dg Chest 2 View  03/29/2015   CLINICAL DATA:  Congestion after vomiting.  EXAM: CHEST  2 VIEW  COMPARISON:  None.  FINDINGS: Marked hypoventilation, limiting sensitivity. No gross asymmetry in aeration. No effusion or air leak. Normal heart size and mediastinal contours.  Stool dilated colon visualized in the upper abdomen.  IMPRESSION: 1. Very limited low volume chest, cannot exclude aspiration pneumonitis. 2. Marked stool retention in the upper abdomen.   Electronically Signed   By: Marnee Spring M.D.   On: 03/29/2015 15:33   Mr Brain Wo Contrast  03/23/2015   CLINICAL DATA:  24 year old female recently diagnosed with ear infection status post antibiotics but continued ataxia, unsteady gait. Initial encounter.  EXAM: MRI HEAD WITHOUT CONTRAST  TECHNIQUE: Multiplanar, multiecho pulse sequences of the brain and surrounding structures were obtained without intravenous contrast.  COMPARISON:  [facility] Brain MRI 12/31/2012  FINDINGS: Rapid scanning technique employed.  Chronic widespread hyperostosis of the skull. Stable cerebral volume. Major intracranial vascular flow voids appear stable. No restricted diffusion or evidence of acute infarction. No ventriculomegaly (stable mild colpocephaly). No midline shift or intracranial mass lesion. No acute or chronic intracranial blood products identified. No encephalomalacia identified. Wallace Cullens and white matter signal appears stable. Negative pituitary and cervicomedullary junction. Stable visualized cervical spine.  There is new right ethmoid air cell fluid/opacification. Mild bilateral maxillary sinus mucosal thickening is new. Other Visualized paranasal sinuses and mastoids are clear. No tympanic cavity fluid. Chronic diminutive appearance of the IAC.   Orbits soft tissues appear stable and within normal limits. Scalp soft tissues appear normal.  IMPRESSION: 1. Stable noncontrast MRI appearance of the brain since 2014. 2.  Mild-to-moderate paranasal sinus inflammation primarily at the right ethmoids. Mastoids and tympanic cavities are clear. 3. 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 If there are multiple cranial neuropathies consider cranial nerve entrapment such as in hyperostosis cranialis interna.   Electronically Signed   By: Odessa Fleming M.D.   On: 03/23/2015 14:03     CBC  Recent Labs Lab 03/30/15 0536 03/31/15 0504 03/31/15 1444 04/02/15 0445 04/03/15 1006  WBC 29.7* 22.9* 16.4* 20.3* 17.0*  HGB 9.8* 10.3* 10.4* 8.8* 8.4*  HCT 29.9* 30.6* 31.6* 26.8* 25.5*  PLT 86* 84* 87* 98* 95*  MCV 95.0 94.3 94.1 95.6 95.7  MCH 31.1 31.9 30.9 31.5 31.7  MCHC 32.7 33.8 32.9 32.9 33.1  RDW 13.8 13.5 13.3 13.8 13.8    Chemistries   Recent Labs Lab 03/29/15 1604 03/30/15 0536  03/31/15 0504 03/31/15 1444 04/01/15 0512 04/01/15 0940 04/02/15 0445 04/03/15 0109  NA 148* 141  --  137 137 140  140  --  144 142  K 3.5 2.9*  < > 3.5 4.0 3.7  3.7  --  3.6 3.9  CL 116* 106  --  105 105 105  106  --  111 112*  CO2 28 30  --  28 29 30  29   --  29 27  GLUCOSE 101* 134*  --  121* 116* 129*  129*  --  134* 112*  BUN 42* 29*  --  23* 19 16  16   --  19 19  CREATININE 2.45* 1.72*  --  1.51* 1.18* 1.40*  1.48*  --  1.41* 1.34*  CALCIUM 11.3* 9.0  --  9.9 9.8 10.5*  10.4*  --  9.8 9.7  MG  --  2.3  --  2.1  --   --  2.1 2.1  --   AST 37  --   --  32  --   --   --   --   --   ALT 24  --   --  21  --   --   --   --   --   ALKPHOS 115  --   --  108  --   --   --   --   --   BILITOT 0.4  --   --  0.6  --   --   --   --   --   < > = values in this interval not displayed. ------------------------------------------------------------------------------------------------------------------ estimated creatinine clearance is  56.9 mL/min (by C-G formula based on Cr of 1.34). ------------------------------------------------------------------------------------------------------------------ No results for input(s): HGBA1C in the last 72 hours. ------------------------------------------------------------------------------------------------------------------ No results for input(s): CHOL, HDL, LDLCALC, TRIG, CHOLHDL, LDLDIRECT in the last 72 hours. ------------------------------------------------------------------------------------------------------------------  Recent Labs  04/02/15 0141  TSH 2.094   ------------------------------------------------------------------------------------------------------------------ No results for input(s): VITAMINB12, FOLATE, FERRITIN, TIBC, IRON, RETICCTPCT in the last 72 hours.  Coagulation profile No results for input(s): INR, PROTIME in the last 168 hours.  No results for input(s): DDIMER in the last 72 hours.  Cardiac Enzymes  Recent Labs Lab 04/03/15 1723 04/04/15 0116 04/04/15 0908  TROPONINI 0.03 <0.03 <0.03   ------------------------------------------------------------------------------------------------------------------ Invalid input(s): POCBNP    Assessment & Plan   24 year old female with past medical history of mental retardation, paranoid schizophrenia, hypertension, history of seizures, who presents to the hospital due to ataxic gait, altered mental status and noted to have lithium toxicity.  #1 altered mental status/encephalopathy-this is likely metabolic in nature and related to lithium toxicity.  Slowly improving,back to baseline.  #2 pauses  : Likely secondary to underlying sleep apnea. Patient needs oxygen. Discuss with cardiology, pulmonary. His oxygen at home. Possibly need outpatient Holter monitor.  #3 lithium toxicity-this is likely the setting of patient's increasing dose and also probably underlying acute renal failure. Status post  hemodialysis with resolution, seen by nephrology, patient lithium levels are down, no further indication for dialysis at this time.     #4ataxia-this is likely related to the patient's lithium toxicity. OOB to chair today,PT eval  #5 acute renal failure-this is likely in setting of dehydration and poor by mouth intake. Patient's renal function is stable creatinine possibly falsely low due to hemodialysis   #5 leukocytosis- trending down no real source of infection identified   #6 hypertension blood pressure meds on hold for now on dopamine for low blood per  #7 history of seizures-no evidence of acute seizure type activity. Changed to Depakote PO. #8 severe hypok repalced  #9 severe constipation on  Bowel regimn Nutrition: Seen by speech,tolerating dysphagia diet,d/c iv fluids. Spoke with group home director   Pertinent history of schizophrenia, mental retardation. Started  on lithium by psychiatry  10. right lower lobe pneumonia: Continue oxygen, Levaquin. WBC is down.    Code Status Orders        Start     Ordered   03/29/15 1951  Full code   Continuous     03/29/15 1952           Consults  nephrology     Lab Results  Component Value Date   PLT 95* 04/03/2015     Time Spent in minutes   of critical care    Amonie Wisser M.D on 04/04/2015 at 11:23 AM  Between 7am to 6pm - Pager - [phone]  After 6pm go to [email or web address] - password [email or web address] [facility]  [facility]   Office  [phone]

## 2015-04-04 NOTE — Progress Notes (Addendum)
Patient: Hannah Keller / Admit Date: 03/29/2015 / Date of Encounter: 04/04/2015, 10:02 AM   Subjective: On oxygen nasal canula yesterday during the day with significantly improved pauses (2 to 3 sec only, no long pauses).  She had behavior issues yesterday afternoon and evening, needed geodon for sedation. Longer pauses this AM off oxygen while sleeping. Breathing pattern suggestive of sleep apnea. Case reviewed with pulmonary: suggests central/obstructive sleep apnea mix.   Review of Systems: Review of Systems  Unable to perform ROS poor historian  Objective: Telemetry: NSR with periods of sinus tachycardia, 2 to 3 sec pauses  Physical Exam: Blood pressure 117/60, pulse 84, temperature 98.6 F (37 C), temperature source Axillary, resp. rate 19, height 5' (1.524 m), weight 156 lb 4.9 oz (70.9 kg), SpO2 99 %. Body mass index is 30.53 kg/(m^2).  Sleeping pattern suggestive of sleep apnea General: Well developed, well nourished, in no acute distress.Very sleepy this AM.  Head: Normocephalic, atraumatic, sclera non-icteric, no xanthomas, nares are without discharge. Neck: Negative for carotid bruits. JVP not elevated. Lungs: Clear bilaterally to auscultation without wheezes, rales, or rhonchi. Breathing is unlabored. Heart: RRR S1 S2 without murmurs, rubs, or gallops. ectopy Abdomen: Soft, non-tender, non-distended with normoactive bowel sounds. No rebound/guarding. Extremities: No clubbing or cyanosis. No edema.  Neuro: \Very lethargic. Moves all extremities spontaneously. Psych:  Minimal response this AM, sleeping   Intake/Output Summary (Last 24 hours) at 04/04/15 1002 Last data filed at 04/04/15 0100  Gross per 24 hour  Intake    900 ml  Output      0 ml  Net    900 ml    Inpatient Medications:  . ARIPiprazole  15 mg Oral Daily  . divalproex  750 mg Oral Q12H  . heparin  5,000 Units Subcutaneous 3 times per day  . levofloxacin (LEVAQUIN) IV  500 mg Intravenous q1800  .  lithium carbonate  300 mg Oral QHS  . nystatin cream  1 application Topical BID  . olopatadine  1 drop Both Eyes BID   Infusions:  . DOPamine Stopped (04/01/15 1300)    Labs:  Recent Labs  04/02/15 0445 04/03/15 0109  NA 144 142  K 3.6 3.9  CL 111 112*  CO2 29 27  GLUCOSE 134* 112*  BUN 19 19  CREATININE 1.41* 1.34*  CALCIUM 9.8 9.7  MG 2.1  --   PHOS 3.7  --    No results for input(s): AST, ALT, ALKPHOS, BILITOT, PROT, ALBUMIN in the last 72 hours.  Recent Labs  04/02/15 0445 04/03/15 1006  WBC 20.3* 17.0*  HGB 8.8* 8.4*  HCT 26.8* 25.5*  MCV 95.6 95.7  PLT 98* 95*    Recent Labs  04/03/15 1006 04/03/15 1723 04/04/15 0116 04/04/15 0908  TROPONINI <0.03 0.03 <0.03 <0.03   Invalid input(s): POCBNP No results for input(s): HGBA1C in the last 72 hours.   Weights: Filed Weights   03/29/15 1454 03/31/15 1415  Weight: 170 lb (77.111 kg) 156 lb 4.9 oz (70.9 kg)     Radiology/Studies:  Dg Chest 1 View  04/03/2015   CLINICAL DATA:  No developmental delays. Congestion after vomiting. Hypoxia.  EXAM: CHEST  1 VIEW  COMPARISON:  03/29/2015  FINDINGS: Shallow lung inflation. There is mild patchy density in the medial right lung base raising the question of developing infiltrate. The left lung is clear. No pulmonary edema. Visualized bowel gas pattern is nonobstructive.  IMPRESSION: 1. Shallow inflation. 2. Suspect developing infiltrate in the  right lower lobe.   Electronically Signed   By: Norva Pavlov M.D.   On: 04/03/2015 12:49   Dg Chest 2 View  03/29/2015   CLINICAL DATA:  Congestion after vomiting.  EXAM: CHEST  2 VIEW  COMPARISON:  None.  FINDINGS: Marked hypoventilation, limiting sensitivity. No gross asymmetry in aeration. No effusion or air leak. Normal heart size and mediastinal contours.  Stool dilated colon visualized in the upper abdomen.  IMPRESSION: 1. Very limited low volume chest, cannot exclude aspiration pneumonitis. 2. Marked stool retention in  the upper abdomen.   Electronically Signed   By: Marnee Spring M.D.   On: 03/29/2015 15:33   Mr Brain Wo Contrast  03/23/2015   CLINICAL DATA:  24 year old female recently diagnosed with ear infection status post antibiotics but continued ataxia, unsteady gait. Initial encounter.  EXAM: MRI HEAD WITHOUT CONTRAST  TECHNIQUE: Multiplanar, multiecho pulse sequences of the brain and surrounding structures were obtained without intravenous contrast.  COMPARISON:  [facility] Brain MRI 12/31/2012  FINDINGS: Rapid scanning technique employed.  Chronic widespread hyperostosis of the skull. Stable cerebral volume. Major intracranial vascular flow voids appear stable. No restricted diffusion or evidence of acute infarction. No ventriculomegaly (stable mild colpocephaly). No midline shift or intracranial mass lesion. No acute or chronic intracranial blood products identified. No encephalomalacia identified. Wallace Cullens and white matter signal appears stable. Negative pituitary and cervicomedullary junction. Stable visualized cervical spine.  There is new right ethmoid air cell fluid/opacification. Mild bilateral maxillary sinus mucosal thickening is new. Other Visualized paranasal sinuses and mastoids are clear. No tympanic cavity fluid. Chronic diminutive appearance of the IAC.  Orbits soft tissues appear stable and within normal limits. Scalp soft tissues appear normal.  IMPRESSION: 1. Stable noncontrast MRI appearance of the brain since 2014. 2. Mild-to-moderate paranasal sinus inflammation primarily at the right ethmoids. Mastoids and tympanic cavities are clear. 3. 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 If there are multiple cranial neuropathies consider cranial nerve entrapment such as in hyperostosis cranialis interna.   Electronically Signed   By: Odessa Fleming M.D.   On: 03/23/2015 14:03     Assessment and Plan   1. Arrhythmia/AV block Improved pauses on oxygen yesterday, Longer  Pauses this AM off oxygen No symptoms with any of the pauses  high degree AV block. Preceding and following these pauses typically with sinus tachycardia. ----Would recommend nasal canula oxygen when sleeping and nappuing. Will need oxygen generator at the home and encouragement to wear. --If she is tolerating home oxygen, we could arrange 48 hour holter while on oxygen. --Ideally would perform an outpt sleep study. I do not think she would tolerate this. Perhaps home testing could be arranged?   2. AMS/encephalopathy: -In the setting of lithium toxicity initially Now with improved lithium level,   mental status back to baseline yesterday, outbursts, agitation, wanted to go home, required geodon.  3. Lithium toxicity: -Improved s/p HD  4. Psychosocial concerns: -Psych on board . Lithium dose increased one month ago The recommendation was to restart lithium at low dose  5. ARF: -Per Renal  6. Leukocytosis: Etiology unclear, differential includes dental problem given the severity of her tooth decay, aspiration, or other etiology Trending down --Need to promote tooth care at group home  7. anemia: -No obvious signs of bleed per RN Etiology unclear, nutritional? Iron deficient?  Signed, Dossie Arbour 04/04/2015, 10:02 AM

## 2015-04-04 NOTE — Progress Notes (Signed)
[facility]  ROUNDING NOTE   Subjective:   Patient continues to have atrial pauses. Now oxygen Agitated overnight.   Objective:  Vital signs in last 24 hours:  Temp:  [96.1 F (35.6 C)-98.6 F (37 C)] 98.6 F (37 C) (09/04 0700) Pulse Rate:  [64-105] 84 (09/04 0700) Resp:  [15-33] 19 (09/04 0700) BP: (94-137)/(35-110) 117/60 mmHg (09/04 0700) SpO2:  [83 %-100 %] 99 % (09/04 0700)  Weight change:  Filed Weights   03/29/15 1454 03/31/15 1415  Weight: 77.111 kg (170 lb) 70.9 kg (156 lb 4.9 oz)    Intake/Output: I/O last 3 completed shifts: In: 2735 [P.O.:1560; I.V.:1125; IV Piggyback:50] Out: 3 [Urine:3]   Intake/Output this shift:     Physical Exam: General: NAD  Head: Normocephalic, atraumatic. Moist oral mucosal membranes  Eyes: Anicteric  Neck: Supple, trachea midline  Lungs:  Clear to auscultation normal effort  Heart: S1S2 no rubs  Abdomen:  Soft, nontender, BS present  Extremities: no peripheral edema.  Neurologic: Awake, answering simple questions, following commands  Skin: No lesions  Access: Right femoral temp HD catheter    Basic Metabolic Panel:  Recent Labs Lab 03/30/15 0536  03/31/15 0504 03/31/15 1444 04/01/15 0512 04/01/15 0940 04/02/15 0445 04/03/15 0109  NA 141  --  137 137 140  140  --  144 142  K 2.9*  < > 3.5 4.0 3.7  3.7  --  3.6 3.9  CL 106  --  105 105 105  106  --  111 112*  CO2 30  --  28 29 30  29   --  29 27  GLUCOSE 134*  --  121* 116* 129*  129*  --  134* 112*  BUN 29*  --  23* 19 16  16   --  19 19  CREATININE 1.72*  --  1.51* 1.18* 1.40*  1.48*  --  1.41* 1.34*  CALCIUM 9.0  --  9.9 9.8 10.5*  10.4*  --  9.8 9.7  MG 2.3  --  2.1  --   --  2.1 2.1  --   PHOS 3.6  --  3.2 2.5 3.3  --  3.7  --   < > = values in this interval not displayed.  Liver Function Tests:  Recent Labs Lab 03/29/15 1604 03/31/15 0504 03/31/15 1444 04/01/15 0512  AST 37 32  --   --   ALT 24 21  --   --   ALKPHOS 115 108   --   --   BILITOT 0.4 0.6  --   --   PROT 7.0 5.5*  --   --   ALBUMIN 3.3* 2.5* 2.6* 2.3*    Recent Labs Lab 03/29/15 1604  LIPASE 300*    Recent Labs Lab 03/31/15 2355  AMMONIA 40*    CBC:  Recent Labs Lab 03/30/15 0536 03/31/15 0504 03/31/15 1444 04/02/15 0445 04/03/15 1006  WBC 29.7* 22.9* 16.4* 20.3* 17.0*  HGB 9.8* 10.3* 10.4* 8.8* 8.4*  HCT 29.9* 30.6* 31.6* 26.8* 25.5*  MCV 95.0 94.3 94.1 95.6 95.7  PLT 86* 84* 87* 98* 95*    Cardiac Enzymes:  Recent Labs Lab 04/03/15 0109 04/03/15 1006 04/03/15 1723 04/04/15 0116 04/04/15 0908  TROPONINI <0.03 <0.03 0.03 <0.03 <0.03    BNP: Invalid input(s): POCBNP  CBG:  Recent Labs Lab 03/29/15 2018  GLUCAP 76    Microbiology: Results for orders placed or performed during the hospital encounter of 03/29/15  MRSA PCR Screening  Status: None   Collection Time: 03/29/15  3:53 PM  Result Value Ref Range Status   MRSA by PCR NEGATIVE NEGATIVE Final    Comment:        The GeneXpert MRSA Assay (FDA approved for NASAL specimens only), is one component of a comprehensive MRSA colonization surveillance program. It is not intended to diagnose MRSA infection nor to guide or monitor treatment for MRSA infections.     Coagulation Studies: No results for input(s): LABPROT, INR in the last 72 hours.  Urinalysis: No results for input(s): COLORURINE, LABSPEC, PHURINE, GLUCOSEU, HGBUR, BILIRUBINUR, KETONESUR, PROTEINUR, UROBILINOGEN, NITRITE, LEUKOCYTESUR in the last 72 hours.  Invalid input(s): APPERANCEUR    Imaging: Dg Chest 1 View  04/03/2015   CLINICAL DATA:  No developmental delays. Congestion after vomiting. Hypoxia.  EXAM: CHEST  1 VIEW  COMPARISON:  03/29/2015  FINDINGS: Shallow lung inflation. There is mild patchy density in the medial right lung base raising the question of developing infiltrate. The left lung is clear. No pulmonary edema. Visualized bowel gas pattern is nonobstructive.   IMPRESSION: 1. Shallow inflation. 2. Suspect developing infiltrate in the right lower lobe.   Electronically Signed   By: Norva Pavlov M.D.   On: 04/03/2015 12:49     Medications:   . DOPamine Stopped (04/01/15 1300)   . ARIPiprazole  15 mg Oral Daily  . divalproex  750 mg Oral Q12H  . heparin  5,000 Units Subcutaneous 3 times per day  . levofloxacin (LEVAQUIN) IV  500 mg Intravenous q1800  . lithium carbonate  300 mg Oral QHS  . nystatin cream  1 application Topical BID  . olopatadine  1 drop Both Eyes BID   alteplase, clonazePAM, feeding supplement (NEPRO CARB STEADY), heparin, lidocaine (PF), lidocaine-prilocaine, LORazepam, pentafluoroprop-tetrafluoroeth  Assessment/ Plan:  24 y.o. female with a PMHx of mental retardation, paranoid schizophrenia, seizures, developmental delay, who was admitted to [facility] on 03/29/2015 for evaluation of ataxia, nausea, vomiting, and altered mental status.   1. Lithium toxicity R78.89 Lithium restarted, follow up level of 0.8. Lithium and mood stabilizing agents as per psychiatry.  - Will need outpatient monitoring.   2. Acute renal failure/CKD stage II Acute renal failure on chronic kidney disease secondary to acute lithium toxicity. Suspect lithium exposure may be leading to chronic kidney disease. No labs today.   3. Hypernatremia, resolved. Consistent with lithium toxicity giving diabetes insipidus picture. Now back to baseline Discontinued IV fluids. Encourage PO intake  4. Anemia and Thrombocytopenia: acute drop in hemoglobin, could be dilutional.   5. Aspiration pneumonia? Wbc is climbing. Afebrile. Empiric levofloxacin. Left lower lobe infiltrate on CXR.   Length of stay: 6  Hannah Keller, [facility] 04/04/2015 10:01 AM

## 2015-04-05 DIAGNOSIS — D6489 Other specified anemias: Secondary | ICD-10-CM

## 2015-04-05 DIAGNOSIS — G4730 Sleep apnea, unspecified: Secondary | ICD-10-CM

## 2015-04-05 DIAGNOSIS — I469 Cardiac arrest, cause unspecified: Secondary | ICD-10-CM

## 2015-04-05 LAB — BASIC METABOLIC PANEL
ANION GAP: 3 — AB (ref 5–15)
BUN: 9 mg/dL (ref 6–20)
CHLORIDE: 111 mmol/L (ref 101–111)
CO2: 26 mmol/L (ref 22–32)
Calcium: 9.4 mg/dL (ref 8.9–10.3)
Creatinine, Ser: 1.11 mg/dL — ABNORMAL HIGH (ref 0.44–1.00)
Glucose, Bld: 135 mg/dL — ABNORMAL HIGH (ref 65–99)
POTASSIUM: 4 mmol/L (ref 3.5–5.1)
SODIUM: 140 mmol/L (ref 135–145)

## 2015-04-05 LAB — CBC
HCT: 22.6 % — ABNORMAL LOW (ref 35.0–47.0)
Hemoglobin: 7.4 g/dL — ABNORMAL LOW (ref 12.0–16.0)
MCH: 31.5 pg (ref 26.0–34.0)
MCHC: 32.8 g/dL (ref 32.0–36.0)
MCV: 95.8 fL (ref 80.0–100.0)
PLATELETS: 115 10*3/uL — AB (ref 150–440)
RBC: 2.36 MIL/uL — AB (ref 3.80–5.20)
RDW: 14 % (ref 11.5–14.5)
WBC: 11.6 10*3/uL — AB (ref 3.6–11.0)

## 2015-04-05 LAB — PHOSPHORUS: PHOSPHORUS: 3.1 mg/dL (ref 2.5–4.6)

## 2015-04-05 LAB — MAGNESIUM: MAGNESIUM: 1.7 mg/dL (ref 1.7–2.4)

## 2015-04-05 NOTE — Progress Notes (Signed)
[facility]  ROUNDING NOTE   Subjective:   Sitter at bedside. Creatinine 1.11  Objective:  Vital signs in last 24 hours:  Temp:  [97.9 F (36.6 C)-99.3 F (37.4 C)] 97.9 F (36.6 C) (09/05 0841) Pulse Rate:  [70-109] 109 (09/05 1000) Resp:  [13-25] 19 (09/05 1000) BP: (78-115)/(47-86) 108/82 mmHg (09/05 1000) SpO2:  [90 %-100 %] 100 % (09/05 1000)  Weight change:  Filed Weights   03/29/15 1454 03/31/15 1415  Weight: 77.111 kg (170 lb) 70.9 kg (156 lb 4.9 oz)    Intake/Output: I/O last 3 completed shifts: In: 1548.8 [P.O.:1140; I.V.:8.8; IV Piggyback:400] Out: 450 [Urine:450]   Intake/Output this shift:  Total I/O In: 360 [P.O.:360] Out: -   Physical Exam: General: NAD  Head: Normocephalic, atraumatic. Moist oral mucosal membranes  Eyes: Anicteric  Neck: Supple, trachea midline  Lungs:  Clear to auscultation normal effort  Heart: S1S2 no rubs  Abdomen:  Soft, nontender, BS present  Extremities: no peripheral edema.  Neurologic: Awake, answering simple questions, following commands  Skin: No lesions  Access: Right femoral temp HD catheter    Basic Metabolic Panel:  Recent Labs Lab 03/30/15 0536  03/31/15 0504 03/31/15 1444 04/01/15 0512 04/01/15 0940 04/02/15 0445 04/03/15 0109 04/05/15 0821  NA 141  --  137 137 140  140  --  144 142 140  K 2.9*  < > 3.5 4.0 3.7  3.7  --  3.6 3.9 4.0  CL 106  --  105 105 105  106  --  111 112* 111  CO2 30  --  --  GLUCOSE 134*  --  121* 116* 129*  129*  --  134* 112* 135*  BUN 29*  --  23* --  CREATININE 1.72*  --  1.51* 1.18* 1.40*  1.48*  --  1.41* 1.34* 1.11*  CALCIUM 9.0  --  9.9 9.8 10.5*  10.4*  --  9.8 9.7 9.4  MG 2.3  --  2.1  --   --  2.1 2.1  --  1.7  PHOS 3.6  --  3.2 2.5 3.3  --  3.7  --  3.1  < > = values in this interval not displayed.  Liver Function Tests:  Recent Labs Lab 03/29/15 1604 03/31/15 0504 03/31/15 1444  04/01/15 0512  AST 37 32  --   --   ALT 24 21  --   --   ALKPHOS 115 108  --   --   BILITOT 0.4 0.6  --   --   PROT 7.0 5.5*  --   --   ALBUMIN 3.3* 2.5* 2.6* 2.3*    Recent Labs Lab 03/29/15 1604  LIPASE 300*    Recent Labs Lab 03/31/15 2355  AMMONIA 40*    CBC:  Recent Labs Lab 03/31/15 0504 03/31/15 1444 04/02/15 0445 04/03/15 1006 04/05/15 0821  WBC 22.9* 16.4* 20.3* 17.0* 11.6*  HGB 10.3* 10.4* 8.8* 8.4* 7.4*  HCT 30.6* 31.6* 26.8* 25.5* 22.6*  MCV 94.3 94.1 95.6 95.7 95.8  PLT 84* 87* 98* 95* 115*    Cardiac Enzymes:  Recent Labs Lab 04/03/15 0109 04/03/15 1006 04/03/15 1723 04/04/15 0116 04/04/15 0908  TROPONINI <0.03 <0.03 0.03 <0.03 <0.03    BNP: Invalid input(s): POCBNP  CBG:  Recent Labs Lab 03/29/15 2018  GLUCAP 76    Microbiology: Results for orders placed or  performed during the hospital encounter of 03/29/15  MRSA PCR Screening     Status: None   Collection Time: 03/29/15  3:53 PM  Result Value Ref Range Status   MRSA by PCR NEGATIVE NEGATIVE Final    Comment:        The GeneXpert MRSA Assay (FDA approved for NASAL specimens only), is one component of a comprehensive MRSA colonization surveillance program. It is not intended to diagnose MRSA infection nor to guide or monitor treatment for MRSA infections.     Coagulation Studies: No results for input(s): LABPROT, INR in the last 72 hours.  Urinalysis: No results for input(s): COLORURINE, LABSPEC, PHURINE, GLUCOSEU, HGBUR, BILIRUBINUR, KETONESUR, PROTEINUR, UROBILINOGEN, NITRITE, LEUKOCYTESUR in the last 72 hours.  Invalid input(s): APPERANCEUR    Imaging: Dg Chest 1 View  04/03/2015   CLINICAL DATA:  No developmental delays. Congestion after vomiting. Hypoxia.  EXAM: CHEST  1 VIEW  COMPARISON:  03/29/2015  FINDINGS: Shallow lung inflation. There is mild patchy density in the medial right lung base raising the question of developing infiltrate. The left lung is  clear. No pulmonary edema. Visualized bowel gas pattern is nonobstructive.  IMPRESSION: 1. Shallow inflation. 2. Suspect developing infiltrate in the right lower lobe.   Electronically Signed   By: Norva Pavlov M.D.   On: 04/03/2015 12:49     Medications:   . DOPamine Stopped (04/01/15 1300)   . ARIPiprazole  15 mg Oral Daily  . divalproex  750 mg Oral Q12H  . heparin  5,000 Units Subcutaneous 3 times per day  . levofloxacin (LEVAQUIN) IV  500 mg Intravenous q1800  . lithium carbonate  300 mg Oral QHS  . nystatin cream  1 application Topical BID  . olopatadine  1 drop Both Eyes BID   alteplase, clonazePAM, feeding supplement (NEPRO CARB STEADY), heparin, lidocaine (PF), lidocaine-prilocaine, LORazepam, pentafluoroprop-tetrafluoroeth  Assessment/ Plan:  24 y.o. female with a PMHx of mental retardation, paranoid schizophrenia, seizures, developmental delay, who was admitted to [facility] on 03/29/2015 for evaluation of ataxia, nausea, vomiting, and altered mental status.   1. Lithium toxicity R78.89 Lithium restarted, follow up level of 0.8. Lithium and mood stabilizing agents as per psychiatry.  - Will need outpatient monitoring.   2. Acute renal failure/CKD stage II: creatinine now back at baseline Acute renal failure on chronic kidney disease secondary to acute lithium toxicity. Suspect lithium exposure may be leading to chronic kidney disease.   3. Hypernatremia, resolved. Na 140. Consistent with lithium toxicity giving diabetes insipidus picture. Now back to baseline Discontinued IV fluids. Encourage PO intake  4. Anemia and Thrombocytopenia: acute drop in hemoglobin, could be dilutional.   5. Aspiration pneumonia? Wbc is elevated. Afebrile. Empiric levofloxacin. Left lower lobe infiltrate on CXR.   Please call with questions. Will sign off.   Length of stay: 7  Avis Tirone, [facility] 04/05/2015 10:34 AM

## 2015-04-05 NOTE — Progress Notes (Signed)
Discussed case with Dr. Sherryl Manges, EP. Nocturnal heart block in the setting of sleep apnea most likely cause of her pauses. She would not likely tolerate a sleep study or sleep apnea, Oxygen this morning did not seem to help with her pauses, Will discontinue oxygen. Etiology likely central sleep apnea, very mild component of obstructive. We will transferred to the floor. No further cardiac workup at this time

## 2015-04-05 NOTE — Progress Notes (Signed)
Patient: Hannah Keller / Admit Date: 03/29/2015 / Date of Encounter: 04/05/2015, 10:18 AM   Subjective: No agitation overnight, Slept well, comfortable Long pauses again this Am during sleep (10 to 12 sec)   Review of Systems: Review of Systems  Constitutional: Negative.   Respiratory: Negative.   Cardiovascular: Negative.   Gastrointestinal: Negative.   Musculoskeletal: Negative.   Neurological: Negative.   All other systems reviewed and are negative. Poor historian, unable to answer questions well  Objective: Telemetry: NSR, periods of sinus tachycardia, pauses Physical Exam: Blood pressure 101/47, pulse 94, temperature 97.9 F (36.6 C), temperature source Oral, resp. rate 22, height 5' (1.524 m), weight 156 lb 4.9 oz (70.9 kg), SpO2 100 %. Body mass index is 30.53 kg/(m^2). General: Well developed, well nourished, in no acute distress. Head: Normocephalic, atraumatic Neck:  JVP not elevated. Lungs: Clear bilaterally to auscultation without wheezes, rales, or rhonchi. Breathing is unlabored. Heart: RRR S1 S2 without murmurs, rubs, or gallops.  Abdomen: Soft, non-tender, non-distended with normoactive bowel sounds. No rebound/guarding. Extremities: No clubbing or cyanosis. No edema. Distal pedal pulses are 2+ and equal bilaterally. Neuro: Alert and oriented X 3. Moves all extremities spontaneously. Psych:  Poor historian   Intake/Output Summary (Last 24 hours) at 04/05/15 1018 Last data filed at 04/05/15 0841  Gross per 24 hour  Intake 1368.82 ml  Output    450 ml  Net 918.82 ml    Inpatient Medications:  . ARIPiprazole  15 mg Oral Daily  . divalproex  750 mg Oral Q12H  . heparin  5,000 Units Subcutaneous 3 times per day  . levofloxacin (LEVAQUIN) IV  500 mg Intravenous q1800  . lithium carbonate  300 mg Oral QHS  . nystatin cream  1 application Topical BID  . olopatadine  1 drop Both Eyes BID   Infusions:  . DOPamine Stopped (04/01/15 1300)     Labs:  Recent Labs  04/03/15 0109 04/05/15 0821  NA 142 140  K 3.9 4.0  CL 112* 111  CO2 27 26  GLUCOSE 112* 135*  BUN 19 9  CREATININE 1.34* 1.11*  CALCIUM 9.7 9.4  MG  --  1.7  PHOS  --  3.1   No results for input(s): AST, ALT, ALKPHOS, BILITOT, PROT, ALBUMIN in the last 72 hours.  Recent Labs  04/03/15 1006 04/05/15 0821  WBC 17.0* 11.6*  HGB 8.4* 7.4*  HCT 25.5* 22.6*  MCV 95.7 95.8  PLT 95* 115*    Recent Labs  04/03/15 1006 04/03/15 1723 04/04/15 0116 04/04/15 0908  TROPONINI <0.03 0.03 <0.03 <0.03   Invalid input(s): POCBNP No results for input(s): HGBA1C in the last 72 hours.   Weights: Filed Weights   03/29/15 1454 03/31/15 1415  Weight: 170 lb (77.111 kg) 156 lb 4.9 oz (70.9 kg)     Radiology/Studies:  Dg Chest 1 View  04/03/2015   CLINICAL DATA:  No developmental delays. Congestion after vomiting. Hypoxia.  EXAM: CHEST  1 VIEW  COMPARISON:  03/29/2015  FINDINGS: Shallow lung inflation. There is mild patchy density in the medial right lung base raising the question of developing infiltrate. The left lung is clear. No pulmonary edema. Visualized bowel gas pattern is nonobstructive.  IMPRESSION: 1. Shallow inflation. 2. Suspect developing infiltrate in the right lower lobe.   Electronically Signed   By: Norva Pavlov M.D.   On: 04/03/2015 12:49   Dg Chest 2 View  03/29/2015   CLINICAL DATA:  Congestion after vomiting.  EXAM: CHEST  2 VIEW  COMPARISON:  None.  FINDINGS: Marked hypoventilation, limiting sensitivity. No gross asymmetry in aeration. No effusion or air leak. Normal heart size and mediastinal contours.  Stool dilated colon visualized in the upper abdomen.  IMPRESSION: 1. Very limited low volume chest, cannot exclude aspiration pneumonitis. 2. Marked stool retention in the upper abdomen.   Electronically Signed   By: Marnee Spring M.D.   On: 03/29/2015 15:33   Mr Brain Wo Contrast  03/23/2015   CLINICAL DATA:  .  IMPRESSION: 1.  Stable noncontrast MRI appearance of the brain since 2014. 2. Mild-to-moderate paranasal sinus inflammation primarily at the right ethmoids. Mastoids and tympanic cavities are clear. 3. 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 If there are multiple cranial neuropathies consider cranial nerve entrapment such as in hyperostosis cranialis interna.   Electronically Signed   By: Odessa Fleming M.D.   On: 03/23/2015 14:03     Assessment and Plan   1. Arrhythmia/AV block Improved pauses during the day on oxygen yesterday, Longer Pauses in AM, associated with sleeping, apnea No symptoms with any of the pauses high degree AV block. Preceding and following these pauses typically with sinus tachycardia. ----Continue nasal canula oxygen when sleeping  Will need oxygen generator at the home and encouragement to wear. --Will run this case past EP Graciela Husbands).  Ideally should have sleep study, be on CPAP if positive, though doubt she would permit or tolerate given behavior issue   2. AMS/encephalopathy: -In the setting of lithium toxicity initially Now with improved lithium level,  mental status back to baseline yesterday, outbursts, agitation, wanted to go home, required geodon 36 hours ago, Better since then  3. Lithium toxicity: -Improved s/p HD Restarted for behavior  4. Psychosocial concerns: -Psych on board . Lithium dose increased one month ago The recommendation was to restart lithium at low dose  5. ARF: -Per Renal  6. Leukocytosis: Etiology unclear, differential includes dental problem given the severity of her tooth decay, aspiration, or other etiology Trending down --Need to promote tooth care at group home  7. anemia: -No obvious signs of bleed  Etiology unclear, nutritional? Iron deficient? Continues to decline today, HCT 22   Signed, Dossie Arbour, MD [facility] 04/05/2015, 10:18 AM

## 2015-04-05 NOTE — Care Management Note (Signed)
Case Management Note  Patient Details  Name: Hannah Keller MRN: [id number] Date of Birth: September 05, 1990  Subjective/Objective:   Case discussed during ICU progression rounds. Possibility of discharge today. Home O2 needed due to sleep apnea. Spoke with [name] at [facility]. She can transport pt by [facility]ena[facility] Niece. Patient does not have home O2 currently. They are able to accommodate home O2. ICU RN checked qualifying O2 sats. PATIENT DID NOT HAVE QUALIFYING O2 SATS. Will not qualify for home O2. Updated Ms. Timmons on O2.                   Action/Plan: Home by Zena[facility] Niece when medically stable. Monica, CSW updated.   Expected Discharge Date:                  Expected Discharge Plan:  Group Home  In-House Referral:  Clinical Social Work  Discharge planning Services  CM Consult  Post Acute Care Choice:    Choice offered to:     DME Arranged:    DME Agency:     HH Arranged:    HH Agency:     Status of Service:  In process, will continue to follow  Medicare Important Message Given:    Date Medicare IM Given:    Medicare IM give by:    Date Additional Medicare IM Given:    Additional Medicare Important Message give by:     If discussed at Long Length of Stay Meetings, dates discussed:    Additional Comments:  Marily Memos, RN 04/05/2015, 11:07 AM

## 2015-04-05 NOTE — Progress Notes (Signed)
PHARMACY - CRITICAL CARE PROGRESS NOTE  Pharmacy Consult for Electrolyte Supplementation    Allergies  Allergen Reactions  . Ritalin [Methylphenidate Hcl] Other (See Comments)    Reaction:  Unknown     Patient Measurements: Height: 5' (152.4 cm) Weight: 156 lb 4.9 oz (70.9 kg) IBW/kg (Calculated) : 45.5   Vital Signs: Temp: 97.9 F (36.6 C) (09/05 0841) Temp Source: Oral (09/05 0841) BP: 101/47 mmHg (09/05 0700) Pulse Rate: 94 (09/05 0700) Intake/Output from previous day: 09/04 0701 - 09/05 0700 In: 1008.8 [P.O.:600; I.V.:8.8; IV Piggyback:400] Out: 450 [Urine:450] Intake/Output from this shift: Total I/O In: 360 [P.O.:360] Out: -      Labs:  Recent Labs  04/03/15 0109 04/03/15 1006 04/05/15 0821  WBC  --  17.0* 11.6*  HGB  --  8.4* 7.4*  HCT  --  25.5* 22.6*  PLT  --  95* 115*  CREATININE 1.34*  --  1.11*  MG  --   --  1.7  PHOS  --   --  3.1   Estimated Creatinine Clearance: 68.7 mL/min (by C-G formula based on Cr of 1.11).  No results for input(s): GLUCAP in the last 72 hours. BMP Latest Ref Rng 04/05/2015 04/03/2015 04/02/2015  Glucose 65 - 99 mg/dL 161(W) 960(A) 540(J)  BUN 6 - 20 mg/dL Creatinine 0.44 - 1.00 mg/dL 8.11(B) 1.47(W) 2.95(A)  Sodium 135 - 145 mmol/L 140 142 144  Potassium 3.5 - 5.1 mmol/L 4.0 3.9 3.6  Chloride 101 - 111 mmol/L 111 112(H) 111  CO2 22 - 32 mmol/L Calcium 8.9 - 10.3 mg/dL 9.4 9.7 9.8    Medications:  Scheduled:  . ARIPiprazole  15 mg Oral Daily  . divalproex  750 mg Oral Q12H  . heparin  5,000 Units Subcutaneous 3 times per day  . levofloxacin (LEVAQUIN) IV  500 mg Intravenous q1800  . lithium carbonate  300 mg Oral QHS  . nystatin cream  1 application Topical BID  . olopatadine  1 drop Both Eyes BID   Infusions:  . DOPamine Stopped (04/01/15 1300)    Assessment: Pharmacy consulted to correct electrolyte abnormalities in a 24 yo female admitted for lithium toxicity.   Goal of Therapy:   Electrolytes within normal limits  Plan:  Electrolytes are WNL.  Will recheck labs in AM.   Pharmacy will continue to follow.  Tarren Velardi K, Pharm.D. Clinical Pharmacist 04/05/2015,9:29 AM

## 2015-04-05 NOTE — Care Management Note (Signed)
Case Management Note  Patient Details  Name: Hannah Keller MRN: [id number] Date of Birth: 1991/05/16  Subjective/Objective:   Spoke with grp home. Patient will have to be discharged and picked up prior to 2P today due to they will have no transportation after 2 today. CSW updated                 Action/Plan:   Expected Discharge Date:                  Expected Discharge Plan:  Group Home  In-House Referral:  Clinical Social Work  Discharge planning Services  CM Consult  Post Acute Care Choice:    Choice offered to:     DME Arranged:    DME Agency:     HH Arranged:    HH Agency:     Status of Service:  In process, will continue to follow  Medicare Important Message Given:    Date Medicare IM Given:    Medicare IM give by:    Date Additional Medicare IM Given:    Additional Medicare Important Message give by:     If discussed at Long Length of Stay Meetings, dates discussed:    Additional Comments:  Marily Memos, RN 04/05/2015, 11:25 AM

## 2015-04-05 NOTE — Progress Notes (Signed)
[facility] [place] [facility]                                                                                                                                                                                            Patient Demographics   Hannah Keller, is a 24 y.o. female, DOB - 07-01-1991, ZOX:[id number]  Admit date - 03/29/2015   Admitting Physician Houston Siren, MD  Outpatient Primary MD for the patient is Domenic Schwab, FNP   LOS - 7  Subjective; seen today. The patient to had about 12 second pause this morning. Even with oxygen. Less agitated no other complaints. Vitals:   Filed Vitals:   04/05/15 0800 04/05/15 0841 04/05/15 0900 04/05/15 1000  BP: 94/50  110/56 108/82  Pulse: 94  85 109  Temp:  97.9 F (36.6 C)    TempSrc:  Oral    Resp: Height:      Weight:      SpO2: 99%  100% 100%    Wt Readings from Last 3 Encounters:  03/31/15 70.9 kg (156 lb 4.9 oz)  03/23/15 77.111 kg (170 lb)  12/13/14 77.565 kg (171 lb)     Intake/Output Summary (Last 24 hours) at 04/05/15 1158 Last data filed at 04/05/15 0841  Gross per 24 hour  Intake 1368.82 ml  Output    450 ml  Net 918.82 ml    Physical Exam:   GENERAL: Oriented and says she wants to go home HEAD, EYES, EARS, NOSE AND THROAT: Atraumatic, normocephalic. . Pupils equal and reactive to light. Sclerae anicteric. No conjunctival injection. No oro-pharyngeal erythema.  NECK: Supple. There is no jugular venous distention. No bruits, no lymphadenopathy, no thyromegaly.  HEART: Regular rate and rhythm,. No murmurs, no rubs, no clicks.  LUNGS: Clear to auscultation bilaterally. No rales or rhonchi. No wheezes.  ABDOMEN: Soft, flat, nontender, nondistended. Has good bowel sounds. No hepatosplenomegaly appreciated.  EXTREMITIES: No evidence of any cyanosis, clubbing, or peripheral edema.  +2 pedal and radial pulses bilaterally.  NEUROLOGIC: Confused and agitated SKIN:  Moist and warm with no rashes appreciated.  Psych: Confused and agitated LN: No inguinal LN enlargement    Antibiotics   Anti-infectives    Start     Dose/Rate Route Frequency Ordered Stop   03/30/15 1330  levofloxacin (LEVAQUIN) IVPB 500 mg     500 mg 100 mL/hr over 60 Minutes Intravenous Daily-1800 03/30/15 1240     03/29/15 1950  valACYclovir (VALTREX) tablet 2,000 mg  Status:  Discontinued     2,000 mg Oral 2 times daily PRN  03/29/15 1952 03/31/15 1307      Medications   Scheduled Meds: . ARIPiprazole  15 mg Oral Daily  . divalproex  750 mg Oral Q12H  . heparin  5,000 Units Subcutaneous 3 times per day  . levofloxacin (LEVAQUIN) IV  500 mg Intravenous q1800  . lithium carbonate  300 mg Oral QHS  . nystatin cream  1 application Topical BID  . olopatadine  1 drop Both Eyes BID   Continuous Infusions: . DOPamine Stopped (04/01/15 1300)   PRN Meds:.alteplase, clonazePAM, feeding supplement (NEPRO CARB STEADY), heparin, lidocaine (PF), lidocaine-prilocaine, LORazepam, pentafluoroprop-tetrafluoroeth   Data Review:   Micro Results Recent Results (from the past 240 hour(s))  MRSA PCR Screening     Status: None   Collection Time: 03/29/15  3:53 PM  Result Value Ref Range Status   MRSA by PCR NEGATIVE NEGATIVE Final    Comment:        The GeneXpert MRSA Assay (FDA approved for NASAL specimens only), is one component of a comprehensive MRSA colonization surveillance program. It is not intended to diagnose MRSA infection nor to guide or monitor treatment for MRSA infections.     Radiology Reports Dg Chest 1 View  04/03/2015   CLINICAL DATA:  No developmental delays. Congestion after vomiting. Hypoxia.  EXAM: CHEST  1 VIEW  COMPARISON:  03/29/2015  FINDINGS: Shallow lung inflation. There is mild patchy density in the medial right lung base raising the question of developing infiltrate. The left lung is clear. No pulmonary edema. Visualized bowel gas pattern is  nonobstructive.  IMPRESSION: 1. Shallow inflation. 2. Suspect developing infiltrate in the right lower lobe.   Electronically Signed   By: Norva Pavlov M.D.   On: 04/03/2015 12:49   Dg Chest 2 View  03/29/2015   CLINICAL DATA:  Congestion after vomiting.  EXAM: CHEST  2 VIEW  COMPARISON:  None.  FINDINGS: Marked hypoventilation, limiting sensitivity. No gross asymmetry in aeration. No effusion or air leak. Normal heart size and mediastinal contours.  Stool dilated colon visualized in the upper abdomen.  IMPRESSION: 1. Very limited low volume chest, cannot exclude aspiration pneumonitis. 2. Marked stool retention in the upper abdomen.   Electronically Signed   By: Marnee Spring M.D.   On: 03/29/2015 15:33   Mr Brain Wo Contrast  03/23/2015   CLINICAL DATA:  24 year old female recently diagnosed with ear infection status post antibiotics but continued ataxia, unsteady gait. Initial encounter.  EXAM: MRI HEAD WITHOUT CONTRAST  TECHNIQUE: Multiplanar, multiecho pulse sequences of the brain and surrounding structures were obtained without intravenous contrast.  COMPARISON:  [facility] Brain MRI 12/31/2012  FINDINGS: Rapid scanning technique employed.  Chronic widespread hyperostosis of the skull. Stable cerebral volume. Major intracranial vascular flow voids appear stable. No restricted diffusion or evidence of acute infarction. No ventriculomegaly (stable mild colpocephaly). No midline shift or intracranial mass lesion. No acute or chronic intracranial blood products identified. No encephalomalacia identified. Wallace Cullens and white matter signal appears stable. Negative pituitary and cervicomedullary junction. Stable visualized cervical spine.  There is new right ethmoid air cell fluid/opacification. Mild bilateral maxillary sinus mucosal thickening is new. Other Visualized paranasal sinuses and mastoids are clear. No tympanic cavity fluid. Chronic diminutive appearance of the IAC.  Orbits soft tissues  appear stable and within normal limits. Scalp soft tissues appear normal.  IMPRESSION: 1. Stable noncontrast MRI appearance of the brain since 2014. 2. Mild-to-moderate paranasal sinus inflammation primarily at the right ethmoids. Mastoids and tympanic cavities are  clear. 3. 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 If there are multiple cranial neuropathies consider cranial nerve entrapment such as in hyperostosis cranialis interna.   Electronically Signed   By: Odessa Fleming M.D.   On: 03/23/2015 14:03     CBC  Recent Labs Lab 03/31/15 0504 03/31/15 1444 04/02/15 0445 04/03/15 1006 04/05/15 0821  WBC 22.9* 16.4* 20.3* 17.0* 11.6*  HGB 10.3* 10.4* 8.8* 8.4* 7.4*  HCT 30.6* 31.6* 26.8* 25.5* 22.6*  PLT 84* 87* 98* 95* 115*  MCV 94.3 94.1 95.6 95.7 95.8  MCH 31.9 30.9 31.5 31.7 31.5  MCHC 33.8 32.9 32.9 33.1 32.8  RDW 13.5 13.3 13.8 13.8 14.0    Chemistries   Recent Labs Lab 03/29/15 1604 03/30/15 0536  03/31/15 0504 03/31/15 1444 04/01/15 0512 04/01/15 0940 04/02/15 0445 04/03/15 0109 04/05/15 0821  NA 148* 141  --  137 137 140  140  --  144 142 140  K 3.5 2.9*  < > 3.5 4.0 3.7  3.7  --  3.6 3.9 4.0  CL 116* 106  --  105 105 105  106  --  111 112* 111  CO2 28 30  --  28 29 30  29   --  29 27 26   GLUCOSE 101* 134*  --  121* 116* 129*  129*  --  134* 112* 135*  BUN 42* 29*  --  23* 19 16  16   --  19 19 9   CREATININE 2.45* 1.72*  --  1.51* 1.18* 1.40*  1.48*  --  1.41* 1.34* 1.11*  CALCIUM 11.3* 9.0  --  9.9 9.8 10.5*  10.4*  --  9.8 9.7 9.4  MG  --  2.3  --  2.1  --   --  2.1 2.1  --  1.7  AST 37  --   --  32  --   --   --   --   --   --   ALT 24  --   --  21  --   --   --   --   --   --   ALKPHOS 115  --   --  108  --   --   --   --   --   --   BILITOT 0.4  --   --  0.6  --   --   --   --   --   --   < > = values in this interval not  displayed. ------------------------------------------------------------------------------------------------------------------ estimated creatinine clearance is 68.7 mL/min (by C-G formula based on Cr of 1.11). ------------------------------------------------------------------------------------------------------------------ No results for input(s): HGBA1C in the last 72 hours. ------------------------------------------------------------------------------------------------------------------ No results for input(s): CHOL, HDL, LDLCALC, TRIG, CHOLHDL, LDLDIRECT in the last 72 hours. ------------------------------------------------------------------------------------------------------------------ No results for input(s): TSH, T4TOTAL, T3FREE, THYROIDAB in the last 72 hours.  Invalid input(s): FREET3 ------------------------------------------------------------------------------------------------------------------ No results for input(s): VITAMINB12, FOLATE, FERRITIN, TIBC, IRON, RETICCTPCT in the last 72 hours.  Coagulation profile No results for input(s): INR, PROTIME in the last 168 hours.  No results for input(s): DDIMER in the last 72 hours.  Cardiac Enzymes  Recent Labs Lab 04/03/15 1723 04/04/15 0116 04/04/15 0908  TROPONINI 0.03 <0.03 <0.03   ------------------------------------------------------------------------------------------------------------------ Invalid input(s): POCBNP    Assessment & Plan   24 year old female with past medical history of mental retardation, paranoid schizophrenia, hypertension, history of seizures, who presents to the hospital due to ataxic gait, altered mental status and noted to have lithium toxicity.  #  1 altered mental status/encephalopathy-this is likely metabolic in nature and related to lithium toxicity. Slowly improving,back to baseline.  #2 pauses  : Likely secondary to underlying sleep apnea.  The cardiology note. Patient would not  likely tolerate deep studies. Oxygen did not seem to help with pauses. Patient is transferred to the telemetry. Monitor today. Likely sent to group home tomorrow. #3 lithium toxicity-this is likely the setting of patient's increasing dose and also probably underlying acute renal failure. Status post hemodialysis with resolution, seen by nephrology, patient lithium levels are down, no further indication for dialysis at this time. Started on lithium low-dose 300 mg at night by psychiatry/    #4ataxia-this is likely related to the patient's lithium toxicity. OOB to chair today,PT eval  #5 acute renal failure-this is likely in setting of dehydration and poor by mouth intake. Patient's renal function is stable creatinine possibly falsely low due to hemodialysis   #5.aspiration pneumonia improving with IV Levaquin.  #6 hypertension blood pressure meds on hold for now due to hypotension. #7 history of seizures-no evidence of acute seizure type activity. Changed to Depakote PO. #8 severe hypok repalced  #9 severe constipation on  Bowel regimn Nutrition: Seen by speech,tolerating dysphagia diet,d/c iv fluids. Spoke with group home director   Pertinent history of schizophrenia, mental retardation. Started  on lithium by psychiatry  10. right lower lobe pneumonia: Continue oxygen, Levaquin. WBC is down.    Code Status Orders        Start     Ordered   03/29/15 1951  Full code   Continuous     03/29/15 1952           Consults  nephrology     Lab Results  Component Value Date   PLT 115* 04/05/2015     Time Spent in minutes   of critical care    Aixa Corsello M.D on 04/05/2015 at 11:58 AM  Between 7am to 6pm - Pager - [phone]  After 6pm go to [email or web address] - password [email or web address]  [facility]   Office  [phone]

## 2015-04-05 NOTE — Progress Notes (Signed)
SATURATION QUALIFICATIONS: (This note is used to comply with regulatory documentation for home oxygen)  Patient Saturations on Room Air at Rest = 100%  Patient Saturations on Room Air while Ambulating = 97%  Patient Saturations on 2 Liters of oxygen while Ambulating = 100%  Please briefly explain why patient needs home oxygen:

## 2015-04-06 LAB — POTASSIUM: Potassium: 4.4 mmol/L (ref 3.5–5.1)

## 2015-04-06 LAB — PHOSPHORUS: PHOSPHORUS: 3.4 mg/dL (ref 2.5–4.6)

## 2015-04-06 LAB — MAGNESIUM: Magnesium: 1.8 mg/dL (ref 1.7–2.4)

## 2015-04-06 MED ORDER — LITHIUM CARBONATE 300 MG PO CAPS: 300.0000 mg | ORAL_CAPSULE | Freq: Every day | ORAL | Status: AC

## 2015-04-06 MED ORDER — LEVOFLOXACIN 500 MG PO TABS: 500.0000 mg | ORAL_TABLET | Freq: Every day | ORAL | Status: DC

## 2015-04-06 NOTE — Discharge Summary (Signed)
Hannah Keller, is a 24 y.o. female  DOB Nov 01, 1990  MRN [id number].  Admission date:  03/29/2015  Admitting Physician  Houston Siren, MD  Discharge Date:  04/06/2015   Primary MD  Domenic Schwab, FNP  Recommendations for primary care physician for things to follow:  Follow up with    Admission Diagnosis  Lithium toxicity, accidental or unintentional, initial encounter [T56.891A] Acute renal failure, unspecified acute renal failure type [N17.9]   Discharge Diagnosis  Lithium toxicity, accidental or unintentional, initial encounter [T56.891A] Acute renal failure, unspecified acute renal failure type [N17.9]    Principal Problem:   Lithium toxicity Active Problems:   Acute renal failure syndrome   AV block, Mobitz II   Sinus pause      Past Medical History  Diagnosis Date  . Mental retardation     Mild  . Paranoid schizophrenia   . Seizures   . Developmental delay     History reviewed. No pertinent past surgical history.     History of present illness and  Hospital Course:     Kindly see H&P for history of present illness and admission details, please review complete Labs, Consult reports and Test reports for all details in brief  HPI  from the history and physical done on the day of admission  24 yr old female with h/o mental retardation,h/o seizure/seizure,admitted for ataxia, worsening mental status.found to have Lithium toxicity, lithium levels are 4.40 on admission.  Hospital Course   1.thium toxicity causing ataxia, tremors; found to have lithium levels of 4.40 with renal failure "42 creatinine 2.45 on admission. Admitted to intensive care unit. Patient seen by nephrology, had emergency hemodialysis. And there are takes lithium 600 mg twice a day. Initially poison control center was called and  they recommended the dialysis. Had emergency dialysis catheter by vascular. Received 3 sessions of hemodialysis, patient lithium levels really decreased to 0.8 on September 2. Last dialysis was August 30. She is more alert and oriented.  2.Mental  retardation, status post schizophrenia: Patient seen by psychiatry. As her lithium levels are normalized, noted her on 300 mg of lithium at night. As per psychiatric recommendation.she  was on Depakote, Abilify, Paxil. Continue them. 3 .Arythmia/Pauses /patient had pauses noted on telemetry. Seen by cardiology Dr. Dossie Arbour. Initially she was hypoxic when the pauses happened. She was given oxygen. Her pauses continued even with oxygen. So Dr. WUJ:WJXBJY  Recommended discontinue oxygen. Case discussed with Dr. Sherryl Manges from EP. Etiology of pauses likely central sleep apnea possible mild component of obstruction. Patient mental status will not permit, she will not be able to tolerate sleep studies. #4 Asian pneumonia; improved with Levaquin, discharged to group home with Levaquin.   Discharge Condition: stable   Follow UP  Follow-up Information    Follow up with Lindley,Cheryl Paulette, FNP In 1 week.   Specialty:  Family Medicine   Contact information:   [address] [place] [address] [phone]         Discharge Instructions  and  Discharge Medications        Medication List    STOP taking these medications        LITHOBID 300 MG CR tablet  Generic drug:  lithium carbonate  Replaced by:  lithium carbonate 300 MG capsule      TAKE these medications        acetaminophen 325 MG tablet  Commonly known as:  TYLENOL  Take 325 mg by mouth every  6 (six) hours as needed for mild pain, fever or headache.     ARIPiprazole 15 MG tablet  Commonly known as:  ABILIFY  Take 15 mg by mouth daily at 12 noon.     benztropine 0.5 MG tablet  Commonly known as:  COGENTIN  Take 0.5 mg by mouth at bedtime.     cholecalciferol 1000 UNITS  tablet  Commonly known as:  VITAMIN D  Take 1,000 Units by mouth 2 (two) times daily.     clonazePAM 0.5 MG tablet  Commonly known as:  KLONOPIN  Take 0.5 mg by mouth every 6 (six) hours as needed (for agitation).     cloNIDine 0.3 mg/24hr patch  Commonly known as:  CATAPRES - Dosed in mg/24 hr  Place 0.3 mg onto the skin once a week. Pt applies on Friday.     divalproex 125 MG capsule  Commonly known as:  DEPAKOTE SPRINKLE  Take 750 mg by mouth 2 (two) times daily.     docusate sodium 100 MG capsule  Commonly known as:  COLACE  Take 200 mg by mouth daily.     ferrous sulfate 325 (65 FE) MG tablet  Take 325 mg by mouth daily.     hydrochlorothiazide 25 MG tablet  Commonly known as:  HYDRODIURIL  Take 25 mg by mouth daily.     levofloxacin 500 MG tablet  Commonly known as:  LEVAQUIN  Take 1 tablet (500 mg total) by mouth daily.     lithium carbonate 300 MG capsule  Take 1 capsule (300 mg total) by mouth at bedtime.     meclizine 25 MG tablet  Commonly known as:  ANTIVERT  Take 25 mg by mouth 3 (three) times daily as needed for dizziness.     medroxyPROGESTERone 150 MG/ML injection  Commonly known as:  DEPO-PROVERA  Inject 150 mg into the muscle every 3 (three) months.     nystatin cream  Commonly known as:  MYCOSTATIN  Apply 1 application topically 2 (two) times daily.     PARoxetine 20 MG tablet  Commonly known as:  PAXIL  Take 20 mg by mouth daily.     PATADAY 0.2 % Soln  Generic drug:  Olopatadine HCl  Apply 1 drop to eye daily as needed (for allergies).     polyethylene glycol packet  Commonly known as:  MIRALAX / GLYCOLAX  Take 17 g by mouth every other day.     QUEtiapine 50 MG tablet  Commonly known as:  SEROQUEL  Take 50 mg by mouth 3 (three) times daily.     SAPHRIS 10 MG Subl  Generic drug:  Asenapine Maleate  Place 1 tablet under the tongue 3 (three) times daily.     solifenacin 10 MG tablet  Commonly known as:  VESICARE  Take 10 mg by  mouth daily.     valACYclovir 1000 MG tablet  Commonly known as:  VALTREX  Take 2,000 mg by mouth 2 (two) times daily as needed (for fever blisters).          Diet and Activity recommendation: See Discharge Instructions above   Consults obtained -psychiatry Nephrology vascular   Major procedures and Radiology Reports - PLEASE review detailed and final reports for all details, in brief -      Dg Chest 1 View  04/03/2015   CLINICAL DATA:  No developmental delays. Congestion after vomiting. Hypoxia.  EXAM: CHEST  1 VIEW  COMPARISON:  03/29/2015  FINDINGS: Shallow lung inflation.  There is mild patchy density in the medial right lung base raising the question of developing infiltrate. The left lung is clear. No pulmonary edema. Visualized bowel gas pattern is nonobstructive.  IMPRESSION: 1. Shallow inflation. 2. Suspect developing infiltrate in the right lower lobe.   Electronically Signed   By: Norva Pavlov M.D.   On: 04/03/2015 12:49   Dg Chest 2 View  03/29/2015   CLINICAL DATA:  Congestion after vomiting.  EXAM: CHEST  2 VIEW  COMPARISON:  None.  FINDINGS: Marked hypoventilation, limiting sensitivity. No gross asymmetry in aeration. No effusion or air leak. Normal heart size and mediastinal contours.  Stool dilated colon visualized in the upper abdomen.  IMPRESSION: 1. Very limited low volume chest, cannot exclude aspiration pneumonitis. 2. Marked stool retention in the upper abdomen.   Electronically Signed   By: Marnee Spring M.D.   On: 03/29/2015 15:33   Mr Brain Wo Contrast  03/23/2015   CLINICAL DATA:  24 year old female recently diagnosed with ear infection status post antibiotics but continued ataxia, unsteady gait. Initial encounter.  EXAM: MRI HEAD WITHOUT CONTRAST  TECHNIQUE: Multiplanar, multiecho pulse sequences of the brain and surrounding structures were obtained without intravenous contrast.  COMPARISON:  [facility] Brain MRI 12/31/2012  FINDINGS: Rapid  scanning technique employed.  Chronic widespread hyperostosis of the skull. Stable cerebral volume. Major intracranial vascular flow voids appear stable. No restricted diffusion or evidence of acute infarction. No ventriculomegaly (stable mild colpocephaly). No midline shift or intracranial mass lesion. No acute or chronic intracranial blood products identified. No encephalomalacia identified. Wallace Cullens and white matter signal appears stable. Negative pituitary and cervicomedullary junction. Stable visualized cervical spine.  There is new right ethmoid air cell fluid/opacification. Mild bilateral maxillary sinus mucosal thickening is new. Other Visualized paranasal sinuses and mastoids are clear. No tympanic cavity fluid. Chronic diminutive appearance of the IAC.  Orbits soft tissues appear stable and within normal limits. Scalp soft tissues appear normal.  IMPRESSION: 1. Stable noncontrast MRI appearance of the brain since 2014. 2. Mild-to-moderate paranasal sinus inflammation primarily at the right ethmoids. Mastoids and tympanic cavities are clear. 3. 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 If there are multiple cranial neuropathies consider cranial nerve entrapment such as in hyperostosis cranialis interna.   Electronically Signed   By: Odessa Fleming M.D.   On: 03/23/2015 14:03    Micro Results    Recent Results (from the past 240 hour(s))  MRSA PCR Screening     Status: None   Collection Time: 03/29/15  3:53 PM  Result Value Ref Range Status   MRSA by PCR NEGATIVE NEGATIVE Final    Comment:        The GeneXpert MRSA Assay (FDA approved for NASAL specimens only), is one component of a comprehensive MRSA colonization surveillance program. It is not intended to diagnose MRSA infection nor to guide or monitor treatment for MRSA infections.        Today   Subjective:   Marcellina Jonsson today has no headache,no chest abdominal pain,no new weakness tingling or numbness, feels  much better wants to go home today.   Objective:   Blood pressure 107/51, pulse 92, temperature 98 F (36.7 C), temperature source Oral, resp. rate 21, height 5' (1.524 m), weight 70.9 kg (156 lb 4.9 oz), SpO2 100 %.   Intake/Output Summary (Last 24 hours) at 04/06/15 0959 Last data filed at 04/06/15 0800  Gross per 24 hour  Intake   1180 ml  Output  0 ml  Net   1180 ml    Exam Awake Alert, Oriented x 3, No new F.N deficits, Normal affect [place].AT,PERRAL Supple Neck,No JVD, No cervical lymphadenopathy appriciated.  Symmetrical Chest wall movement, Good air movement bilaterally, CTAB RRR,No Gallops,Rubs or new Murmurs, No Parasternal Heave +ve B.Sounds, Abd Soft, Non tender, No organomegaly appriciated, No rebound -guarding or rigidity. No Cyanosis, Clubbing or edema, No new Rash or bruise  Data Review   CBC w Diff: Lab Results  Component Value Date   WBC 11.6* 04/05/2015   WBC 10.7 03/23/2012   HGB 7.4* 04/05/2015   HGB 12.9 03/23/2012   HCT 22.6* 04/05/2015   HCT 38.7 03/23/2012   PLT 115* 04/05/2015   PLT 213 03/23/2012    CMP: Lab Results  Component Value Date   NA 140 04/05/2015   NA 138 03/23/2012   K 4.4 04/06/2015   K 4.3 03/23/2012   CL 111 04/05/2015   CL 107 03/23/2012   CO2 26 04/05/2015   CO2 25 03/23/2012   BUN 9 04/05/2015   BUN 13 03/23/2012   CREATININE 1.11* 04/05/2015   CREATININE 1.16 03/23/2012   PROT 5.5* 03/31/2015   PROT 7.6 03/23/2012   ALBUMIN 2.3* 04/01/2015   ALBUMIN 3.5 03/23/2012   BILITOT 0.6 03/31/2015   BILITOT 0.1* 03/23/2012   ALKPHOS 108 03/31/2015   ALKPHOS 64 03/23/2012   AST 32 03/31/2015   AST 36 03/23/2012   ALT 21 03/31/2015   ALT 17 03/23/2012  .   Total Time in preparing paper work, data evaluation and todays exam - 35 minutes  Lillian Tigges M.D on 04/06/2015 at 9:59 AM

## 2015-04-06 NOTE — Progress Notes (Signed)
Patient discharged to group home, awaiting wheelchair at this time. Paper scrubs, hospital socks, and incontinence brief provided as group home representatives did not bring patient's clothes with them. Representatives expressed some concern about certain aspects of patient's discharge paper provided by Child psychotherapist. RN notified Child psychotherapist and Child psychotherapist discussed with representatives.

## 2015-04-06 NOTE — Progress Notes (Signed)
PHARMACY - CRITICAL CARE PROGRESS NOTE  Pharmacy Consult for Electrolyte Supplementation    Allergies  Allergen Reactions  . Ritalin [Methylphenidate Hcl] Other (See Comments)    Reaction:  Unknown     Patient Measurements: Height: 5' (152.4 cm) Weight: 156 lb 4.9 oz (70.9 kg) IBW/kg (Calculated) : 45.5   Vital Signs: Temp: 99.4 F (37.4 C) (09/05 2100) Temp Source: Axillary (09/05 2100) BP: 97/81 mmHg (09/06 0400) Pulse Rate: 106 (09/06 0400) Intake/Output from previous day: 09/05 0701 - 09/06 0700 In: 1180 [P.O.:1080; IV Piggyback:100] Out: -  Intake/Output from this shift:       Labs:  Recent Labs  04/03/15 1006 04/05/15 0821 04/06/15 0404  WBC 17.0* 11.6*  --   HGB 8.4* 7.4*  --   HCT 25.5* 22.6*  --   PLT 95* 115*  --   CREATININE  --  1.11*  --   MG  --  1.7 1.8  PHOS  --  3.1 3.4   Estimated Creatinine Clearance: 68.7 mL/min (by C-G formula based on Cr of 1.11).  No results for input(s): GLUCAP in the last 72 hours. BMP Latest Ref Rng 04/06/2015 04/05/2015 04/03/2015  Glucose 65 - 99 mg/dL - 295(A) 213(Y)  BUN 6 - 20 mg/dL - 9 19  Creatinine 8.65 - 1.00 mg/dL - 7.84(O) 9.62(X)  Sodium 135 - 145 mmol/L - 140 142  Potassium 3.5 - 5.1 mmol/L 4.4 4.0 3.9  Chloride 101 - 111 mmol/L - 111 112(H)  CO2 22 - 32 mmol/L - 26 27  Calcium 8.9 - 10.3 mg/dL - 9.4 9.7    Medications:  Scheduled:  . ARIPiprazole  15 mg Oral Daily  . divalproex  750 mg Oral Q12H  . heparin  5,000 Units Subcutaneous 3 times per day  . levofloxacin (LEVAQUIN) IV  500 mg Intravenous q1800  . lithium carbonate  300 mg Oral QHS  . nystatin cream  1 application Topical BID  . olopatadine  1 drop Both Eyes BID   Infusions:     Assessment: Pharmacy consulted to correct electrolyte abnormalities in a 24 yo female admitted for lithium toxicity.   Goal of Therapy:  Electrolytes within normal limits  Plan:  Electrolytes are WNL.  Will recheck labs in AM.   Pharmacy will continue  to follow.  9/6 AM potassium, magnesium and phosphorus all WNL.  Recheck electrolytes in AM.  Alaysha Jefcoat S, Pharm.D. Clinical Pharmacist 04/06/2015,5:22 AM

## 2015-04-06 NOTE — Progress Notes (Signed)
Clarified with Dr. Luberta Mutter on the phone that patient's orders should only be for transfer to 2A, not for any med surg floor. RN will ensure only 2A order remains.

## 2015-04-15 ENCOUNTER — Other Ambulatory Visit: Payer: Self-pay | Admitting: Gastroenterology

## 2015-04-15 DIAGNOSIS — R1310 Dysphagia, unspecified: Secondary | ICD-10-CM

## 2015-04-21 ENCOUNTER — Ambulatory Visit
Admission: RE | Admit: 2015-04-21 | Discharge: 2015-04-21 | Disposition: A | Discharge status: Home / Self Care | Payer: Medicaid Other | Source: Ambulatory Visit | Attending: Gastroenterology | Admitting: Gastroenterology

## 2015-04-21 DIAGNOSIS — R1310 Dysphagia, unspecified: Secondary | ICD-10-CM | POA: Insufficient documentation

## 2015-05-17 ENCOUNTER — Encounter: Payer: Self-pay | Admitting: *Deleted

## 2015-05-18 ENCOUNTER — Encounter: Payer: Self-pay | Admitting: Anesthesiology

## 2015-05-18 ENCOUNTER — Ambulatory Visit: Payer: Medicaid Other | Admitting: Anesthesiology

## 2015-05-18 ENCOUNTER — Encounter
Admission: RE | Disposition: A | Discharge status: Nursing Home (Includes ICF) | Payer: Self-pay | Source: Ambulatory Visit | Attending: Gastroenterology

## 2015-05-18 ENCOUNTER — Ambulatory Visit
Admission: RE | Admit: 2015-05-18 | Discharge: 2015-05-18 | Disposition: A | Discharge status: Nursing Home (Includes ICF) | Payer: Medicaid Other | Source: Ambulatory Visit | Attending: Gastroenterology | Admitting: Gastroenterology

## 2015-05-18 DIAGNOSIS — Z5309 Procedure and treatment not carried out because of other contraindication: Secondary | ICD-10-CM | POA: Insufficient documentation

## 2015-05-18 DIAGNOSIS — Z79899 Other long term (current) drug therapy: Secondary | ICD-10-CM | POA: Diagnosis not present

## 2015-05-18 DIAGNOSIS — R569 Unspecified convulsions: Secondary | ICD-10-CM | POA: Diagnosis not present

## 2015-05-18 DIAGNOSIS — R1310 Dysphagia, unspecified: Secondary | ICD-10-CM | POA: Diagnosis not present

## 2015-05-18 DIAGNOSIS — Z888 Allergy status to other drugs, medicaments and biological substances status: Secondary | ICD-10-CM | POA: Insufficient documentation

## 2015-05-18 DIAGNOSIS — R6250 Unspecified lack of expected normal physiological development in childhood: Secondary | ICD-10-CM | POA: Insufficient documentation

## 2015-05-18 DIAGNOSIS — F70 Mild intellectual disabilities: Secondary | ICD-10-CM | POA: Insufficient documentation

## 2015-05-18 DIAGNOSIS — K224 Dyskinesia of esophagus: Secondary | ICD-10-CM | POA: Diagnosis not present

## 2015-05-18 DIAGNOSIS — F200 Paranoid schizophrenia: Secondary | ICD-10-CM | POA: Insufficient documentation

## 2015-05-18 DIAGNOSIS — B3781 Candidal esophagitis: Secondary | ICD-10-CM | POA: Insufficient documentation

## 2015-05-18 DIAGNOSIS — K219 Gastro-esophageal reflux disease without esophagitis: Secondary | ICD-10-CM | POA: Diagnosis not present

## 2015-05-18 DIAGNOSIS — K2950 Unspecified chronic gastritis without bleeding: Secondary | ICD-10-CM | POA: Insufficient documentation

## 2015-05-18 DIAGNOSIS — G4730 Sleep apnea, unspecified: Secondary | ICD-10-CM | POA: Insufficient documentation

## 2015-05-18 HISTORY — PX: ESOPHAGOGASTRODUODENOSCOPY (EGD) WITH PROPOFOL: SHX5813

## 2015-05-18 LAB — KOH PREP: KOH Prep: NONE SEEN

## 2015-05-18 SURGERY — ESOPHAGOGASTRODUODENOSCOPY (EGD) WITH PROPOFOL
Anesthesia: General

## 2015-05-18 MED ORDER — LIDOCAINE HCL (PF) 2 % IJ SOLN
INTRAMUSCULAR | Status: DC | PRN
  Administered 2015-05-18: 50 mg

## 2015-05-18 MED ORDER — GLYCOPYRROLATE 0.2 MG/ML IJ SOLN
INTRAMUSCULAR | Status: DC | PRN
  Administered 2015-05-18: 0.2 mg via INTRAVENOUS

## 2015-05-18 MED ORDER — PROPOFOL 500 MG/50ML IV EMUL
INTRAVENOUS | Status: DC | PRN
  Administered 2015-05-18: 50 ug/kg/min via INTRAVENOUS

## 2015-05-18 MED ORDER — MIDAZOLAM HCL 5 MG/5ML IJ SOLN
INTRAMUSCULAR | Status: DC | PRN
  Administered 2015-05-18: 1 mg via INTRAVENOUS

## 2015-05-18 MED ORDER — SODIUM CHLORIDE 0.9 % IV SOLN
INTRAVENOUS | Status: DC
  Administered 2015-05-18: 09:00:00 via INTRAVENOUS

## 2015-05-18 MED ORDER — PROPOFOL 10 MG/ML IV BOLUS
INTRAVENOUS | Status: DC | PRN
  Administered 2015-05-18 (×2): 10 mg via INTRAVENOUS
  Administered 2015-05-18: 30 mg via INTRAVENOUS

## 2015-05-18 MED ORDER — FENTANYL CITRATE (PF) 100 MCG/2ML IJ SOLN
INTRAMUSCULAR | Status: DC | PRN
  Administered 2015-05-18: 50 ug via INTRAVENOUS

## 2015-05-18 NOTE — Anesthesia Preprocedure Evaluation (Addendum)
Anesthesia Evaluation  Patient identified by MRN, date of birth, ID band Patient awake    Reviewed: Allergy & Precautions, H&P , NPO status , Patient's Chart, lab work & pertinent test results  History of Anesthesia Complications (+) DIFFICULT IV STICK / SPECIAL LINE and history of anesthetic complications  Airway Mallampati: II  TM Distance: >3 FB Neck ROM: full    Dental  (+) Poor Dentition, Chipped, Missing   Pulmonary neg shortness of breath, sleep apnea ,    Pulmonary exam normal breath sounds clear to auscultation       Cardiovascular Exercise Tolerance: Good (-) angina(-) Past MI and (-) DOE Normal cardiovascular exam+ dysrhythmias  Rhythm:regular Rate:Normal     Neuro/Psych Seizures -, Well Controlled,  PSYCHIATRIC DISORDERS Schizophrenia    GI/Hepatic negative GI ROS, Neg liver ROS, neg GERD  ,  Endo/Other  negative endocrine ROS  Renal/GU Renal disease  negative genitourinary   Musculoskeletal   Abdominal   Peds  Hematology negative hematology ROS (+)   Anesthesia Other Findings Past Medical History:   Mental retardation                                             Comment:Mild   Paranoid schizophrenia (HCC)                                 Seizures (HCC)                                               Developmental delay                                         Patient & caregiver reports they do not think that any food or pills are stuck in her throat at this time.      Reproductive/Obstetrics negative OB ROS                           Anesthesia Physical Anesthesia Plan  ASA: III  Anesthesia Plan: General   Post-op Pain Management:    Induction:   Airway Management Planned:   Additional Equipment:   Intra-op Plan:   Post-operative Plan:   Informed Consent: I have reviewed the patients History and Physical, chart, labs and discussed the procedure including the  risks, benefits and alternatives for the proposed anesthesia with the patient or authorized representative who has indicated his/her understanding and acceptance.   Dental Advisory Given  Plan Discussed with: Anesthesiologist, CRNA and Surgeon  Anesthesia Plan Comments: (Verbal cons net from guardian/POA.  Patient is ward of the state.)        Anesthesia Quick Evaluation

## 2015-05-18 NOTE — Anesthesia Postprocedure Evaluation (Signed)
  Anesthesia Post-op Note  Patient: Hannah Keller  Procedure(s) Performed: Procedure(s): ESOPHAGOGASTRODUODENOSCOPY (EGD) WITH PROPOFOL (N/A)  Anesthesia type:General  Patient location: PACU  Post pain: Pain level controlled  Post assessment: Post-op Vital signs reviewed, Patient's Cardiovascular Status Stable, Respiratory Function Stable, Patent Airway and No signs of Nausea or vomiting  Post vital signs: Reviewed and stable  Last Vitals:  Filed Vitals:   05/18/15 1020  BP: 113/76  Pulse:   Temp:   Resp: 21    Level of consciousness: awake, alert  and patient cooperative  Complications: No apparent anesthesia complications

## 2015-05-18 NOTE — Op Note (Signed)
[facility] Gastroenterology Patient Name: Hannah SauceCora Ehrler Procedure Date: 05/18/2015 9:14 AM MRN: [id number] Account #: [id number] Date of Birth: 1991/06/07 Admit Type: Outpatient Age: [age] [facility] 3 Gender: Female Note Status: Finalized Procedure:         Upper GI endoscopy Indications:       Esophageal reflux Providers:         Christena DeemMartin U. Callee Rohrig, MD Referring MD:      Fernand Parkinsheryl P. Clint GuyLindley (Referring MD) Medicines:         Monitored Anesthesia Care Complications:     No immediate complications. Procedure:         Pre-Anesthesia Assessment:                    - ASA Grade Assessment: III - A patient with severe                     systemic disease.                    After obtaining informed consent, the endoscope was passed                     under direct vision. Throughout the procedure, the                     patient's blood pressure, pulse, and oxygen saturations                     were monitored continuously. The Endoscope was introduced                     through the mouth, and advanced to the prepyloric region,                     stomach. The patient tolerated the procedure well. The                     upper GI endoscopy was unusually difficult due to a                     J-shaped stomach which made pyloric intubation difficult. Findings:      Abnormal motility was noted in the esophagus. The cricopharyngeus was       normal. There is a decrease in motility of the esophageal body. The       distal esophagus/lower esophageal sphincter is open. There is a       shelf-like portion noted in the lumen at about 30 c, from the alveolar       ridge.      The Z-line was variable. Biopsies were taken with a cold forceps for       histology.      Patchy mild inflammation characterized by erosions was found in the       gastric antrum. Biopsies were taken with a cold forceps for histology.       Biopsies were taken with a cold forceps for Helicobacter  pylori testing.      The cardia and gastric fundus were normal on retroflexion.      -the stomach had a very tight J shape that caused the scope to coil in       the upper stomach despite a shjort approach I was unable to intubate the       pylorus/duodenum.  Patchy candidiasis was found in the middle third of the esophagus. Impression:        - Esophageal motility disorder.                    - Z-line variable. Biopsied.                    - Gastritis. Biopsied.                    - Monilial esophagitis. Recommendation:    - Use Prilosec (omeprazole) 20 mg PO daily daily.                    - Mycelex (clotrimazole) 10 mg lozenge 5x/day for 5 days. Procedure Code(s): --- Professional ---                    [id number], 52, Esophagogastroduodenoscopy, flexible,                     transoral; with biopsy, single or multiple Diagnosis Code(s): --- Professional ---                    530.5, Dyskinesia of esophagus                    530.89, Other specified disorders of esophagus                    535.50, Unspecified gastritis and gastroduodenitis,                     without mention of hemorrhage                    112.84, Candidal esophagitis                    530.81, Esophageal reflux CPT copyright 2014 [employer] Medical [employer]. All rights reserved. The codes documented in this report are preliminary and upon coder review may  be revised to meet current compliance requirements. Christena Deem, MD 05/18/2015 9:52:05 AM This report has been signed electronically. Number of Addenda: 0 Note Initiated On: 05/18/2015 9:14 AM      [facility]

## 2015-05-18 NOTE — H&P (Signed)
Outpatient short stay form Pre-procedure 05/18/2015 7:44 PM Christena Deem MD  Primary Physician: Dr. Lacie Scotts  Reason for visit:  Dysphagia, regurgitation  History of present illness:  Ms. Fotheringham is a 24 year old female who is presenting today for an EGD. He has some cognitive difficulties. States that food seems to get hung up when she swallows this is apparently in the mid chest area and sometimes she regurgitates food. She does not intentionally regurgitate foods. He has had a modified barium swallow showing a moderate to severe esophageal dysmotility impacting the pharyngeal phase of swallowing. Was increased pharyngeal residue. An EGD was recommended. She had a esophagram done on 04/21/2015 this showing no focal abnormality no evidence of obstruction and no evidence of reflux. sHe presents today for EGD.    Current facility-administered medications:  .  0.9 %  sodium chloride infusion, , Intravenous, Continuous, Christena Deem, MD .  0.9 %  sodium chloride infusion, , Intravenous, Continuous, Christena Deem, MD, Last Rate: 20 mL/hr at 05/18/15 1308  Prescriptions prior to admission  Medication Sig Dispense Refill Last Dose  . ARIPiprazole (ABILIFY) 15 MG tablet Take 15 mg by mouth daily at 12 noon.   05/17/2015 at 1200  . Asenapine Maleate (SAPHRIS) 10 MG SUBL Place 1 tablet under the tongue 3 (three) times daily.    05/17/2015 at 2000  . benztropine (COGENTIN) 0.5 MG tablet Take 0.5 mg by mouth at bedtime.   05/17/2015 at 2000  . divalproex (DEPAKOTE SPRINKLE) 125 MG capsule Take 750 mg by mouth 2 (two) times daily.    05/17/2015 at 0800  . hydrochlorothiazide (HYDRODIURIL) 25 MG tablet Take 25 mg by mouth daily.   05/17/2015 at 0800  . lithium carbonate 300 MG capsule Take 1 capsule (300 mg total) by mouth at bedtime. 20 capsule 0 05/17/2015 at 2000  . PARoxetine (PAXIL) 20 MG tablet Take 20 mg by mouth daily.   05/17/2015 at 0800  . polyethylene glycol (MIRALAX / GLYCOLAX)  packet Take 17 g by mouth every other day.   05/17/2015 at 2000  . QUEtiapine (SEROQUEL) 50 MG tablet Take 50 mg by mouth 3 (three) times daily.   05/17/2015 at 2000  . solifenacin (VESICARE) 10 MG tablet Take 10 mg by mouth daily.   05/17/2015 at 2000  . acetaminophen (TYLENOL) 325 MG tablet Take 325 mg by mouth every 6 (six) hours as needed for mild pain, fever or headache.   PRN at PRN  . cholecalciferol (VITAMIN D) 1000 UNITS tablet Take 1,000 Units by mouth 2 (two) times daily.   03/29/2015 at Unknown time  . clonazePAM (KLONOPIN) 0.5 MG tablet Take 0.5 mg by mouth every 6 (six) hours as needed (for agitation).   PRN at PRN  . cloNIDine (CATAPRES - DOSED IN MG/24 HR) 0.3 mg/24hr patch Place 0.3 mg onto the skin once a week. Pt applies on Friday.   03/26/2015 at unknown  . docusate sodium (COLACE) 100 MG capsule Take 200 mg by mouth daily.    03/29/2015 at Unknown time  . ferrous sulfate 325 (65 FE) MG tablet Take 325 mg by mouth daily.   03/29/2015 at Unknown time  . levofloxacin (LEVAQUIN) 500 MG tablet Take 1 tablet (500 mg total) by mouth daily. 3 tablet 0   . meclizine (ANTIVERT) 25 MG tablet Take 25 mg by mouth 3 (three) times daily as needed for dizziness.   03/28/2015 at Unknown time  . medroxyPROGESTERone (DEPO-PROVERA) 150 MG/ML injection Inject 150 mg  into the muscle every 3 (three) months.   03/22/2015 at unknown  . nystatin cream (MYCOSTATIN) Apply 1 application topically 2 (two) times daily.   03/28/2015 at Unknown time  . Olopatadine HCl (PATADAY) 0.2 % SOLN Apply 1 drop to eye daily as needed (for allergies).    PRN at PRN  . valACYclovir (VALTREX) 1000 MG tablet Take 2,000 mg by mouth 2 (two) times daily as needed (for fever blisters).   PRN at PRN     Allergies  Allergen Reactions  . Ritalin [Methylphenidate Hcl] Other (See Comments)    Reaction:  Unknown      Past Medical History  Diagnosis Date  . Mental retardation     Mild  . Paranoid schizophrenia (HCC)   . Seizures  (HCC)   . Developmental delay     Review of systems:      Physical Exam    Heart and lungs: Regular rate and rhythm without rub or gallop, lungs are bilaterally clear    HEENT: Normocephalic atraumatic eyes are anicteric    Other:     Pertinant exam for procedure: Soft nontender nondistended bowel sounds are positive normoactive    Planned proceedures: EGD and indicated procedures I have discussed the risks benefits and complications of procedures to include not limited to bleeding, infection, perforation and the risk of sedation and the patient wishes to proceed.    Christena DeemMartin U Skulskie, MD Gastroenterology 05/18/2015  7:44 PM

## 2015-05-18 NOTE — Transfer of Care (Signed)
Immediate Anesthesia Transfer of Care Note  Patient: Hannah SauceCora Keller  Procedure(s) Performed: Procedure(s): ESOPHAGOGASTRODUODENOSCOPY (EGD) WITH PROPOFOL (N/A)  Patient Location: PACU  Anesthesia Type:General  Level of Consciousness: sedated  Airway & Oxygen Therapy: Patient Spontanous Breathing and Patient connected to nasal cannula oxygen  Post-op Assessment: Report given to RN and Post -op Vital signs reviewed and stable  Post vital signs: Reviewed and stable  Last Vitals:  Filed Vitals:   05/18/15 0850  BP: 128/72  Pulse: 80  Temp: 35.8 C  Resp: 17    Complications: No apparent anesthesia complications

## 2015-05-19 ENCOUNTER — Encounter: Payer: Self-pay | Admitting: Gastroenterology

## 2015-05-19 LAB — SURGICAL PATHOLOGY

## 2016-04-11 ENCOUNTER — Emergency Department
Admission: EM | Admit: 2016-04-11 | Discharge: 2016-04-12 | Disposition: A | Discharge status: Home / Self Care | Payer: Medicaid Other | Attending: Emergency Medicine | Admitting: Emergency Medicine

## 2016-04-11 DIAGNOSIS — Z791 Long term (current) use of non-steroidal anti-inflammatories (NSAID): Secondary | ICD-10-CM | POA: Diagnosis not present

## 2016-04-11 DIAGNOSIS — F819 Developmental disorder of scholastic skills, unspecified: Secondary | ICD-10-CM | POA: Insufficient documentation

## 2016-04-11 DIAGNOSIS — F200 Paranoid schizophrenia: Secondary | ICD-10-CM

## 2016-04-11 DIAGNOSIS — Z046 Encounter for general psychiatric examination, requested by authority: Secondary | ICD-10-CM | POA: Diagnosis present

## 2016-04-11 DIAGNOSIS — F71 Moderate intellectual disabilities: Secondary | ICD-10-CM | POA: Diagnosis present

## 2016-04-11 DIAGNOSIS — Z79899 Other long term (current) drug therapy: Secondary | ICD-10-CM | POA: Diagnosis not present

## 2016-04-11 DIAGNOSIS — R6250 Unspecified lack of expected normal physiological development in childhood: Secondary | ICD-10-CM

## 2016-04-11 LAB — CBC
HEMATOCRIT: 36.2 % (ref 35.0–47.0)
Hemoglobin: 12.3 g/dL (ref 12.0–16.0)
MCH: 31.2 pg (ref 26.0–34.0)
MCHC: 34 g/dL (ref 32.0–36.0)
MCV: 91.9 fL (ref 80.0–100.0)
PLATELETS: 225 10*3/uL (ref 150–440)
RBC: 3.94 MIL/uL (ref 3.80–5.20)
RDW: 12.6 % (ref 11.5–14.5)
WBC: 10.4 10*3/uL (ref 3.6–11.0)

## 2016-04-11 LAB — COMPREHENSIVE METABOLIC PANEL
ALT: 13 U/L — ABNORMAL LOW (ref 14–54)
AST: 24 U/L (ref 15–41)
Albumin: 3.6 g/dL (ref 3.5–5.0)
Alkaline Phosphatase: 49 U/L (ref 38–126)
Anion gap: 5 (ref 5–15)
BUN: 18 mg/dL (ref 6–20)
CHLORIDE: 110 mmol/L (ref 101–111)
CO2: 23 mmol/L (ref 22–32)
Calcium: 9.3 mg/dL (ref 8.9–10.3)
Creatinine, Ser: 1.28 mg/dL — ABNORMAL HIGH (ref 0.44–1.00)
GFR, EST NON AFRICAN AMERICAN: 58 mL/min — AB (ref >60)
Glucose, Bld: 118 mg/dL — ABNORMAL HIGH (ref 65–99)
POTASSIUM: 3.5 mmol/L (ref 3.5–5.1)
SODIUM: 138 mmol/L (ref 135–145)
Total Bilirubin: 0.2 mg/dL — ABNORMAL LOW (ref 0.3–1.2)
Total Protein: 7.2 g/dL (ref 6.5–8.1)

## 2016-04-11 LAB — LITHIUM LEVEL: Lithium Lvl: 0.45 mmol/L — ABNORMAL LOW (ref 0.60–1.20)

## 2016-04-11 LAB — URINE DRUG SCREEN, QUALITATIVE (ARMC ONLY)
AMPHETAMINES, UR SCREEN: NOT DETECTED
BENZODIAZEPINE, UR SCRN: POSITIVE — AB
Barbiturates, Ur Screen: NOT DETECTED
CANNABINOID 50 NG, UR ~~LOC~~: NOT DETECTED
Cocaine Metabolite,Ur ~~LOC~~: NOT DETECTED
MDMA (Ecstasy)Ur Screen: NOT DETECTED
Methadone Scn, Ur: NOT DETECTED
OPIATE, UR SCREEN: NOT DETECTED
PHENCYCLIDINE (PCP) UR S: NOT DETECTED
Tricyclic, Ur Screen: POSITIVE — AB

## 2016-04-11 LAB — SALICYLATE LEVEL: Salicylate Lvl: <4 mg/dL (ref 2.8–30.0)

## 2016-04-11 LAB — ETHANOL

## 2016-04-11 LAB — ACETAMINOPHEN LEVEL: Acetaminophen (Tylenol), Serum: <10 ug/mL — ABNORMAL LOW (ref 10–30)

## 2016-04-11 LAB — VALPROIC ACID LEVEL: VALPROIC ACID LVL: 43 ug/mL — AB (ref 50.0–100.0)

## 2016-04-11 NOTE — ED Notes (Signed)
Pt given another sprite at this time and pt informed that after this sprite she could only have water for the rest of the night.
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
## 2016-04-11 NOTE — ED Notes (Signed)
Pt given sandwich tray and sprite at this time. 
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
## 2016-04-12 DIAGNOSIS — F71 Moderate intellectual disabilities: Secondary | ICD-10-CM | POA: Diagnosis not present

## 2016-04-12 DIAGNOSIS — F200 Paranoid schizophrenia: Secondary | ICD-10-CM

## 2016-04-12 LAB — PREGNANCY, URINE: Preg Test, Ur: NEGATIVE

## 2016-04-12 MED ORDER — OLOPATADINE HCL 0.1 % OP SOLN
1.0000 [drp] | Freq: Two times a day (BID) | OPHTHALMIC | Status: DC
Start: 2016-04-12 — End: 2016-04-13
  Administered 2016-04-12: 1 [drp] via OPHTHALMIC
  Filled 2016-04-12: qty 5

## 2016-04-12 MED ORDER — DARIFENACIN HYDROBROMIDE ER 7.5 MG PO TB24
15.0000 mg | ORAL_TABLET | Freq: Every day | ORAL | Status: DC
  Administered 2016-04-12: 15 mg via ORAL
  Filled 2016-04-12: qty 1

## 2016-04-12 MED ORDER — POLYETHYLENE GLYCOL 3350 17 G PO PACK
17.0000 g | PACK | ORAL | Status: DC
  Administered 2016-04-12: 17 g via ORAL
  Filled 2016-04-12: qty 1

## 2016-04-12 MED ORDER — DOCUSATE SODIUM 100 MG PO CAPS
200.0000 mg | ORAL_CAPSULE | Freq: Every day | ORAL | Status: DC
  Administered 2016-04-12: 200 mg via ORAL
  Filled 2016-04-12: qty 2

## 2016-04-12 MED ORDER — ASENAPINE MALEATE 5 MG SL SUBL
10.0000 mg | SUBLINGUAL_TABLET | Freq: Three times a day (TID) | SUBLINGUAL | Status: DC
  Administered 2016-04-12 (×2): 10 mg via SUBLINGUAL
  Filled 2016-04-12 (×3): qty 2

## 2016-04-12 MED ORDER — ARIPIPRAZOLE 5 MG PO TABS
15.0000 mg | ORAL_TABLET | Freq: Every day | ORAL | Status: DC
  Administered 2016-04-12: 15 mg via ORAL
  Filled 2016-04-12 (×2): qty 3

## 2016-04-12 MED ORDER — MEDROXYPROGESTERONE ACETATE 150 MG/ML IM SUSP: 150.0000 mg | INTRAMUSCULAR | Status: DC

## 2016-04-12 MED ORDER — ACETAMINOPHEN 325 MG PO TABS: 325.0000 mg | ORAL_TABLET | Freq: Four times a day (QID) | ORAL | Status: DC | PRN

## 2016-04-12 MED ORDER — PANTOPRAZOLE SODIUM 40 MG PO TBEC
40.0000 mg | DELAYED_RELEASE_TABLET | Freq: Every day | ORAL | Status: DC
  Administered 2016-04-12: 40 mg via ORAL
  Filled 2016-04-12: qty 1

## 2016-04-12 MED ORDER — LITHIUM CARBONATE 300 MG PO CAPS
300.0000 mg | ORAL_CAPSULE | Freq: Every day | ORAL | Status: DC
  Administered 2016-04-12: 300 mg via ORAL
  Filled 2016-04-12: qty 1

## 2016-04-12 MED ORDER — DIVALPROEX SODIUM 125 MG PO CSDR: 750.0000 mg | DELAYED_RELEASE_CAPSULE | Freq: Every day | ORAL | Status: DC

## 2016-04-12 MED ORDER — PAROXETINE HCL 20 MG PO TABS
20.0000 mg | ORAL_TABLET | Freq: Every day | ORAL | Status: DC
  Administered 2016-04-12: 20 mg via ORAL
  Filled 2016-04-12: qty 1

## 2016-04-12 MED ORDER — VALACYCLOVIR HCL 500 MG PO TABS: 2000.0000 mg | ORAL_TABLET | Freq: Two times a day (BID) | ORAL | Status: DC | PRN

## 2016-04-12 MED ORDER — LORAZEPAM 1 MG PO TABS
1.0000 mg | ORAL_TABLET | Freq: Two times a day (BID) | ORAL | Status: DC
  Administered 2016-04-12 (×2): 1 mg via ORAL
  Filled 2016-04-12 (×2): qty 1

## 2016-04-12 MED ORDER — BENZTROPINE MESYLATE 0.5 MG PO TABS
0.5000 mg | ORAL_TABLET | Freq: Two times a day (BID) | ORAL | Status: DC
  Administered 2016-04-12 (×2): 0.5 mg via ORAL
  Filled 2016-04-12 (×2): qty 1

## 2016-04-12 MED ORDER — DIVALPROEX SODIUM 125 MG PO CSDR
350.0000 mg | DELAYED_RELEASE_CAPSULE | Freq: Every morning | ORAL | Status: DC
  Administered 2016-04-12: 375 mg via ORAL
  Filled 2016-04-12 (×2): qty 3

## 2016-04-12 MED ORDER — CLONIDINE HCL 0.1 MG/24HR TD PTWK
0.3000 mg | MEDICATED_PATCH | TRANSDERMAL | Status: DC
  Administered 2016-04-12: 0.3 mg via TRANSDERMAL
  Filled 2016-04-12: qty 3

## 2016-04-12 MED ORDER — FERROUS SULFATE 325 (65 FE) MG PO TABS
325.0000 mg | ORAL_TABLET | Freq: Every day | ORAL | Status: DC
  Administered 2016-04-12: 325 mg via ORAL
  Filled 2016-04-12: qty 1

## 2016-04-12 MED ORDER — MECLIZINE HCL 25 MG PO TABS: 25.0000 mg | ORAL_TABLET | Freq: Three times a day (TID) | ORAL | Status: DC | PRN

## 2016-04-12 MED ORDER — VITAMIN D 1000 UNITS PO TABS
1000.0000 [IU] | ORAL_TABLET | Freq: Two times a day (BID) | ORAL | Status: DC
  Administered 2016-04-12 (×2): 1000 [IU] via ORAL
  Filled 2016-04-12 (×2): qty 1

## 2016-04-12 MED ORDER — HYDROCHLOROTHIAZIDE 25 MG PO TABS
25.0000 mg | ORAL_TABLET | Freq: Every day | ORAL | Status: DC
  Administered 2016-04-12: 25 mg via ORAL
  Filled 2016-04-12: qty 1

## 2016-04-12 MED ORDER — QUETIAPINE FUMARATE 25 MG PO TABS
50.0000 mg | ORAL_TABLET | Freq: Three times a day (TID) | ORAL | Status: DC
  Administered 2016-04-12 (×3): 50 mg via ORAL
  Filled 2016-04-12 (×3): qty 2

## 2016-04-12 MED ORDER — BUPROPION HCL ER (XL) 150 MG PO TB24
150.0000 mg | ORAL_TABLET | Freq: Every day | ORAL | Status: DC
  Administered 2016-04-12: 150 mg via ORAL
  Filled 2016-04-12: qty 1

## 2016-04-12 MED ORDER — DIVALPROEX SODIUM 125 MG PO CPSP: 375.0000 mg | ORAL_CAPSULE | Freq: Two times a day (BID) | ORAL | Status: DC

## 2016-04-12 NOTE — ED Notes (Signed)
Walked patient to bathroom.

## 2016-04-12 NOTE — ED Notes (Signed)
Dinner tray given to pt at this time.

## 2016-04-12 NOTE — ED Provider Notes (Addendum)
Patient evaluated at bedside by psychiatry and felt to be stable for outpatient management. Patient stable without any focal deficits at this time. Appropriate for outpatient management.   Hannah Keller Jory Welke, MD 04/12/16 2214    Hannah Keller Akaila Rambo, MD 04/12/16 2215

## 2016-04-12 NOTE — ED Notes (Signed)
Patient awake and provided with breakfast tray
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
## 2016-04-12 NOTE — ED Notes (Signed)
Assisted patient to restroom and helped with change of clean scrubs and underwear. Patient given warm blanket and turned on TV

## 2016-04-12 NOTE — ED Notes (Signed)
Pt. Taking cab home to group home.

## 2016-04-12 NOTE — ED Notes (Signed)
Patient asked for snack nurse Denny PeonKim M   Gave her crackers

## 2016-04-12 NOTE — ED Notes (Signed)
Pt requested crayons so she could color. Pt given 4 crayons and 3 pictures to color. Sitter aware that pt has them.

## 2016-04-12 NOTE — ED Notes (Signed)
Meal was given to patient.. 

## 2016-04-12 NOTE — ED Notes (Signed)
Daily home meds started per verbal order from Dr. Brown.  

## 2016-05-18 ENCOUNTER — Encounter: Payer: Self-pay | Admitting: *Deleted

## 2016-05-18 ENCOUNTER — Emergency Department
Admission: EM | Admit: 2016-05-18 | Discharge: 2016-05-18 | Disposition: A | Discharge status: Home / Self Care | Payer: Medicaid Other | Attending: Emergency Medicine | Admitting: Emergency Medicine

## 2016-05-18 DIAGNOSIS — Z79899 Other long term (current) drug therapy: Secondary | ICD-10-CM | POA: Insufficient documentation

## 2016-05-18 DIAGNOSIS — F79 Unspecified intellectual disabilities: Secondary | ICD-10-CM | POA: Diagnosis not present

## 2016-05-18 DIAGNOSIS — R45851 Suicidal ideations: Secondary | ICD-10-CM | POA: Diagnosis present

## 2016-05-18 DIAGNOSIS — R45850 Homicidal ideations: Secondary | ICD-10-CM | POA: Insufficient documentation

## 2016-05-18 DIAGNOSIS — F71 Moderate intellectual disabilities: Secondary | ICD-10-CM | POA: Diagnosis not present

## 2016-05-18 LAB — CBC
HCT: 36.5 % (ref 35.0–47.0)
Hemoglobin: 12.4 g/dL (ref 12.0–16.0)
MCH: 31.4 pg (ref 26.0–34.0)
MCHC: 34 g/dL (ref 32.0–36.0)
MCV: 92.3 fL (ref 80.0–100.0)
PLATELETS: 211 10*3/uL (ref 150–440)
RBC: 3.95 MIL/uL (ref 3.80–5.20)
RDW: 12.8 % (ref 11.5–14.5)
WBC: 9.5 10*3/uL (ref 3.6–11.0)

## 2016-05-18 LAB — COMPREHENSIVE METABOLIC PANEL
ALBUMIN: 3.7 g/dL (ref 3.5–5.0)
ALT: 11 U/L — ABNORMAL LOW (ref 14–54)
ANION GAP: 7 (ref 5–15)
AST: 25 U/L (ref 15–41)
Alkaline Phosphatase: 50 U/L (ref 38–126)
BILIRUBIN TOTAL: 0.2 mg/dL — AB (ref 0.3–1.2)
BUN: 18 mg/dL (ref 6–20)
CO2: 24 mmol/L (ref 22–32)
Calcium: 9.5 mg/dL (ref 8.9–10.3)
Chloride: 109 mmol/L (ref 101–111)
Creatinine, Ser: 1.34 mg/dL — ABNORMAL HIGH (ref 0.44–1.00)
GFR, EST NON AFRICAN AMERICAN: 54 mL/min — AB (ref >60)
Glucose, Bld: 124 mg/dL — ABNORMAL HIGH (ref 65–99)
POTASSIUM: 3.3 mmol/L — AB (ref 3.5–5.1)
Sodium: 140 mmol/L (ref 135–145)
TOTAL PROTEIN: 7.3 g/dL (ref 6.5–8.1)

## 2016-05-18 LAB — ETHANOL

## 2016-05-18 LAB — ACETAMINOPHEN LEVEL

## 2016-05-18 LAB — SALICYLATE LEVEL

## 2016-05-18 NOTE — ED Notes (Signed)
Pt given graham crackers and a sprite. 
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
## 2016-05-18 NOTE — ED Notes (Signed)
Pt given dinner tray at this time.  
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
## 2016-05-18 NOTE — ED Notes (Addendum)
This RN spoke with Alberteen SpindleEric Palmer [facility] regarding pt discharge at this time.   Alberteen Spindleric Palmer, CSW [phone]

## 2016-05-18 NOTE — ED Notes (Addendum)
This RN spoke with Maureen RalphsVivian from [facility], states she is out of the area at this time and will be returning to the area around 2030 and will come pick up patient.

## 2016-05-18 NOTE — ED Notes (Signed)
Hannah Keller is here to take pt. Back to group home.

## 2016-05-18 NOTE — ED Notes (Signed)
Pt. Going home with Juleen ChinaVivian Timmons.

## 2016-05-18 NOTE — ED Notes (Signed)
Attempted to call Hannah Keller from [facility] [phone], as listed under patient demographics. No answer at this time.
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
## 2016-05-20 ENCOUNTER — Emergency Department
Admission: EM | Admit: 2016-05-20 | Discharge: 2016-05-21 | Disposition: A | Discharge status: Home / Self Care | Payer: Medicaid Other | Attending: Emergency Medicine | Admitting: Emergency Medicine

## 2016-05-20 DIAGNOSIS — R45851 Suicidal ideations: Secondary | ICD-10-CM | POA: Diagnosis present

## 2016-05-20 DIAGNOSIS — Z79899 Other long term (current) drug therapy: Secondary | ICD-10-CM | POA: Insufficient documentation

## 2016-05-20 DIAGNOSIS — F329 Major depressive disorder, single episode, unspecified: Secondary | ICD-10-CM | POA: Insufficient documentation

## 2016-05-20 DIAGNOSIS — F200 Paranoid schizophrenia: Secondary | ICD-10-CM | POA: Insufficient documentation

## 2016-05-20 DIAGNOSIS — F32A Depression, unspecified: Secondary | ICD-10-CM

## 2016-05-20 LAB — CBC
HEMATOCRIT: 38.3 % (ref 35.0–47.0)
HEMOGLOBIN: 12.6 g/dL (ref 12.0–16.0)
MCH: 30.9 pg (ref 26.0–34.0)
MCHC: 33 g/dL (ref 32.0–36.0)
MCV: 93.6 fL (ref 80.0–100.0)
Platelets: 222 10*3/uL (ref 150–440)
RBC: 4.09 MIL/uL (ref 3.80–5.20)
RDW: 12.9 % (ref 11.5–14.5)
WBC: 10.4 10*3/uL (ref 3.6–11.0)

## 2016-05-20 LAB — COMPREHENSIVE METABOLIC PANEL
ALT: 11 U/L — AB (ref 14–54)
ANION GAP: 7 (ref 5–15)
AST: 24 U/L (ref 15–41)
Albumin: 3.8 g/dL (ref 3.5–5.0)
Alkaline Phosphatase: 46 U/L (ref 38–126)
BUN: 16 mg/dL (ref 6–20)
CHLORIDE: 107 mmol/L (ref 101–111)
CO2: 23 mmol/L (ref 22–32)
Calcium: 9.2 mg/dL (ref 8.9–10.3)
Creatinine, Ser: 1.17 mg/dL — ABNORMAL HIGH (ref 0.44–1.00)
GFR calc non Af Amer: >60 mL/min (ref >60)
Glucose, Bld: 123 mg/dL — ABNORMAL HIGH (ref 65–99)
POTASSIUM: 3.6 mmol/L (ref 3.5–5.1)
SODIUM: 137 mmol/L (ref 135–145)
Total Bilirubin: 0.3 mg/dL (ref 0.3–1.2)
Total Protein: 7.3 g/dL (ref 6.5–8.1)

## 2016-05-20 LAB — URINE DRUG SCREEN, QUALITATIVE (ARMC ONLY)
AMPHETAMINES, UR SCREEN: NOT DETECTED
Barbiturates, Ur Screen: NOT DETECTED
Benzodiazepine, Ur Scrn: POSITIVE — AB
Cannabinoid 50 Ng, Ur ~~LOC~~: NOT DETECTED
Cocaine Metabolite,Ur ~~LOC~~: NOT DETECTED
MDMA (ECSTASY) UR SCREEN: NOT DETECTED
METHADONE SCREEN, URINE: NOT DETECTED
OPIATE, UR SCREEN: NOT DETECTED
PHENCYCLIDINE (PCP) UR S: NOT DETECTED
Tricyclic, Ur Screen: POSITIVE — AB

## 2016-05-20 LAB — LITHIUM LEVEL: LITHIUM LVL: 0.5 mmol/L — AB (ref 0.60–1.20)

## 2016-05-20 LAB — ETHANOL: Alcohol, Ethyl (B): <5 mg/dL (ref <5)

## 2016-05-20 LAB — PREGNANCY, URINE: PREG TEST UR: NEGATIVE

## 2016-05-20 MED ORDER — DIVALPROEX SODIUM 125 MG PO CSDR
750.0000 mg | DELAYED_RELEASE_CAPSULE | Freq: Every day | ORAL | Status: DC
  Administered 2016-05-20 – 2016-05-21 (×2): 750 mg via ORAL
  Filled 2016-05-20 (×2): qty 6

## 2016-05-20 MED ORDER — BUPROPION HCL ER (XL) 150 MG PO TB24
150.0000 mg | ORAL_TABLET | Freq: Every day | ORAL | Status: DC
  Administered 2016-05-21: 150 mg via ORAL
  Filled 2016-05-20 (×2): qty 1

## 2016-05-20 MED ORDER — DARIFENACIN HYDROBROMIDE ER 15 MG PO TB24
15.0000 mg | ORAL_TABLET | Freq: Every day | ORAL | Status: DC
  Administered 2016-05-21: 15 mg via ORAL
  Filled 2016-05-20 (×2): qty 1

## 2016-05-20 MED ORDER — BENZTROPINE MESYLATE 0.5 MG PO TABS
0.5000 mg | ORAL_TABLET | Freq: Two times a day (BID) | ORAL | Status: DC
  Administered 2016-05-20 – 2016-05-21 (×3): 0.5 mg via ORAL
  Filled 2016-05-20 (×3): qty 1

## 2016-05-20 MED ORDER — DOCUSATE SODIUM 100 MG PO CAPS
200.0000 mg | ORAL_CAPSULE | Freq: Every day | ORAL | Status: DC
  Administered 2016-05-21: 200 mg via ORAL
  Filled 2016-05-20: qty 2

## 2016-05-20 MED ORDER — DIVALPROEX SODIUM 125 MG PO CPSP: 375.0000 mg | ORAL_CAPSULE | Freq: Two times a day (BID) | ORAL | Status: DC

## 2016-05-20 MED ORDER — DIVALPROEX SODIUM 125 MG PO CSDR
375.0000 mg | DELAYED_RELEASE_CAPSULE | Freq: Every morning | ORAL | Status: DC
  Administered 2016-05-21: 375 mg via ORAL
  Filled 2016-05-20: qty 1

## 2016-05-20 MED ORDER — LORAZEPAM 0.5 MG PO TABS: 0.5000 mg | ORAL_TABLET | Freq: Three times a day (TID) | ORAL | Status: DC | PRN

## 2016-05-20 MED ORDER — ACETAMINOPHEN 325 MG PO TABS: 325.0000 mg | ORAL_TABLET | Freq: Four times a day (QID) | ORAL | Status: DC | PRN

## 2016-05-20 MED ORDER — ASENAPINE MALEATE 10 MG SL SUBL: 1.0000 | SUBLINGUAL_TABLET | Freq: Three times a day (TID) | SUBLINGUAL | Status: DC

## 2016-05-20 MED ORDER — PANTOPRAZOLE SODIUM 40 MG PO TBEC
40.0000 mg | DELAYED_RELEASE_TABLET | Freq: Every day | ORAL | Status: DC
Start: 2016-05-20 — End: 2016-05-22
  Administered 2016-05-21: 40 mg via ORAL
  Filled 2016-05-20: qty 1

## 2016-05-20 MED ORDER — ASENAPINE MALEATE 5 MG SL SUBL
10.0000 mg | SUBLINGUAL_TABLET | Freq: Three times a day (TID) | SUBLINGUAL | Status: DC
  Administered 2016-05-20 – 2016-05-21 (×4): 10 mg via SUBLINGUAL
  Filled 2016-05-20 (×4): qty 2

## 2016-05-20 MED ORDER — VITAMIN D 1000 UNITS PO TABS
1000.0000 [IU] | ORAL_TABLET | Freq: Two times a day (BID) | ORAL | Status: DC
  Administered 2016-05-20 – 2016-05-21 (×3): 1000 [IU] via ORAL
  Filled 2016-05-20 (×3): qty 1

## 2016-05-20 MED ORDER — NALTREXONE HCL 50 MG PO TABS
50.0000 mg | ORAL_TABLET | Freq: Every day | ORAL | Status: DC
Start: 2016-05-20 — End: 2016-05-22
  Administered 2016-05-21: 50 mg via ORAL
  Filled 2016-05-20: qty 1

## 2016-05-20 MED ORDER — QUETIAPINE FUMARATE 25 MG PO TABS
50.0000 mg | ORAL_TABLET | Freq: Three times a day (TID) | ORAL | Status: DC
  Administered 2016-05-20 – 2016-05-21 (×4): 50 mg via ORAL
  Filled 2016-05-20 (×4): qty 2

## 2016-05-20 MED ORDER — HYDROCHLOROTHIAZIDE 25 MG PO TABS
25.0000 mg | ORAL_TABLET | Freq: Every day | ORAL | Status: DC
  Administered 2016-05-21: 25 mg via ORAL
  Filled 2016-05-20: qty 1

## 2016-05-20 MED ORDER — PAROXETINE HCL 20 MG PO TABS
20.0000 mg | ORAL_TABLET | Freq: Every day | ORAL | Status: DC
  Administered 2016-05-21: 20 mg via ORAL
  Filled 2016-05-20: qty 1

## 2016-05-20 MED ORDER — ARIPIPRAZOLE 5 MG PO TABS
15.0000 mg | ORAL_TABLET | Freq: Every day | ORAL | Status: DC
  Administered 2016-05-21: 15 mg via ORAL
  Filled 2016-05-20: qty 3

## 2016-05-20 MED ORDER — FERROUS SULFATE 325 (65 FE) MG PO TABS
325.0000 mg | ORAL_TABLET | Freq: Every day | ORAL | Status: DC
  Administered 2016-05-21: 325 mg via ORAL
  Filled 2016-05-20: qty 1

## 2016-05-20 MED ORDER — LITHIUM CARBONATE 300 MG PO CAPS
600.0000 mg | ORAL_CAPSULE | Freq: Every day | ORAL | Status: DC
  Administered 2016-05-20: 600 mg via ORAL
  Filled 2016-05-20: qty 2

## 2016-05-20 NOTE — ED Notes (Signed)
Called and initiated consult with SOC spoke to [place] 1400
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
## 2016-05-20 NOTE — ED Notes (Signed)
ENVIRONMENTAL ASSESSMENT  Potentially harmful objects out of patient reach: Yes.  Personal belongings secured: Yes.  Patient dressed in hospital provided attire only: Yes.  Plastic bags out of patient reach: Yes.  Patient care equipment (cords, cables, call bells, lines, and drains) shortened, removed, or accounted for: Yes.  Equipment and supplies removed from bottom of stretcher: Yes.  Potentially toxic materials out of patient reach: Yes.  Sharps container removed or out of patient reach: Yes.   BEHAVIORAL HEALTH ROUNDING Patient sleeping: Yes.   Patient alert and oriented: not applicable SLEEPING Behavior appropriate: Yes.  ; If no, describe: SLEEPING Nutrition and fluids offered: No SLEEPING Toileting and hygiene offered: NoSLEEPING Sitter present: not applicable, Q 15 min safety rounds and observation via security camera. Law enforcement present: Yes ODS  

## 2016-05-20 NOTE — ED Notes (Addendum)
Pt given snack and ginger ale 
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
## 2016-05-20 NOTE — ED Notes (Signed)
BEHAVIORAL HEALTH ROUNDING Patient sleeping: Yes.   Patient alert and oriented: not applicable SLEEPING Behavior appropriate: Yes.  ; If no, describe: SLEEPING Nutrition and fluids offered: No SLEEPING Toileting and hygiene offered: NoSLEEPING Sitter present: not applicable, Q 15 min safety rounds and observation. Law enforcement present: Yes ODS 
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
## 2016-05-21 MED ORDER — DIVALPROEX SODIUM 125 MG PO CSDR
DELAYED_RELEASE_CAPSULE | ORAL | Status: AC
Start: 2016-05-21 — End: 2016-05-21
  Administered 2016-05-21: 375 mg via ORAL
  Filled 2016-05-21: qty 2

## 2016-05-21 NOTE — ED Notes (Signed)
BEHAVIORAL HEALTH ROUNDING  Patient sleeping: No.  Patient alert and oriented: yes  Behavior appropriate: Yes. ; If no, describe:  Nutrition and fluids offered: Yes  Toileting and hygiene offered: Yes  Sitter present: not applicable, Q 15 min safety rounds and observation.  Law enforcement present: Yes ODS  

## 2016-05-21 NOTE — ED Notes (Signed)
BEHAVIORAL HEALTH ROUNDING Patient sleeping: Yes.   Patient alert and oriented: not applicable SLEEPING Behavior appropriate: Yes.  ; If no, describe: SLEEPING Nutrition and fluids offered: No SLEEPING Toileting and hygiene offered: NoSLEEPING Sitter present: not applicable, Q 15 min safety rounds and observation. Law enforcement present: Yes ODS 

## 2016-05-21 NOTE — ED Notes (Addendum)
SOC given to MD Donato SchultzQuiggley. Pt to be discharged.  Pts group home owner informed. Pts guardian called, no answer.
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
## 2016-05-21 NOTE — ED Notes (Signed)
Hannah RalphsVivian, group home owner, called and informed this RN she was stuck in traffic.  ETA 2130.
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
## 2016-05-21 NOTE — Discharge Instructions (Signed)
Please return immediately if condition worsens. Please contact her primary physician or the physician you were given for referral. If you have any specialist physicians involved in her treatment and plan please also contact them. Thank you for using [facility]. ° °

## 2016-05-21 NOTE — ED Provider Notes (Signed)
-----------------------------------------   7:48 PM on 05/21/2016 -----------------------------------------   Blood pressure 112/77, pulse 89, temperature 98.1 F (36.7 C), temperature source Oral, resp. rate 18, height 5' (1.524 m), weight 170 lb (77.1 kg), SpO2 100 %.  Assuming care from Dr. Marchelle FolksSchaevit.  In short, Hannah Keller is a 25 y.o. female with a chief complaint of Suicidal and Aggressive Behavior .  Refer to the original H&P for additional details. Patient cleared by SOC/ Psychiatry The current plan of care is to *discharge the patient home. Also see shows that the patient has had her IVC paperwork rescinded and was cleared for discharge home to a group facility. Continue all of her current medications.    Jennye MoccasinBrian S Quigley, MD 05/21/16 [phone]

## 2016-05-21 NOTE — ED Provider Notes (Signed)
-----------------------------------------   7:11 AM on 05/21/2016 -----------------------------------------   Blood pressure 112/77, pulse 89, temperature 98.1 F (36.7 C), temperature source Oral, resp. rate 18, height 5' (1.524 m), weight 77.1 kg, SpO2 100 %.  The patient had no acute events since last update.  Calm and cooperative at this time.  Disposition is pending Psychiatry/Behavioral Medicine team recommendations.     Loleta Roseory Sneha Willig, MD 05/21/16 [phone]

## 2016-05-21 NOTE — ED Notes (Signed)
Attempt to contact group home owner, left voicemail.

## 2016-05-21 NOTE — ED Notes (Signed)
Rescinded IVC pt now VOL

## 2016-05-22 ENCOUNTER — Observation Stay
Admission: EM | Admit: 2016-05-22 | Discharge: 2016-05-24 | Disposition: A | Discharge status: Home / Self Care | Payer: Medicaid Other | Attending: Internal Medicine | Admitting: Internal Medicine

## 2016-05-22 ENCOUNTER — Emergency Department: Payer: Medicaid Other

## 2016-05-22 ENCOUNTER — Encounter: Payer: Self-pay | Admitting: Emergency Medicine

## 2016-05-22 DIAGNOSIS — E876 Hypokalemia: Secondary | ICD-10-CM | POA: Insufficient documentation

## 2016-05-22 DIAGNOSIS — H5500 Unspecified nystagmus: Secondary | ICD-10-CM | POA: Insufficient documentation

## 2016-05-22 DIAGNOSIS — Z888 Allergy status to other drugs, medicaments and biological substances status: Secondary | ICD-10-CM | POA: Insufficient documentation

## 2016-05-22 DIAGNOSIS — G9340 Encephalopathy, unspecified: Secondary | ICD-10-CM | POA: Diagnosis not present

## 2016-05-22 DIAGNOSIS — I441 Atrioventricular block, second degree: Secondary | ICD-10-CM | POA: Diagnosis not present

## 2016-05-22 DIAGNOSIS — F200 Paranoid schizophrenia: Secondary | ICD-10-CM | POA: Diagnosis not present

## 2016-05-22 DIAGNOSIS — F71 Moderate intellectual disabilities: Secondary | ICD-10-CM | POA: Diagnosis not present

## 2016-05-22 DIAGNOSIS — H518 Other specified disorders of binocular movement: Secondary | ICD-10-CM | POA: Diagnosis not present

## 2016-05-22 DIAGNOSIS — G2402 Drug induced acute dystonia: Secondary | ICD-10-CM

## 2016-05-22 DIAGNOSIS — G2409 Other drug induced dystonia: Secondary | ICD-10-CM | POA: Diagnosis not present

## 2016-05-22 DIAGNOSIS — Z23 Encounter for immunization: Secondary | ICD-10-CM | POA: Insufficient documentation

## 2016-05-22 DIAGNOSIS — R569 Unspecified convulsions: Secondary | ICD-10-CM

## 2016-05-22 LAB — URINALYSIS COMPLETE WITH MICROSCOPIC (ARMC ONLY)
BACTERIA UA: NONE SEEN
BILIRUBIN URINE: NEGATIVE
GLUCOSE, UA: NEGATIVE mg/dL
Hgb urine dipstick: NEGATIVE
Ketones, ur: NEGATIVE mg/dL
Leukocytes, UA: NEGATIVE
Nitrite: NEGATIVE
Protein, ur: NEGATIVE mg/dL
Specific Gravity, Urine: 1.017 (ref 1.005–1.030)
pH: 7 (ref 5.0–8.0)

## 2016-05-22 LAB — CBC WITH DIFFERENTIAL/PLATELET
BASOS ABS: 0.1 10*3/uL (ref 0–0.1)
Basophils Relative: 0 %
EOS PCT: 3 %
Eosinophils Absolute: 0.4 10*3/uL (ref 0–0.7)
HEMATOCRIT: 38 % (ref 35.0–47.0)
Hemoglobin: 12.8 g/dL (ref 12.0–16.0)
LYMPHS ABS: 3.3 10*3/uL (ref 1.0–3.6)
LYMPHS PCT: 24 %
MCH: 31.3 pg (ref 26.0–34.0)
MCHC: 33.7 g/dL (ref 32.0–36.0)
MCV: 92.7 fL (ref 80.0–100.0)
MONO ABS: 0.8 10*3/uL (ref 0.2–0.9)
Monocytes Relative: 6 %
NEUTROS ABS: 9.4 10*3/uL — AB (ref 1.4–6.5)
Neutrophils Relative %: 67 %
PLATELETS: 227 10*3/uL (ref 150–440)
RBC: 4.1 MIL/uL (ref 3.80–5.20)
RDW: 13.1 % (ref 11.5–14.5)
WBC: 13.9 10*3/uL — ABNORMAL HIGH (ref 3.6–11.0)

## 2016-05-22 LAB — VALPROIC ACID LEVEL: VALPROIC ACID LVL: 57 ug/mL (ref 50.0–100.0)

## 2016-05-22 LAB — COMPREHENSIVE METABOLIC PANEL
ALT: 11 U/L — AB (ref 14–54)
AST: 24 U/L (ref 15–41)
Albumin: 3.9 g/dL (ref 3.5–5.0)
Alkaline Phosphatase: 51 U/L (ref 38–126)
Anion gap: 7 (ref 5–15)
BILIRUBIN TOTAL: 0.7 mg/dL (ref 0.3–1.2)
BUN: 15 mg/dL (ref 6–20)
CHLORIDE: 106 mmol/L (ref 101–111)
CO2: 27 mmol/L (ref 22–32)
CREATININE: 0.97 mg/dL (ref 0.44–1.00)
Calcium: 9.5 mg/dL (ref 8.9–10.3)
Glucose, Bld: 102 mg/dL — ABNORMAL HIGH (ref 65–99)
Potassium: 3.8 mmol/L (ref 3.5–5.1)
Sodium: 140 mmol/L (ref 135–145)
TOTAL PROTEIN: 7.7 g/dL (ref 6.5–8.1)

## 2016-05-22 LAB — URINE DRUG SCREEN, QUALITATIVE (ARMC ONLY)
AMPHETAMINES, UR SCREEN: NOT DETECTED
BARBITURATES, UR SCREEN: NOT DETECTED
Benzodiazepine, Ur Scrn: POSITIVE — AB
COCAINE METABOLITE, UR ~~LOC~~: NOT DETECTED
Cannabinoid 50 Ng, Ur ~~LOC~~: NOT DETECTED
MDMA (ECSTASY) UR SCREEN: NOT DETECTED
METHADONE SCREEN, URINE: NOT DETECTED
OPIATE, UR SCREEN: NOT DETECTED
Phencyclidine (PCP) Ur S: NOT DETECTED
TRICYCLIC, UR SCREEN: NOT DETECTED

## 2016-05-22 LAB — TROPONIN I

## 2016-05-22 LAB — LITHIUM LEVEL: LITHIUM LVL: 0.25 mmol/L — AB (ref 0.60–1.20)

## 2016-05-22 MED ORDER — DIPHENHYDRAMINE HCL 25 MG PO CAPS
ORAL_CAPSULE | ORAL | Status: AC
  Administered 2016-05-22: 25 mg via ORAL
  Filled 2016-05-22: qty 1

## 2016-05-22 MED ORDER — SODIUM CHLORIDE 0.9 % IV BOLUS (SEPSIS)
1000.0000 mL | Freq: Once | INTRAVENOUS | Status: AC
  Administered 2016-05-22: 1000 mL via INTRAVENOUS

## 2016-05-22 MED ORDER — DIPHENHYDRAMINE HCL 25 MG PO CAPS
25.0000 mg | ORAL_CAPSULE | Freq: Once | ORAL | Status: AC
  Administered 2016-05-22: 25 mg via ORAL

## 2016-05-22 MED ORDER — IOPAMIDOL (ISOVUE-370) INJECTION 76%
75.0000 mL | Freq: Once | INTRAVENOUS | Status: AC | PRN
  Administered 2016-05-22: 75 mL via INTRAVENOUS
  Filled 2016-05-22: qty 75

## 2016-05-22 NOTE — H&P (Signed)
[facility] [place] NAME: Hannah Keller    MR#:  [id number]  DATE OF BIRTH:  Sep 18, 1990  DATE OF ADMISSION:  05/22/2016  PRIMARY CARE PHYSICIAN: Vista Mink, FNP   REQUESTING/REFERRING PHYSICIAN: Clearnce Hasten, MD  CHIEF COMPLAINT:   Chief Complaint  Patient presents with  . Neck Pain  . Leg Pain    HISTORY OF PRESENT ILLNESS:  Hannah Keller  is a 25 y.o. female who presents with A myriad of neurologic symptoms. She is a poor historian and is difficult to get a complete story from her, additional information is taken from ED physician who spoke with her caregivers.  Reportedly she began having some extremity numbness today, has been having some eye twitching as well as some lower extremity fasciculations, she had some weakness in her extremities as well. Here in the ED telemetry neurology saw the patient and felt like she might be having oculogyric crisis given that she is on a number of antipsychotics. Hospitalists were called for admission and further evaluation and treatment.  PAST MEDICAL HISTORY:   Past Medical History:  Diagnosis Date  . Developmental delay   . Mental retardation    Mild  . Paranoid schizophrenia ([place])   . Seizures ([place])     PAST SURGICAL HISTORY:   Past Surgical History:  Procedure Laterality Date  . ESOPHAGOGASTRODUODENOSCOPY (EGD) WITH PROPOFOL N/A 05/18/2015   Procedure: ESOPHAGOGASTRODUODENOSCOPY (EGD) WITH PROPOFOL;  Surgeon: Lollie Sails, MD;  Location: [facility] ENDOSCOPY;  Service: Endoscopy;  Laterality: N/A;    SOCIAL HISTORY:   Social History  Substance Use Topics  . Smoking status: Never Smoker  . Smokeless tobacco: Never Used  . Alcohol use No    FAMILY HISTORY:   Family History  Problem Relation Age of Onset  . Family history unknown: Yes    DRUG ALLERGIES:   Allergies  Allergen Reactions  . Ritalin [Methylphenidate Hcl] Other (See Comments)    Reaction:   Unknown     MEDICATIONS AT HOME:   Prior to Admission medications   Medication Sig Start Date End Date Taking? Authorizing Provider  acetaminophen (TYLENOL) 325 MG tablet Take 325 mg by mouth every 6 (six) hours as needed for mild pain, fever or headache.    Historical Provider, MD  ARIPiprazole (ABILIFY) 15 MG tablet Take 15 mg by mouth daily at 12 noon.    Historical Provider, MD  Asenapine Maleate (SAPHRIS) 10 MG SUBL Place 1 tablet under the tongue 3 (three) times daily.     Historical Provider, MD  benztropine (COGENTIN) 0.5 MG tablet Take 0.5 mg by mouth 2 (two) times daily.     Historical Provider, MD  buPROPion (WELLBUTRIN XL) 150 MG 24 hr tablet Take 150 mg by mouth daily.    Historical Provider, MD  cholecalciferol (VITAMIN D) 1000 UNITS tablet Take 1,000 Units by mouth 2 (two) times daily.    Historical Provider, MD  cloNIDine (CATAPRES - DOSED IN MG/24 HR) 0.3 mg/24hr patch Place 0.3 mg onto the skin once a week. Pt applies on Friday.    Historical Provider, MD  divalproex (DEPAKOTE SPRINKLE) 125 MG capsule Take 375-750 mg by mouth 2 (two) times daily. 375m in the morning and 7556mat night    Historical Provider, MD  docusate sodium (COLACE) 100 MG capsule Take 200 mg by mouth daily.     Historical Provider, MD  ferrous sulfate 325 (65 FE) MG tablet Take 325 mg by mouth daily.  Historical Provider, MD  hydrochlorothiazide (HYDRODIURIL) 25 MG tablet Take 25 mg by mouth daily.    Historical Provider, MD  lithium carbonate 300 MG capsule Take 1 capsule (300 mg total) by mouth at bedtime. Patient taking differently: Take 600 mg by mouth at bedtime.  04/06/15   Epifanio Lesches, MD  LORazepam (ATIVAN) 0.5 MG tablet Take 0.5 mg by mouth every 8 (eight) hours as needed for anxiety.    Historical Provider, MD  LORazepam (ATIVAN) 1 MG tablet Take 1 mg by mouth 2 (two) times daily.    Historical Provider, MD  meclizine (ANTIVERT) 25 MG tablet Take 25 mg by mouth 3 (three) times daily as  needed for dizziness.    Historical Provider, MD  medroxyPROGESTERone (DEPO-PROVERA) 150 MG/ML injection Inject 150 mg into the muscle every 3 (three) months.    Historical Provider, MD  naltrexone (DEPADE) 50 MG tablet Take 50 mg by mouth daily.    Historical Provider, MD  Olopatadine HCl (PATADAY) 0.2 % SOLN Apply 1 drop to eye daily as needed (for allergies).     Historical Provider, MD  omeprazole (PRILOSEC) 20 MG capsule Take 20 mg by mouth daily.    Historical Provider, MD  PARoxetine (PAXIL) 20 MG tablet Take 20 mg by mouth daily.    Historical Provider, MD  polyethylene glycol (MIRALAX / GLYCOLAX) packet Take 17 g by mouth every other day.    Historical Provider, MD  QUEtiapine (SEROQUEL) 50 MG tablet Take 50 mg by mouth 3 (three) times daily.    Historical Provider, MD  solifenacin (VESICARE) 10 MG tablet Take 10 mg by mouth daily.    Historical Provider, MD  valACYclovir (VALTREX) 1000 MG tablet Take 2,000 mg by mouth 2 (two) times daily as needed (for fever blisters).    Historical Provider, MD    REVIEW OF SYSTEMS:  Review of Systems  Constitutional: Negative for chills, fever, malaise/fatigue and weight loss.  HENT: Negative for ear pain, hearing loss and tinnitus.   Eyes: Negative for blurred vision, double vision, pain and redness.  Respiratory: Negative for cough, hemoptysis and shortness of breath.   Cardiovascular: Negative for chest pain, palpitations, orthopnea and leg swelling.  Gastrointestinal: Negative for abdominal pain, constipation, diarrhea, nausea and vomiting.  Genitourinary: Negative for dysuria, frequency and hematuria.  Musculoskeletal: Negative for back pain, joint pain and neck pain.  Skin:       No acne, rash, or lesions  Neurological: Positive for tremors and sensory change. Negative for dizziness, focal weakness and weakness.  Endo/Heme/Allergies: Negative for polydipsia. Does not bruise/bleed easily.  Psychiatric/Behavioral: Negative for depression.  The patient is not nervous/anxious and does not have insomnia.      VITAL SIGNS:   Vitals:   05/22/16 2029 05/22/16 2105 05/22/16 2200 05/22/16 2304  BP: 130/77 124/79 115/63 112/77  Pulse: 94 98 90 90  Resp: 18 18 18 18   Temp:      TempSrc:      SpO2: 100% 99% 98% 100%  Weight:      Height:       Wt Readings from Last 3 Encounters:  05/22/16 72.6 kg (160 lb)  05/20/16 77.1 kg (170 lb)  05/18/16 77.1 kg (170 lb)    PHYSICAL EXAMINATION:  Physical Exam  Vitals reviewed. Constitutional: She is oriented to person, place, and time. She appears well-developed and well-nourished. No distress.  HENT:  Head: Normocephalic and atraumatic.  Mouth/Throat: Oropharynx is clear and moist.  Eyes: Conjunctivae are normal. Pupils  are equal, round, and reactive to light. No scleral icterus.  Oculogyric nystagmus  Neck: Normal range of motion. Neck supple. No JVD present. No thyromegaly present.  Cardiovascular: Normal rate, regular rhythm and intact distal pulses.  Exam reveals no gallop and no friction rub.   No murmur heard. Respiratory: Effort normal and breath sounds normal. No respiratory distress. She has no wheezes. She has no rales.  GI: Soft. Bowel sounds are normal. She exhibits no distension. There is no tenderness.  Musculoskeletal: Normal range of motion. She exhibits no edema.  Lower extremity fine tremor  Lymphadenopathy:    She has no cervical adenopathy.  Neurological: She is alert and oriented to person, place, and time. No cranial nerve deficit.  No dysarthria, no aphasia  Skin: Skin is warm and dry. No rash noted. No erythema.  Psychiatric: She has a normal mood and affect. Her behavior is normal. Judgment and thought content normal.    LABORATORY PANEL:   CBC  Recent Labs Lab 05/22/16 1824  WBC 13.9*  HGB 12.8  HCT 38.0  PLT 227    ------------------------------------------------------------------------------------------------------------------  Chemistries   Recent Labs Lab 05/22/16 1824  NA 140  K 3.8  CL 106  CO2 27  GLUCOSE 102*  BUN 15  CREATININE 0.97  CALCIUM 9.5  AST 24  ALT 11*  ALKPHOS 51  BILITOT 0.7   ------------------------------------------------------------------------------------------------------------------  Cardiac Enzymes  Recent Labs Lab 05/22/16 1824  TROPONINI <0.03   ------------------------------------------------------------------------------------------------------------------  RADIOLOGY:  Ct Angio Head W Or Wo Contrast  Result Date: 05/22/2016 CLINICAL DATA:  25 y/o F; right neck, fine, and foot pain. Status post fall last evening. Additional complaints of tremors to the right leg and numbness of right arm with nystagmus. EXAM: CT ANGIOGRAPHY HEAD AND NECK TECHNIQUE: Multidetector CT imaging of the head and neck was performed using the standard protocol during bolus administration of intravenous contrast. Multiplanar CT image reconstructions and MIPs were obtained to evaluate the vascular anatomy. Carotid stenosis measurements (when applicable) are obtained utilizing NASCET criteria, using the distal internal carotid diameter as the denominator. CONTRAST:  75 cc Isovue 370 COMPARISON:  05/22/2016 CT of the head. FINDINGS: CT HEAD FINDINGS Brain: No evidence of acute infarction, hemorrhage, hydrocephalus, extra-axial collection or mass lesion/mass effect. Vascular: See below. Skull: Negative for fracture. Extensive hyperostosis frontalis interna. Sinuses: Imaged portions are clear. Orbits: No acute finding. Review of the MIP images confirms the above findings CTA NECK FINDINGS Aortic arch: Standard branching. Imaged portion shows no evidence of aneurysm or dissection. No significant stenosis of the major arch vessel origins. Right carotid system: No evidence of dissection,  stenosis (50% or greater) or occlusion. Left carotid system: No evidence of dissection, stenosis (50% or greater) or occlusion. Vertebral arteries: Codominant. No evidence of dissection, stenosis (50% or greater) or occlusion. Skeleton: Mild cervical spondylosis greatest at the C5-6 level with there are endplate degenerative changes a large anterior marginal osteophytes. Other neck: Negative. Upper chest: Clear Review of the MIP images confirms the above findings CTA HEAD FINDINGS Anterior circulation: No significant stenosis, proximal occlusion, aneurysm, or vascular malformation. Posterior circulation: No significant stenosis, proximal occlusion, aneurysm, or vascular malformation. Venous sinuses: As permitted by contrast timing, patent. Anatomic variants: Bilateral fetal PCA. The right vertebral artery largely terminates in the right PICC up with minimal contribution to basilar. Delayed phase: No abnormal intracranial enhancement. Review of the MIP images confirms the above findings IMPRESSION: 1. No acute intracranial abnormality is identified. If symptoms persist or if  clinically indicated MRI is more sensitive for acute stroke. 2. No evidence for dissection significant stenosis or occlusion of the carotid and vertebral arteries of the neck. 3. No significant stenosis, proximal occlusion, aneurysm, or vascular malformation of the circle of Brienne Liguori. Electronically Signed   By: Kristine Garbe M.D.   On: 05/22/2016 19:17   Ct Angio Neck W And/or Wo Contrast  Result Date: 05/22/2016 CLINICAL DATA:  25 y/o F; right neck, fine, and foot pain. Status post fall last evening. Additional complaints of tremors to the right leg and numbness of right arm with nystagmus. EXAM: CT ANGIOGRAPHY HEAD AND NECK TECHNIQUE: Multidetector CT imaging of the head and neck was performed using the standard protocol during bolus administration of intravenous contrast. Multiplanar CT image reconstructions and MIPs were  obtained to evaluate the vascular anatomy. Carotid stenosis measurements (when applicable) are obtained utilizing NASCET criteria, using the distal internal carotid diameter as the denominator. CONTRAST:  75 cc Isovue 370 COMPARISON:  05/22/2016 CT of the head. FINDINGS: CT HEAD FINDINGS Brain: No evidence of acute infarction, hemorrhage, hydrocephalus, extra-axial collection or mass lesion/mass effect. Vascular: See below. Skull: Negative for fracture. Extensive hyperostosis frontalis interna. Sinuses: Imaged portions are clear. Orbits: No acute finding. Review of the MIP images confirms the above findings CTA NECK FINDINGS Aortic arch: Standard branching. Imaged portion shows no evidence of aneurysm or dissection. No significant stenosis of the major arch vessel origins. Right carotid system: No evidence of dissection, stenosis (50% or greater) or occlusion. Left carotid system: No evidence of dissection, stenosis (50% or greater) or occlusion. Vertebral arteries: Codominant. No evidence of dissection, stenosis (50% or greater) or occlusion. Skeleton: Mild cervical spondylosis greatest at the C5-6 level with there are endplate degenerative changes a large anterior marginal osteophytes. Other neck: Negative. Upper chest: Clear Review of the MIP images confirms the above findings CTA HEAD FINDINGS Anterior circulation: No significant stenosis, proximal occlusion, aneurysm, or vascular malformation. Posterior circulation: No significant stenosis, proximal occlusion, aneurysm, or vascular malformation. Venous sinuses: As permitted by contrast timing, patent. Anatomic variants: Bilateral fetal PCA. The right vertebral artery largely terminates in the right PICC up with minimal contribution to basilar. Delayed phase: No abnormal intracranial enhancement. Review of the MIP images confirms the above findings IMPRESSION: 1. No acute intracranial abnormality is identified. If symptoms persist or if clinically indicated MRI  is more sensitive for acute stroke. 2. No evidence for dissection significant stenosis or occlusion of the carotid and vertebral arteries of the neck. 3. No significant stenosis, proximal occlusion, aneurysm, or vascular malformation of the circle of Millie Forde. Electronically Signed   By: Kristine Garbe M.D.   On: 05/22/2016 19:17    EKG:   Orders placed or performed during the hospital encounter of 05/22/16  . ED EKG  . ED EKG  . EKG 12-Lead  . 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 Neurology and psychiatry consults Active Problems:   Paranoid schizophrenia ([place]) - continue some home meds, but await for psychiatry and neurology recommendations before restarting any antipsychotics   Seizures ([place]) - continue home meds, check valproic acid level  All the records are reviewed and case discussed with ED provider. Management plans discussed with the patient and/or family.  DVT PROPHYLAXIS: SubQ lovenox  GI PROPHYLAXIS: None  ADMISSION STATUS: Observation  CODE STATUS: Full Code Status History    Date Active Date Inactive Code Status Order ID Comments User Context   03/29/2015  7:52 PM 04/06/2015  4:08 PM Full Code [id number]  Henreitta Leber, MD ED      TOTAL TIME TAKING CARE OF THIS PATIENT: 40 minutes.    Jeanelle Dake Clay 05/22/2016, 11:30 PM  [facility]  Office  [phone]  CC: Primary care physician; Vista Mink, FNP

## 2016-05-22 NOTE — ED Notes (Signed)
Pts caregiver leaving at this time.

## 2016-05-22 NOTE — ED Notes (Signed)
SOC with neurology completed.

## 2016-05-22 NOTE — ED Provider Notes (Signed)
[facility] Emergency Department Provider Note   ____________________________________________   First MD Initiated Contact with Patient 05/22/16 1705     (approximate)  I have reviewed the triage vital signs and the nursing notes.   HISTORY  Chief Complaint Neck Pain and Leg Pain   HPI Leen Tworek is a 25 y.o. female with a history of mental retardation and schizophrenia who is presenting to the emergency department today after a fall. Per her caregiver was at her bedside the patient was getting up from the table after eating when she had flaccidity and pain to her left arm and leg. She was falling to the ground but was caught by staff. She did not have any impact to the ground. However, since the fall she has been complaining of intermittent right arm numbness as well as neck pain. The caregivers also concerned because of "twitching" to her eyes which she says is common for the patient becomes agitated. However, she says that the patient does not usually have numbness before becoming agitated. The patient denies any suicidal or homicidal ideation at this time.   Past Medical History:  Diagnosis Date  . Developmental delay   . Mental retardation    Mild  . Paranoid schizophrenia (HCC)   . Seizures [facility])     Patient Active Problem List   Diagnosis Date Noted  . Paranoid schizophrenia (HCC) 04/12/2016  . Acute renal failure syndrome (HCC)   . AV block, Mobitz II   . Sinus pause   . Lithium toxicity 03/29/2015  . Mental retardation, idiopathic moderate 12/01/2014    Past Surgical History:  Procedure Laterality Date  . ESOPHAGOGASTRODUODENOSCOPY (EGD) WITH PROPOFOL N/A 05/18/2015   Procedure: ESOPHAGOGASTRODUODENOSCOPY (EGD) WITH PROPOFOL;  Surgeon: Christena Deem, MD;  Location: [facility] ENDOSCOPY;  Service: Endoscopy;  Laterality: N/A;    Prior to Admission medications   Medication Sig Start Date End Date Taking? Authorizing Provider    acetaminophen (TYLENOL) 325 MG tablet Take 325 mg by mouth every 6 (six) hours as needed for mild pain, fever or headache.    Historical Provider, MD  ARIPiprazole (ABILIFY) 15 MG tablet Take 15 mg by mouth daily at 12 noon.    Historical Provider, MD  Asenapine Maleate (SAPHRIS) 10 MG SUBL Place 1 tablet under the tongue 3 (three) times daily.     Historical Provider, MD  benztropine (COGENTIN) 0.5 MG tablet Take 0.5 mg by mouth 2 (two) times daily.     Historical Provider, MD  buPROPion (WELLBUTRIN XL) 150 MG 24 hr tablet Take 150 mg by mouth daily.    Historical Provider, MD  cholecalciferol (VITAMIN D) 1000 UNITS tablet Take 1,000 Units by mouth 2 (two) times daily.    Historical Provider, MD  cloNIDine (CATAPRES - DOSED IN MG/24 HR) 0.3 mg/24hr patch Place 0.3 mg onto the skin once a week. Pt applies on Friday.    Historical Provider, MD  divalproex (DEPAKOTE SPRINKLE) 125 MG capsule Take 375-750 mg by mouth 2 (two) times daily. 375mg  in the morning and 750mg  at night    Historical Provider, MD  docusate sodium (COLACE) 100 MG capsule Take 200 mg by mouth daily.     Historical Provider, MD  ferrous sulfate 325 (65 FE) MG tablet Take 325 mg by mouth daily.    Historical Provider, MD  hydrochlorothiazide (HYDRODIURIL) 25 MG tablet Take 25 mg by mouth daily.    Historical Provider, MD  lithium carbonate 300 MG capsule Take 1 capsule (  300 mg total) by mouth at bedtime. Patient taking differently: Take 600 mg by mouth at bedtime.  04/06/15   Katha Hamming, MD  LORazepam (ATIVAN) 0.5 MG tablet Take 0.5 mg by mouth every 8 (eight) hours as needed for anxiety.    Historical Provider, MD  LORazepam (ATIVAN) 1 MG tablet Take 1 mg by mouth 2 (two) times daily.    Historical Provider, MD  meclizine (ANTIVERT) 25 MG tablet Take 25 mg by mouth 3 (three) times daily as needed for dizziness.    Historical Provider, MD  medroxyPROGESTERone (DEPO-PROVERA) 150 MG/ML injection Inject 150 mg into the muscle  every 3 (three) months.    Historical Provider, MD  naltrexone (DEPADE) 50 MG tablet Take 50 mg by mouth daily.    Historical Provider, MD  Olopatadine HCl (PATADAY) 0.2 % SOLN Apply 1 drop to eye daily as needed (for allergies).     Historical Provider, MD  omeprazole (PRILOSEC) 20 MG capsule Take 20 mg by mouth daily.    Historical Provider, MD  PARoxetine (PAXIL) 20 MG tablet Take 20 mg by mouth daily.    Historical Provider, MD  polyethylene glycol (MIRALAX / GLYCOLAX) packet Take 17 g by mouth every other day.    Historical Provider, MD  QUEtiapine (SEROQUEL) 50 MG tablet Take 50 mg by mouth 3 (three) times daily.    Historical Provider, MD  solifenacin (VESICARE) 10 MG tablet Take 10 mg by mouth daily.    Historical Provider, MD  valACYclovir (VALTREX) 1000 MG tablet Take 2,000 mg by mouth 2 (two) times daily as needed (for fever blisters).    Historical Provider, MD    Allergies Ritalin [methylphenidate hcl]  No family history on file.  Social History Social History  Substance Use Topics  . Smoking status: Never Smoker  . Smokeless tobacco: Never Used  . Alcohol use No    Review of Systems Constitutional: No fever/chills Eyes: No visual changes. ENT: No sore throat. Cardiovascular: Denies chest pain. Respiratory: Denies shortness of breath. Gastrointestinal: No abdominal pain.  No nausea, no vomiting.  No diarrhea.  No constipation. Genitourinary: Negative for dysuria. Musculoskeletal: Negative for back pain. Skin: Negative for rash. Neurological: Negative for headaches, focal weakness   10-point ROS otherwise negative.  ____________________________________________   PHYSICAL EXAM:  VITAL SIGNS: ED Triage Vitals [05/22/16 1413]  Enc Vitals Group     BP      Pulse      Resp      Temp      Temp src      SpO2      Weight 160 lb (72.6 kg)     Height 5\' 2"  (1.575 m)     Head Circumference      Peak Flow      Pain Score      Pain Loc      Pain Edu?       Excl. in GC?     Constitutional: Alert and oriented. Well appearing and in no acute distress. Eyes: Conjunctivae are normal. PERRL. Nystagmus in all directions. Head: Atraumatic. Nose: No congestion/rhinnorhea. Mouth/Throat: Mucous membranes are moist.  Neck: No stridor.  Mild tenderness diffusely to the posterior C-spine without any deformity or step-off. Cardiovascular: Normal rate, regular rhythm. Grossly normal heart sounds.   Respiratory: Normal respiratory effort.  No retractions. Lungs CTAB. Gastrointestinal: Soft and nontender. No distention. No CVA tenderness. Musculoskeletal: No lower extremity tenderness nor edema.  No joint effusions. Neurologic:  Normal speech and language.  No gross focal neurologic deficits are appreciated. 5 out of 5 strength throughout without any sensory deficits to light touch. Patient also with tremor the bilateral lower extremities which is fine and present at rest. Skin:  Skin is warm, dry and intact. No rash noted. Psychiatric: Caregivers report patient with depressed mood ____________________________________________   LABS (all labs ordered are listed, but only abnormal results are displayed)  Labs Reviewed  CBC WITH DIFFERENTIAL/PLATELET - Abnormal; Notable for the following:       Result Value   WBC 13.9 (*)    Neutro Abs 9.4 (*)    All other components within normal limits  COMPREHENSIVE METABOLIC PANEL - Abnormal; Notable for the following:    Glucose, Bld 102 (*)    ALT 11 (*)    All other components within normal limits  URINALYSIS COMPLETEWITH MICROSCOPIC ([facility] ONLY) - Abnormal; Notable for the following:    Color, Urine YELLOW (*)    APPearance CLEAR (*)    Squamous Epithelial / LPF 0-5 (*)    All other components within normal limits  URINE DRUG SCREEN, QUALITATIVE ([facility] ONLY) - Abnormal; Notable for the following:    Benzodiazepine, Ur Scrn POSITIVE (*)    All other components within normal limits  VALPROIC ACID LEVEL  LITHIUM  LEVEL  TROPONIN I   ____________________________________________  EKG  ED ECG REPORT I, Arelia LongestSchaevitz,  Leray Garverick M, the attending physician, personally viewed and interpreted this ECG.   Date: 05/22/2016  EKG Time: 1834  Rate: 93  Rhythm: normal sinus rhythm  Axis: Normal axis  Intervals:none  ST&T Change: T wave inversions in V2 as well as V3. No ST elevation or depression. Giving change from EKG on 04/13/2016. ____________________________________________  RADIOLOGY  CT Angio Head W or Wo Contrast (Accession [id number]) (Order [id number])  Imaging  Date: 05/22/2016 Department: [facility] EMERGENCY DEPARTMENT Released By/Authorizing: Myrna Blazeravid Matthew Conor Filsaime, MD (auto-released)  Exam Information   Status Exam Begun  Exam Ended   Final [99] 05/22/2016 6:40 PM 05/22/2016 6:59 PM  PACS Images   Show images for CT Angio Head W or Wo Contrast  Study Result   CLINICAL DATA:  25 y/o F; right neck, fine, and foot pain. Status post fall last evening. Additional complaints of tremors to the right leg and numbness of right arm with nystagmus.  EXAM: CT ANGIOGRAPHY HEAD AND NECK  TECHNIQUE: Multidetector CT imaging of the head and neck was performed using the standard protocol during bolus administration of intravenous contrast. Multiplanar CT image reconstructions and MIPs were obtained to evaluate the vascular anatomy. Carotid stenosis measurements (when applicable) are obtained utilizing NASCET criteria, using the distal internal carotid diameter as the denominator.  CONTRAST:  75 cc Isovue 370  COMPARISON:  05/22/2016 CT of the head.  FINDINGS: CT HEAD FINDINGS  Brain: No evidence of acute infarction, hemorrhage, hydrocephalus, extra-axial collection or mass lesion/mass effect.  Vascular: See below.  Skull: Negative for fracture. Extensive hyperostosis frontalis interna.  Sinuses: Imaged portions are clear.  Orbits: No acute  finding.  Review of the MIP images confirms the above findings  CTA NECK FINDINGS  Aortic arch: Standard branching. Imaged portion shows no evidence of aneurysm or dissection. No significant stenosis of the major arch vessel origins.  Right carotid system: No evidence of dissection, stenosis (50% or greater) or occlusion.  Left carotid system: No evidence of dissection, stenosis (50% or greater) or occlusion.  Vertebral arteries: Codominant. No evidence of dissection, stenosis (50% or greater) or  occlusion.  Skeleton: Mild cervical spondylosis greatest at the C5-6 level with there are endplate degenerative changes a large anterior marginal osteophytes.  Other neck: Negative.  Upper chest: Clear  Review of the MIP images confirms the above findings  CTA HEAD FINDINGS  Anterior circulation: No significant stenosis, proximal occlusion, aneurysm, or vascular malformation.  Posterior circulation: No significant stenosis, proximal occlusion, aneurysm, or vascular malformation.  Venous sinuses: As permitted by contrast timing, patent.  Anatomic variants: Bilateral fetal PCA. The right vertebral artery largely terminates in the right PICC up with minimal contribution to basilar.  Delayed phase: No abnormal intracranial enhancement.  Review of the MIP images confirms the above findings  IMPRESSION: 1. No acute intracranial abnormality is identified. If symptoms persist or if clinically indicated MRI is more sensitive for acute stroke. 2. No evidence for dissection significant stenosis or occlusion of the carotid and vertebral arteries of the neck. 3. No significant stenosis, proximal occlusion, aneurysm, or vascular malformation of the circle of Willis.   Electronically Signed   By: Mitzi Hansen M.D.   On: 05/22/2016 19:17     ____________________________________________   PROCEDURES  Procedure(s) performed:    Procedures  Critical Care performed:   ____________________________________________   INITIAL IMPRESSION / ASSESSMENT AND PLAN / ED COURSE  Pertinent labs & imaging results that were available during my care of the patient were reviewed by me and considered in my medical decision making (see chart for details).  ----------------------------------------- 10:23 PM on 05/22/2016 -----------------------------------------  I had a discussion with the patient's caregivers were at the patient's bedside throughout the patient's emergency department stay. They're saying that the patient is altered and that she normally is more active and cheerful and that she usually does not have nystagmus like she is having right now. I obtained a neurology consult via the tele-neurology service by Dr. 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 He is also diagnosed her with encephalopathy and recommends the patient be admitted to the intensive care unit. He also suggested psychiatric consultation. Further consultation was obtained with Dr. Garnetta Buddy of the tele-psychiatry service who recommended the patient be admitted for continued medical management and suggested several medication changes.  Patient's caregivers are aware of the need for [facility]. Signed out to Dr. Anne Hahn. ----------------------------------------- 10:30 PM on 05/22/2016 -----------------------------------------  After Benadryl patient's nystagmus is improved but still present. Patient without any meningismus. Tenderness to the cervical spine is very mild and the patient is ranging her head and neck freely. No fever.  Clinical Course     ____________________________________________   FINAL CLINICAL IMPRESSION(S) / ED DIAGNOSES  Encephalopathy. Dystonic reaction.    NEW MEDICATIONS STARTED DURING THIS VISIT:  New Prescriptions   No medications on file     Note:  This  document was prepared using Dragon voice recognition software and may include unintentional dictation errors.    Myrna Blazer, MD 05/22/16 [phone]

## 2016-05-22 NOTE — ED Triage Notes (Addendum)
C/O right neck pain, right thigh, and right foot pain..  States was taking out trash last evening and fell to the ground. Called and spoke with Philomena DohenyVivian Tiimmons who states that patient has been complaining of tremors to right leg and numbness to right arm.  Sent to ED for evaluation.

## 2016-05-22 NOTE — ED Notes (Signed)
Spoke w/ telepsych MD Billey ChangFhaeen, she planned to adjust pts medications and recommended that pt be kept overnight for observation.

## 2016-05-23 DIAGNOSIS — H518 Other specified disorders of binocular movement: Secondary | ICD-10-CM

## 2016-05-23 DIAGNOSIS — F209 Schizophrenia, unspecified: Secondary | ICD-10-CM | POA: Diagnosis not present

## 2016-05-23 LAB — CBC
HEMATOCRIT: 36.4 % (ref 35.0–47.0)
HEMOGLOBIN: 12.5 g/dL (ref 12.0–16.0)
MCH: 31.7 pg (ref 26.0–34.0)
MCHC: 34.3 g/dL (ref 32.0–36.0)
MCV: 92.6 fL (ref 80.0–100.0)
Platelets: 203 10*3/uL (ref 150–440)
RBC: 3.93 MIL/uL (ref 3.80–5.20)
RDW: 12.9 % (ref 11.5–14.5)
WBC: 10.5 10*3/uL (ref 3.6–11.0)

## 2016-05-23 LAB — BASIC METABOLIC PANEL
ANION GAP: 6 (ref 5–15)
BUN: 14 mg/dL (ref 6–20)
CALCIUM: 9 mg/dL (ref 8.9–10.3)
CHLORIDE: 110 mmol/L (ref 101–111)
CO2: 23 mmol/L (ref 22–32)
Creatinine, Ser: 1.1 mg/dL — ABNORMAL HIGH (ref 0.44–1.00)
GFR calc non Af Amer: >60 mL/min (ref >60)
Glucose, Bld: 176 mg/dL — ABNORMAL HIGH (ref 65–99)
POTASSIUM: 3 mmol/L — AB (ref 3.5–5.1)
Sodium: 139 mmol/L (ref 135–145)

## 2016-05-23 LAB — MRSA PCR SCREENING: MRSA by PCR: NEGATIVE

## 2016-05-23 LAB — VALPROIC ACID LEVEL: VALPROIC ACID LVL: 32 ug/mL — AB (ref 50.0–100.0)

## 2016-05-23 LAB — MAGNESIUM: Magnesium: 2.1 mg/dL (ref 1.7–2.4)

## 2016-05-23 MED ORDER — LORAZEPAM 0.5 MG PO TABS: 0.5000 mg | ORAL_TABLET | Freq: Three times a day (TID) | ORAL | Status: DC | PRN

## 2016-05-23 MED ORDER — DIVALPROEX SODIUM 125 MG PO CSDR
375.0000 mg | DELAYED_RELEASE_CAPSULE | Freq: Every day | ORAL | Status: DC
  Administered 2016-05-23 – 2016-05-24 (×2): 375 mg via ORAL
  Filled 2016-05-23 (×2): qty 3

## 2016-05-23 MED ORDER — POTASSIUM CHLORIDE CRYS ER 20 MEQ PO TBCR
40.0000 meq | EXTENDED_RELEASE_TABLET | Freq: Two times a day (BID) | ORAL | Status: AC
  Administered 2016-05-23 (×2): 40 meq via ORAL
  Filled 2016-05-23 (×2): qty 2

## 2016-05-23 MED ORDER — ONDANSETRON HCL 4 MG/2ML IJ SOLN: 4.0000 mg | Freq: Four times a day (QID) | INTRAMUSCULAR | Status: DC | PRN

## 2016-05-23 MED ORDER — DIPHENHYDRAMINE HCL 25 MG PO CAPS
50.0000 mg | ORAL_CAPSULE | Freq: Every day | ORAL | Status: DC
  Administered 2016-05-23 – 2016-05-24 (×2): 50 mg via ORAL
  Filled 2016-05-23 (×2): qty 2

## 2016-05-23 MED ORDER — ACETAMINOPHEN 325 MG PO TABS
650.0000 mg | ORAL_TABLET | Freq: Four times a day (QID) | ORAL | Status: DC | PRN
Start: 2016-05-23 — End: 2016-05-24

## 2016-05-23 MED ORDER — DOCUSATE SODIUM 100 MG PO CAPS
100.0000 mg | ORAL_CAPSULE | Freq: Two times a day (BID) | ORAL | Status: DC
  Administered 2016-05-23: 21:00:00 100 mg via ORAL
  Filled 2016-05-23: qty 1

## 2016-05-23 MED ORDER — ARIPIPRAZOLE 15 MG PO TABS
15.0000 mg | ORAL_TABLET | Freq: Every day | ORAL | Status: DC
  Administered 2016-05-23 – 2016-05-24 (×2): 15 mg via ORAL
  Filled 2016-05-23 (×2): qty 1

## 2016-05-23 MED ORDER — PAROXETINE HCL 20 MG PO TABS
20.0000 mg | ORAL_TABLET | Freq: Every day | ORAL | Status: DC
  Administered 2016-05-23 – 2016-05-24 (×2): 20 mg via ORAL
  Filled 2016-05-23 (×2): qty 1

## 2016-05-23 MED ORDER — INFLUENZA VAC SPLIT QUAD 0.5 ML IM SUSY
0.5000 mL | PREFILLED_SYRINGE | INTRAMUSCULAR | Status: AC
  Administered 2016-05-24: 0.5 mL via INTRAMUSCULAR
  Filled 2016-05-23: qty 0.5

## 2016-05-23 MED ORDER — CLONIDINE HCL 0.3 MG/24HR TD PTWK
0.3000 mg | MEDICATED_PATCH | TRANSDERMAL | Status: DC
  Administered 2016-05-23: 0.3 mg via TRANSDERMAL
  Filled 2016-05-23: qty 1

## 2016-05-23 MED ORDER — DIVALPROEX SODIUM 125 MG PO CPSP: 375.0000 mg | ORAL_CAPSULE | Freq: Two times a day (BID) | ORAL | Status: DC

## 2016-05-23 MED ORDER — BENZTROPINE MESYLATE 1 MG PO TABS
1.0000 mg | ORAL_TABLET | Freq: Two times a day (BID) | ORAL | Status: DC
  Administered 2016-05-23 – 2016-05-24 (×4): 1 mg via ORAL
  Filled 2016-05-23 (×4): qty 1

## 2016-05-23 MED ORDER — QUETIAPINE FUMARATE 25 MG PO TABS: 50.0000 mg | ORAL_TABLET | Freq: Four times a day (QID) | ORAL | Status: DC | PRN

## 2016-05-23 MED ORDER — BUPROPION HCL ER (XL) 150 MG PO TB24
150.0000 mg | ORAL_TABLET | Freq: Every day | ORAL | Status: DC
  Administered 2016-05-23 – 2016-05-24 (×2): 150 mg via ORAL
  Filled 2016-05-23 (×3): qty 1

## 2016-05-23 MED ORDER — ACETAMINOPHEN 650 MG RE SUPP: 650.0000 mg | Freq: Four times a day (QID) | RECTAL | Status: DC | PRN

## 2016-05-23 MED ORDER — PANTOPRAZOLE SODIUM 40 MG PO TBEC
40.0000 mg | DELAYED_RELEASE_TABLET | Freq: Every day | ORAL | Status: DC
  Administered 2016-05-23: 40 mg via ORAL
  Filled 2016-05-23: qty 1

## 2016-05-23 MED ORDER — ENOXAPARIN SODIUM 40 MG/0.4ML ~~LOC~~ SOLN
40.0000 mg | Freq: Every day | SUBCUTANEOUS | Status: DC
  Administered 2016-05-23 (×2): 40 mg via SUBCUTANEOUS
  Filled 2016-05-23 (×2): qty 0.4

## 2016-05-23 MED ORDER — ONDANSETRON HCL 4 MG PO TABS: 4.0000 mg | ORAL_TABLET | Freq: Four times a day (QID) | ORAL | Status: DC | PRN

## 2016-05-23 MED ORDER — LITHIUM CARBONATE ER 300 MG PO TBCR
600.0000 mg | EXTENDED_RELEASE_TABLET | Freq: Every day | ORAL | Status: DC
  Administered 2016-05-23: 21:00:00 600 mg via ORAL
  Filled 2016-05-23: qty 2

## 2016-05-23 MED ORDER — DIVALPROEX SODIUM 125 MG PO CSDR
750.0000 mg | DELAYED_RELEASE_CAPSULE | Freq: Every day | ORAL | Status: DC
  Administered 2016-05-23 (×2): 750 mg via ORAL
  Filled 2016-05-23 (×2): qty 6

## 2016-05-23 MED ORDER — LORAZEPAM 1 MG PO TABS
1.0000 mg | ORAL_TABLET | Freq: Two times a day (BID) | ORAL | Status: DC
  Administered 2016-05-23 – 2016-05-24 (×4): 1 mg via ORAL
  Filled 2016-05-23 (×4): qty 1

## 2016-05-23 MED ORDER — VALACYCLOVIR HCL 500 MG PO TABS: 2000.0000 mg | ORAL_TABLET | Freq: Two times a day (BID) | ORAL | Status: DC | PRN

## 2016-05-23 NOTE — Progress Notes (Signed)
Below is most recent admission on 05/21/16. Call placed to [facility] [phone]/ fax # [phone] DSS [facility]. Discussed plan of care with Minerva Areola. He is aware of admission and updated on neuro consult and MD progress note.  Plan: Return to [facility] once medically stable.  Deretha Emory, MSW Clinical Social Work: System Wide Float Coverage for :  [phone]       Clinical Social Work Assessment  Patient Details  Name: Hannah Keller MRN: [id number] Date of Birth: [birth year]  Date of referral:                   Reason for consult:  Emotional/Coping/Adjustment to Illness                   Permission sought to share information with:    Permission granted to share information::  Yes, Verbal Permission Granted             Name::     Micah Noel 528 [phone]/ fax # [phone] DSS [facility]             Agency::                Relationship::                Contact Information:  [name] [phone]  Housing/Transportation Living arrangements for the past 2 months:  Group Home ([facility] ) Source of Information:  Guardian, Facility Patient Interpreter Needed:  None Criminal Activity/Legal Involvement Pertinent to Current Situation/Hospitalization:  No - Comment as needed Significant Relationships:  None, [employer] Lives with:  Facility Resident Do you feel safe going back to the place where you live?    Need for family participation in patient care:  No (Coment)  Care giving concerns:  Caregiver Ms Burnadette Pop concerned with patients recent aggressive behavior towards other residents   Social Worker assessment / plan: LCSW called Patients guardian  Alberteen Spindle  [phone] Group home Provider [name] [phone]. Patient has resided at [facility] for 7 and half years. 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 She is  independent but needs constant supervision and prompting. Patient is able to verba lise needs  and has good hearing and speech and eyesight. Patient in addition has a Gaffer at [employer]. Patient once cleared is able to return to group home ( They are unable to transport her to group home until 7pm)SOC pending.  Employment status:  Disabled (Comment on whether or not currently receiving Disability) Insurance information:  Medicaid In [place] PT Recommendations:  Not assessed at this time Information / Referral to community resources:   (None at this time very connected)  Patient/Family's Response to care: Needs extra 1-1 supervision due to agression  Patient/Family's Understanding of and Emotional Response to Diagnosis, Current Treatment, and Prognosis:  No family contact- Guardian is aware patient likely to be discharged today.  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

## 2016-05-23 NOTE — Progress Notes (Signed)
Chaplain was rounds and visited pt in room 118. Provided the ministry of prayer and a pastoral presence.    05/23/16 0950  Clinical Encounter Type  Visited With Patient  Visit Type Initial;Spiritual support  Referral From Nurse  Spiritual Encounters  Spiritual Needs Prayer;Emotional

## 2016-05-23 NOTE — Care Management (Signed)
Admitted to [facility] with the diagnosis of oculogyric crisis under observation status.  A [place] [facility] [place]. Owner is Juleen ChinaVivian Timmons [phone]). Sees Franco Nonesheryl Lindley FNP as primary care person.  Gwenette GreetBrenda S Swannie Milius RN MSN CCM Care Management [phone]

## 2016-05-23 NOTE — Progress Notes (Signed)
White patch with no label on patient's left shoulder. [facility] new clonidine patch since the one currently has no label, date, or initials. Will removed unlabeled white patch and replace with new clonidine patch.

## 2016-05-23 NOTE — Consult Note (Signed)
Reason for Consult:Oculogyric Crisis Referring Physician: Imogene Burnhen  CC: Oculogyric crisis  HPI: Hannah Keller is an 25 y.o. female with a history of MR and schizophrenia who is unable to provide history and caregivers not available.  All history obtained from the chart.  The patient was getting up from the table after eating on yesterday when she had flaccidity and pain to her left arm and leg. She was falling to the ground but was caught by staff. She did not have any impact to the ground. However on presentation she was complaining of intermittent right arm numbness as well as neck pain. The caregivers were also concerned because of "twitching" to her eyes which which is common for the patient becomes agitated. Unclear to what extent.  [facility] and felt to be in oculogyric crisis.  Was given Benadryl with some improvement in her nystagmus.  Was admitted at that time.    Past Medical History:  Diagnosis Date  . Developmental delay   . Mental retardation    Mild  . Paranoid schizophrenia (HCC)   . Seizures (HCC)     Past Surgical History:  Procedure Laterality Date  . ESOPHAGOGASTRODUODENOSCOPY (EGD) WITH PROPOFOL N/A 05/18/2015   Procedure: ESOPHAGOGASTRODUODENOSCOPY (EGD) WITH PROPOFOL;  Surgeon: Christena DeemMartin U Skulskie, MD;  [facility];  Service: Endoscopy;  Laterality: N/A;    Family History  Problem Relation Age of Onset  . Family history unknown: Yes    Social History:  reports that she has never smoked. She has never used smokeless tobacco. She reports that she does not drink alcohol or use drugs.  Allergies  Allergen Reactions  . Ritalin [Methylphenidate Hcl] Other (See Comments)    Reaction:  Unknown     Medications:  I have reviewed the patient's current medications. Prior to Admission:  Prescriptions Prior to Admission  Medication Sig Dispense Refill Last Dose  . acetaminophen (TYLENOL) 325 MG tablet Take 325 mg by mouth every 6 (six) hours as  needed for mild pain, fever or headache.   prn at prn  . ARIPiprazole (ABILIFY) 15 MG tablet Take 15 mg by mouth daily at 12 noon.   unknown at unknown  . Asenapine Maleate (SAPHRIS) 10 MG SUBL Place 1 tablet under the tongue 3 (three) times daily.    unknown at unknown  . benztropine (COGENTIN) 0.5 MG tablet Take 0.5 mg by mouth 2 (two) times daily.    unknown at unknown  . buPROPion (WELLBUTRIN XL) 150 MG 24 hr tablet Take 150 mg by mouth daily.   unknown at unknown  . cholecalciferol (VITAMIN D) 1000 UNITS tablet Take 1,000 Units by mouth 2 (two) times daily.   unknown at unknown  . cloNIDine (CATAPRES - DOSED IN MG/24 HR) 0.3 mg/24hr patch Place 0.3 mg onto the skin once a week. Pt applies on Friday.   unknown at unknown  . divalproex (DEPAKOTE SPRINKLE) 125 MG capsule Take 375-750 mg by mouth 2 (two) times daily. 375mg  in the morning and 750mg  at night   unknown at unknown  . docusate sodium (COLACE) 100 MG capsule Take 200 mg by mouth daily.    unknown at unknown  . ferrous sulfate 325 (65 FE) MG tablet Take 325 mg by mouth daily.   unknown at unknown  . hydrochlorothiazide (HYDRODIURIL) 25 MG tablet Take 25 mg by mouth daily.   unknown at unknown  . lithium carbonate 300 MG capsule Take 1 capsule (300 mg total) by mouth at bedtime. (  Patient taking differently: Take 600 mg by mouth at bedtime. ) 20 capsule 0 unknown at unknown  . LORazepam (ATIVAN) 0.5 MG tablet Take 0.5 mg by mouth every 8 (eight) hours as needed for anxiety.   prn at prn  . LORazepam (ATIVAN) 1 MG tablet Take 1 mg by mouth 2 (two) times daily.   unknown at unknown  . meclizine (ANTIVERT) 25 MG tablet Take 25 mg by mouth 3 (three) times daily as needed for dizziness.   Not Taking at Unknown time  . medroxyPROGESTERone (DEPO-PROVERA) 150 MG/ML injection Inject 150 mg into the muscle every 3 (three) months.   unknown at unknown  . naltrexone (DEPADE) 50 MG tablet Take 50 mg by mouth daily.   unknown at unknown  . Olopatadine  HCl (PATADAY) 0.2 % SOLN Apply 1 drop to eye daily as needed (for allergies).    prn at prn  . omeprazole (PRILOSEC) 20 MG capsule Take 20 mg by mouth daily.   unknown at unknown  . PARoxetine (PAXIL) 20 MG tablet Take 20 mg by mouth daily.   unknown at unknown  . polyethylene glycol (MIRALAX / GLYCOLAX) packet Take 17 g by mouth every other day.   unknown at unknown  . QUEtiapine (SEROQUEL) 50 MG tablet Take 50 mg by mouth 3 (three) times daily.   unknown at unknown  . solifenacin (VESICARE) 10 MG tablet Take 10 mg by mouth daily.   unknown at unknown  . valACYclovir (VALTREX) 1000 MG tablet Take 2,000 mg by mouth 2 (two) times daily as needed (for fever blisters).   prn at prn   Scheduled: . benztropine  1 mg Oral BID  . buPROPion  150 mg Oral Daily  . diphenhydrAMINE  50 mg Oral Daily  . divalproex  375 mg Oral Daily   And  . divalproex  750 mg Oral Q2200  . enoxaparin (LOVENOX) injection  40 mg Subcutaneous QHS  . [START ON 05/24/2016] Influenza vac split quadrivalent PF  0.5 mL Intramuscular Tomorrow-1000  . LORazepam  1 mg Oral BID  . pantoprazole  40 mg Oral QAC breakfast  . PARoxetine  20 mg Oral Daily  . potassium chloride  40 mEq Oral BID    ROS: History obtained from chart review  General ROS: negative for - chills, fatigue, fever, night sweats, weight gain or weight loss Psychological ROS: negative for - behavioral disorder, hallucinations, memory difficulties, mood swings or suicidal ideation Ophthalmic ROS: negative for - blurry vision, double vision, eye pain or loss of vision ENT ROS: negative for - epistaxis, nasal discharge, oral lesions, sore throat, tinnitus or vertigo Allergy and Immunology ROS: negative for - hives or itchy/watery eyes Hematological and Lymphatic ROS: negative for - bleeding problems, bruising or swollen lymph nodes Endocrine ROS: negative for - galactorrhea, hair pattern changes, polydipsia/polyuria or temperature intolerance Respiratory ROS:  negative for - cough, hemoptysis, shortness of breath or wheezing Cardiovascular ROS: negative for - chest pain, dyspnea on exertion, edema or irregular heartbeat Gastrointestinal ROS: negative for - abdominal pain, diarrhea, hematemesis, nausea/vomiting or stool incontinence Genito-Urinary ROS: negative for - dysuria, hematuria, incontinence or urinary frequency/urgency Musculoskeletal ROS: neck pain Neurological ROS: as noted in HPI Dermatological ROS: negative for rash and skin lesion changes  Physical Examination: Blood pressure 124/79, pulse 91, temperature 98.1 F (36.7 C), temperature source Oral, resp. rate 18, height 5\' 2"  (1.575 m), weight 72.6 kg (160 lb), SpO2 100 %.  HEENT-  Normocephalic, no lesions, without obvious abnormality.  Normal external eye and conjunctiva.  Normal TM's bilaterally.  Normal auditory canals and external ears. Normal external nose, mucus membranes and septum.  Normal pharynx. Cardiovascular- S1, S2 normal, pulses palpable throughout   Lungs- chest clear, no wheezing, rales, normal symmetric air entry Abdomen- soft, non-tender; bowel sounds normal; no masses,  no organomegaly Extremities- no edema Lymph-no adenopathy palpable Musculoskeletal-no joint tenderness, deformity or swelling Skin-warm and dry, no hyperpigmentation, vitiligo, or suspicious lesions  Neurological Examination Mental Status: Alert and awake.  Speech fluent without evidence of aphasia.  Able to follow 3 step commands without difficulty. Cranial Nerves: II: Discs flat bilaterally; Visual fields grossly normal, pupils equal, round, reactive to light and accommodation III,IV, VI: ptosis not present, continuous nystagmus V,VII: smile symmetric, facial light touch sensation normal bilaterally VIII: hearing normal bilaterally IX,X: gag reflex present XI: bilateral shoulder shrug XII: midline tongue extension Motor: 5/5 strength in the upper extremities.  Lifts both legs off the bed  only 2-3 inches but no focal weakness noted.  Intact plantar flexion and extension.   Sensory: Pinprick and light touch intact throughout, bilaterally Deep Tendon Reflexes: 3+ and symmetric throughout Plantars: Right: upgoing   Left: upgoing Cerebellar: Normal finger-to-nose testing bilaterally Gait: not tested due to safety concerns.      Laboratory Studies:   Basic Metabolic Panel:  Recent Labs Lab 05/18/16 1608 05/20/16 1220 05/22/16 1824 05/23/16 0133  NA 140 137 140 139  K 3.3* 3.6 3.8 3.0*  CL 109 107 106 110  CO2 24 23 27 23   GLUCOSE 124* 123* 102* 176*  BUN 18 16 15 14   CREATININE 1.34* 1.17* 0.97 1.10*  CALCIUM 9.5 9.2 9.5 9.0  MG  --   --   --  2.1    Liver Function Tests:  Recent Labs Lab 05/18/16 1608 05/20/16 1220 05/22/16 1824  AST 25 24 24   ALT 11* 11* 11*  ALKPHOS 50 46 51  BILITOT 0.2* 0.3 0.7  PROT 7.3 7.3 7.7  ALBUMIN 3.7 3.8 3.9   No results for input(s): LIPASE, AMYLASE in the last 168 hours. No results for input(s): AMMONIA in the last 168 hours.  CBC:  Recent Labs Lab 05/18/16 1608 05/20/16 1220 05/22/16 1824 05/23/16 0133  WBC 9.5 10.4 13.9* 10.5  NEUTROABS  --   --  9.4*  --   HGB 12.4 12.6 12.8 12.5  HCT 36.5 38.3 38.0 36.4  MCV 92.3 93.6 92.7 92.6  PLT 211 222 227 203    Cardiac Enzymes:  Recent Labs Lab 05/22/16 1824  TROPONINI <0.03    BNP: Invalid input(s): POCBNP  CBG: No results for input(s): GLUCAP in the last 168 hours.  Microbiology: Results for orders placed or performed during the hospital encounter of 05/22/16  MRSA PCR Screening     Status: None   Collection Time: 05/23/16  1:59 AM  Result Value Ref Range Status   MRSA by PCR NEGATIVE NEGATIVE Final    Comment:        The GeneXpert MRSA Assay (FDA approved for NASAL specimens only), is one component of a comprehensive MRSA colonization surveillance program. It is not intended to diagnose MRSA infection nor to guide or monitor treatment  for MRSA infections.     Coagulation Studies: No results for input(s): LABPROT, INR in the last 72 hours.  Urinalysis:  Recent Labs Lab 05/22/16 1824  COLORURINE YELLOW*  LABSPEC 1.017  PHURINE 7.0  GLUCOSEU NEGATIVE  HGBUR NEGATIVE  BILIRUBINUR NEGATIVE  KETONESUR NEGATIVE  PROTEINUR NEGATIVE  NITRITE NEGATIVE  LEUKOCYTESUR NEGATIVE    Lipid Panel:  No results found for: CHOL, TRIG, HDL, CHOLHDL, VLDL, LDLCALC  HgbA1C: No results found for: HGBA1C  Urine Drug Screen:     Component Value Date/Time   LABOPIA NONE DETECTED 05/22/2016 1824   COCAINSCRNUR NONE DETECTED 05/22/2016 1824   LABBENZ POSITIVE (A) 05/22/2016 1824   AMPHETMU NONE DETECTED 05/22/2016 1824   THCU NONE DETECTED 05/22/2016 1824   LABBARB NONE DETECTED 05/22/2016 1824    Alcohol Level:  Recent Labs Lab 05/18/16 1608 05/20/16 1220  ETH <5 <5    Other results: EKG: sinus rhythm at 93 bpm.  Imaging: Ct Angio Head W Or Wo Contrast  Result Date: 05/22/2016 CLINICAL DATA:  25 y/o F; right neck, fine, and foot pain. Status post fall last evening. Additional complaints of tremors to the right leg and numbness of right arm with nystagmus. EXAM: CT ANGIOGRAPHY HEAD AND NECK TECHNIQUE: Multidetector CT imaging of the head and neck was performed using the standard protocol during bolus administration of intravenous contrast. Multiplanar CT image reconstructions and MIPs were obtained to evaluate the vascular anatomy. Carotid stenosis measurements (when applicable) are obtained utilizing NASCET criteria, using the distal internal carotid diameter as the denominator. CONTRAST:  75 cc Isovue 370 COMPARISON:  05/22/2016 CT of the head. FINDINGS: CT HEAD FINDINGS Brain: No evidence of acute infarction, hemorrhage, hydrocephalus, extra-axial collection or mass lesion/mass effect. Vascular: See below. Skull: Negative for fracture. Extensive hyperostosis frontalis interna. Sinuses: Imaged portions are clear. Orbits:  No acute finding. Review of the MIP images confirms the above findings CTA NECK FINDINGS Aortic arch: Standard branching. Imaged portion shows no evidence of aneurysm or dissection. No significant stenosis of the major arch vessel origins. Right carotid system: No evidence of dissection, stenosis (50% or greater) or occlusion. Left carotid system: No evidence of dissection, stenosis (50% or greater) or occlusion. Vertebral arteries: Codominant. No evidence of dissection, stenosis (50% or greater) or occlusion. Skeleton: Mild cervical spondylosis greatest at the C5-6 level with there are endplate degenerative changes a large anterior marginal osteophytes. Other neck: Negative. Upper chest: Clear Review of the MIP images confirms the above findings CTA HEAD FINDINGS Anterior circulation: No significant stenosis, proximal occlusion, aneurysm, or vascular malformation. Posterior circulation: No significant stenosis, proximal occlusion, aneurysm, or vascular malformation. Venous sinuses: As permitted by contrast timing, patent. Anatomic variants: Bilateral fetal PCA. The right vertebral artery largely terminates in the right PICC up with minimal contribution to basilar. Delayed phase: No abnormal intracranial enhancement. Review of the MIP images confirms the above findings IMPRESSION: 1. No acute intracranial abnormality is identified. If symptoms persist or if clinically indicated MRI is more sensitive for acute stroke. 2. No evidence for dissection significant stenosis or occlusion of the carotid and vertebral arteries of the neck. 3. No significant stenosis, proximal occlusion, aneurysm, or vascular malformation of the circle of Willis. Electronically Signed   By: Mitzi Hansen M.D.   On: 05/22/2016 19:17   Ct Angio Neck W And/or Wo Contrast  Result Date: 05/22/2016 CLINICAL DATA:  25 y/o F; right neck, fine, and foot pain. Status post fall last evening. Additional complaints of tremors to the right  leg and numbness of right arm with nystagmus. EXAM: CT ANGIOGRAPHY HEAD AND NECK TECHNIQUE: Multidetector CT imaging of the head and neck was performed using the standard protocol during bolus administration of intravenous contrast. Multiplanar CT image reconstructions and MIPs were obtained to evaluate the vascular anatomy.  Carotid stenosis measurements (when applicable) are obtained utilizing NASCET criteria, using the distal internal carotid diameter as the denominator. CONTRAST:  75 cc Isovue 370 COMPARISON:  05/22/2016 CT of the head. FINDINGS: CT HEAD FINDINGS Brain: No evidence of acute infarction, hemorrhage, hydrocephalus, extra-axial collection or mass lesion/mass effect. Vascular: See below. Skull: Negative for fracture. Extensive hyperostosis frontalis interna. Sinuses: Imaged portions are clear. Orbits: No acute finding. Review of the MIP images confirms the above findings CTA NECK FINDINGS Aortic arch: Standard branching. Imaged portion shows no evidence of aneurysm or dissection. No significant stenosis of the major arch vessel origins. Right carotid system: No evidence of dissection, stenosis (50% or greater) or occlusion. Left carotid system: No evidence of dissection, stenosis (50% or greater) or occlusion. Vertebral arteries: Codominant. No evidence of dissection, stenosis (50% or greater) or occlusion. Skeleton: Mild cervical spondylosis greatest at the C5-6 level with there are endplate degenerative changes a large anterior marginal osteophytes. Other neck: Negative. Upper chest: Clear Review of the MIP images confirms the above findings CTA HEAD FINDINGS Anterior circulation: No significant stenosis, proximal occlusion, aneurysm, or vascular malformation. Posterior circulation: No significant stenosis, proximal occlusion, aneurysm, or vascular malformation. Venous sinuses: As permitted by contrast timing, patent. Anatomic variants: Bilateral fetal PCA. The right vertebral artery largely  terminates in the right PICC up with minimal contribution to basilar. Delayed phase: No abnormal intracranial enhancement. Review of the MIP images confirms the above findings IMPRESSION: 1. No acute intracranial abnormality is identified. If symptoms persist or if clinically indicated MRI is more sensitive for acute stroke. 2. No evidence for dissection significant stenosis or occlusion of the carotid and vertebral arteries of the neck. 3. No significant stenosis, proximal occlusion, aneurysm, or vascular malformation of the circle of Willis. Electronically Signed   By: Mitzi Hansen M.D.   On: 05/22/2016 19:17     Assessment/Plan: 25 year old female presenting with what appears to be oculogyric crisis.  CT and CTA of the head and neck reviewed and show no acute changes.  From review of the patient's records It appears that lithium has been recently added.  Patient on other antipsychotics as well.  On Depakote with a level of 57.  On Cogentin at baseline.   Psychiatry evaluated the patient in the ED.  Lithium and Abilify have been discontinued.  Cogentin increased.   Recommendations: 1.  Benadryl daily at 50mg .  Agree with increase in Cogentin. 2.  Agree with psychiatry involvement  Thana Farr, MD Neurology [phone] 05/23/2016, 12:10 PM

## 2016-05-23 NOTE — Progress Notes (Signed)
[facility]   PATIENT NAME: Hannah Keller    MR#:  [id number]  DATE OF BIRTH:  Mar 13, 1991  SUBJECTIVE:  CHIEF COMPLAINT:   Chief Complaint  Patient presents with  . Neck Pain  . Leg Pain   No complaint, she wants to go home. REVIEW OF SYSTEMS:  Review of Systems  Unable to perform ROS: Mental acuity    DRUG ALLERGIES:   Allergies  Allergen Reactions  . Ritalin [Methylphenidate Hcl] Other (See Comments)    Reaction:  Unknown    VITALS:  Blood pressure 124/79, pulse 91, temperature 98.1 F (36.7 C), temperature source Oral, resp. rate 18, height 5\' 2"  (1.575 m), weight 160 lb (72.6 kg), SpO2 100 %. PHYSICAL EXAMINATION:  Physical Exam  Constitutional: She is well-developed, well-nourished, and in no distress.  HENT:  Head: Normocephalic.  Mouth/Throat: Oropharynx is clear and moist.  Eyes: Conjunctivae and EOM are normal. No scleral icterus.  Neck: Normal range of motion. Neck supple. No JVD present. No tracheal deviation present.  Cardiovascular: Normal rate and normal heart sounds.  Exam reveals no gallop.   No murmur heard. Pulmonary/Chest: Effort normal and breath sounds normal. No respiratory distress. She has no wheezes. She has no rales.  Abdominal: Soft. Bowel sounds are normal. She exhibits no distension. There is no tenderness.  Musculoskeletal: She exhibits no edema or tenderness.  Neurological: She is alert. No cranial nerve deficit.  continuous nystagmus  Skin: Skin is warm.   LABORATORY PANEL:   CBC  Recent Labs Lab 05/23/16 0133  WBC 10.5  HGB 12.5  HCT 36.4  PLT 203   ------------------------------------------------------------------------------------------------------------------ Chemistries   Recent Labs Lab 05/22/16 1824 05/23/16 0133  NA 140 139  K 3.8 3.0*  CL 106 110  CO2 27 23  GLUCOSE 102* 176*  BUN 15 14  CREATININE 0.97 1.10*  CALCIUM 9.5 9.0  MG  --  2.1  AST 24  --   ALT 11*   --   ALKPHOS 51  --   BILITOT 0.7  --    RADIOLOGY:  Ct Angio Head W Or Wo Contrast  Result Date: 05/22/2016 CLINICAL DATA:  25 y/o F; right neck, fine, and foot pain. Status post fall last evening. Additional complaints of tremors to the right leg and numbness of right arm with nystagmus. EXAM: CT ANGIOGRAPHY HEAD AND NECK TECHNIQUE: Multidetector CT imaging of the head and neck was performed using the standard protocol during bolus administration of intravenous contrast. Multiplanar CT image reconstructions and MIPs were obtained to evaluate the vascular anatomy. Carotid stenosis measurements (when applicable) are obtained utilizing NASCET criteria, using the distal internal carotid diameter as the denominator. CONTRAST:  75 cc Isovue 370 COMPARISON:  05/22/2016 CT of the head. FINDINGS: CT HEAD FINDINGS Brain: No evidence of acute infarction, hemorrhage, hydrocephalus, extra-axial collection or mass lesion/mass effect. Vascular: See below. Skull: Negative for fracture. Extensive hyperostosis frontalis interna. Sinuses: Imaged portions are clear. Orbits: No acute finding. Review of the MIP images confirms the above findings CTA NECK FINDINGS Aortic arch: Standard branching. Imaged portion shows no evidence of aneurysm or dissection. No significant stenosis of the major arch vessel origins. Right carotid system: No evidence of dissection, stenosis (50% or greater) or occlusion. Left carotid system: No evidence of dissection, stenosis (50% or greater) or occlusion. Vertebral arteries: Codominant. No evidence of dissection, stenosis (50% or greater) or occlusion. Skeleton: Mild cervical spondylosis greatest at the C5-6 level with there  are endplate degenerative changes a large anterior marginal osteophytes. Other neck: Negative. Upper chest: Clear Review of the MIP images confirms the above findings CTA HEAD FINDINGS Anterior circulation: No significant stenosis, proximal occlusion, aneurysm, or vascular  malformation. Posterior circulation: No significant stenosis, proximal occlusion, aneurysm, or vascular malformation. Venous sinuses: As permitted by contrast timing, patent. Anatomic variants: Bilateral fetal PCA. The right vertebral artery largely terminates in the right PICC up with minimal contribution to basilar. Delayed phase: No abnormal intracranial enhancement. Review of the MIP images confirms the above findings IMPRESSION: 1. No acute intracranial abnormality is identified. If symptoms persist or if clinically indicated MRI is more sensitive for acute stroke. 2. No evidence for dissection significant stenosis or occlusion of the carotid and vertebral arteries of the neck. 3. No significant stenosis, proximal occlusion, aneurysm, or vascular malformation of the circle of Willis. Electronically Signed   By: Mitzi Hansen M.D.   On: 05/22/2016 19:17   Ct Angio Neck W And/or Wo Contrast  Result Date: 05/22/2016 CLINICAL DATA:  25 y/o F; right neck, fine, and foot pain. Status post fall last evening. Additional complaints of tremors to the right leg and numbness of right arm with nystagmus. EXAM: CT ANGIOGRAPHY HEAD AND NECK TECHNIQUE: Multidetector CT imaging of the head and neck was performed using the standard protocol during bolus administration of intravenous contrast. Multiplanar CT image reconstructions and MIPs were obtained to evaluate the vascular anatomy. Carotid stenosis measurements (when applicable) are obtained utilizing NASCET criteria, using the distal internal carotid diameter as the denominator. CONTRAST:  75 cc Isovue 370 COMPARISON:  05/22/2016 CT of the head. FINDINGS: CT HEAD FINDINGS Brain: No evidence of acute infarction, hemorrhage, hydrocephalus, extra-axial collection or mass lesion/mass effect. Vascular: See below. Skull: Negative for fracture. Extensive hyperostosis frontalis interna. Sinuses: Imaged portions are clear. Orbits: No acute finding. Review of the MIP  images confirms the above findings CTA NECK FINDINGS Aortic arch: Standard branching. Imaged portion shows no evidence of aneurysm or dissection. No significant stenosis of the major arch vessel origins. Right carotid system: No evidence of dissection, stenosis (50% or greater) or occlusion. Left carotid system: No evidence of dissection, stenosis (50% or greater) or occlusion. Vertebral arteries: Codominant. No evidence of dissection, stenosis (50% or greater) or occlusion. Skeleton: Mild cervical spondylosis greatest at the C5-6 level with there are endplate degenerative changes a large anterior marginal osteophytes. Other neck: Negative. Upper chest: Clear Review of the MIP images confirms the above findings CTA HEAD FINDINGS Anterior circulation: No significant stenosis, proximal occlusion, aneurysm, or vascular malformation. Posterior circulation: No significant stenosis, proximal occlusion, aneurysm, or vascular malformation. Venous sinuses: As permitted by contrast timing, patent. Anatomic variants: Bilateral fetal PCA. The right vertebral artery largely terminates in the right PICC up with minimal contribution to basilar. Delayed phase: No abnormal intracranial enhancement. Review of the MIP images confirms the above findings IMPRESSION: 1. No acute intracranial abnormality is identified. If symptoms persist or if clinically indicated MRI is more sensitive for acute stroke. 2. No evidence for dissection significant stenosis or occlusion of the carotid and vertebral arteries of the neck. 3. No significant stenosis, proximal occlusion, aneurysm, or vascular malformation of the circle of Willis. Electronically Signed   By: Mitzi Hansen M.D.   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  Agree with increase in Cogentin per Dr. Jodi Mourning, Neurologist.  F/u psychiatry consults     Paranoid schizophrenia (HCC) - continue some home meds, f/u psychiatry recommendations before restarting any antipsychotics    Seizures (HCC) - continue home meds, valproic acid level 57, 32. Hypokalemia. Given potassium supplement and follow-up BMP.  I discussed with Dr. Jodi Mourning. All the records are reviewed and case discussed with Care Management/Social Worker. Management plans discussed with the patient, family and they are in agreement.  CODE STATUS: full code.  TOTAL TIME TAKING CARE OF THIS PATIENT: 32 minutes.   More than 50% of the time was spent in counseling/coordination of care: YES  POSSIBLE D/C IN 1 DAYS, DEPENDING ON CLINICAL CONDITION.   Shaune Pollack M.D on 05/23/2016 at 12:58 PM  Between 7am to 6pm - Pager - [phone]  After 6pm go to [email or web address] - Social research officer, government  [facility]  Office  [phone]  CC: Primary care physician; Domenic Schwab, FNP  Note: This dictation was prepared with Dragon dictation along with smaller phrase technology. Any transcriptional errors that result from this process are unintentional.

## 2016-05-23 NOTE — Consult Note (Signed)
[facility] Consult   Reason for Consult:  This is a consult for this 25 year old woman with a history of chronic developmental disability currently in the emergency room after a fall at home. Question raised about oculogyric crisis. Referring Physician:  Bridgett Larsson Patient Identification: Margery Szostak MRN:  [id number] Principal Diagnosis: Schizophrenia Diagnosis:   Patient Active Problem List   Diagnosis Date Noted  . Seizures ([place]) [R56.9] 05/22/2016  . 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 Paranoid schizophrenia ([place]) [F20.0] 04/12/2016  . Acute renal failure syndrome ([place]) [N17.9]   . 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 Sinus pause [I45.5]   . Lithium toxicity [T56.891A] 03/29/2015  . 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 female patient admitted with "I'm writing a letter to my boyfriend". Also "my leg hurts".  HPI:  Patient interviewed. Chart reviewed. Patient known to me from several prior encounters in the emergency room as well. I had just seen her in the emergency room in fact last week he cause of an episode of supposedly aggression at her group home. She was brought back to the emergency room after what sounds like a fall with a possible injury to her leg. Patient on interview with me today was not able to coherently describe exactly what happened except that she had fallen down and hurt her leg. Psychiatrically she said that her mood was okay. Denied any suicidal or homicidal ideation. Didn't make any grossly bizarre statements. As usual her level of cognition is pretty simple and her history is not very reliable.  Social history: Patient has a guardian. Lives at a group home. We've only been seeing her for a short period of time. Don't know very much about her longer term history. Patient occasionally talks about her "sister" or "mother" but what she is apparently  referring to are actually people at the group home. She does not actually live with her family.  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 She is very short. Limbs and head appear somewhat malformed. Eyes are per Erroll Luna and she has a chronic nystagmus like twitch to her eye movements which I have observed every time I have seen her. He has a history of AV block. She was admitted to the hospital on at least one occasion because of lithium toxicity but on subsequent visits has not had elevated lithium  Substance abuse history: None as far as I can tell  Past Psychiatric History: Patient carries a diagnosis of schizophrenia or schizoaffective disorder but clearly has chronic intellectual developmental delay. I find it often very hard to distinguish in people with this degree of cognitive impairment whether or not they really have a psychotic disorder. Her understanding and ability to describe reality is very limited by her cognitive problems. I have not actually seen her showing obvious psychotic symptoms although she does frequently seem somewhat confused. She is on multiple psychiatric medicines including apparently one or 2 antidepressants, possibly as many as 3 antipsychotics and lithium and Depakote. Since we have only seen her in emergency settings I really don't know what the history of how these things came to be prescribed is and I can't speak to how useful they really are. She does have a history of some aggression and self injury in the past. Doesn't seem to be acting in that manner currently.   I  looked through a lot of the notes in her care everywhere records from [place]. It looks like it has always been acknowledge that she has mental retardation of probably moderate severity. Also, the abnormal eye movements have always been noticed and are said to be present from birth. Psychiatrically, it looks like the diagnosis of "bipolar disorder" goes  back to childhood and puberty at which time she was mostly treated with mood stabilizers. Around 2012 she was diagnosed with a seizure disorder. At least by 2014 she was on the large array of multiple antipsychotics and mood stabilizers that she is taking now or something similar to it. All of this appears to be directed at behavior problems with impulsivity and sometimes dangerous acting out. I don't see any specific documentation about psychosis so much as behavior problems and impulsivity. It is not clear to me he really whether the medicines have been specifically helpful or not  Risk to Self: Is patient at risk for suicide?: No Risk to Others:   Prior Inpatient Therapy:   Prior Outpatient Therapy:    Past Medical History:  Past Medical History:  Diagnosis Date  . Developmental delay   . Mental retardation    Mild  . Paranoid schizophrenia ([place])   . Seizures ([place])     Past Surgical History:  Procedure Laterality Date  . ESOPHAGOGASTRODUODENOSCOPY (EGD) WITH PROPOFOL N/A 05/18/2015   Procedure: ESOPHAGOGASTRODUODENOSCOPY (EGD) WITH PROPOFOL;  Surgeon: Lollie Sails, MD;  Location[facility] ENDOSCOPY;  Service: Endoscopy;  Laterality: N/A;   Family History:  Family History  Problem Relation Age of Onset  . Family history unknown: Yes   Family Psychiatric  History: Unknown. Patient not able to give any history. Social History:  History  Alcohol Use No     History  Drug Use No    Social History   Social History  . Marital status: Single    Spouse name: N/A  . Number of children: 0  . Years of education: n/a   Occupational History  . Disabled    Social History Main Topics  . Smoking status: Never Smoker  . Smokeless tobacco: Never Used  . Alcohol use No  . Drug use: No  . Sexual activity: Not Asked   Other Topics Concern  . None   Social History Narrative   Pt currently resides at [facility].   Caffeine Use: None   Additional Social History:     Allergies:   Allergies  Allergen Reactions  . Ritalin [Methylphenidate Hcl] Other (See Comments)    Reaction:  Unknown     Labs:  Results for orders placed or performed during the hospital encounter of 05/22/16 (from the past 48 hour(s))  CBC with Differential     Status: Abnormal   Collection Time: 05/22/16  6:24 PM  Result Value Ref Range   WBC 13.9 (H) 3.6 - 11.0 K/uL   RBC 4.10 3.80 - 5.20 MIL/uL   Hemoglobin 12.8 12.0 - 16.0 g/dL   HCT 38.0 35.0 - 47.0 %   MCV 92.7 80.0 - 100.0 fL   MCH 31.3 26.0 - 34.0 pg   MCHC 33.7 32.0 - 36.0 g/dL   RDW 13.1 11.5 - 14.5 %   Platelets 227 150 - 440 K/uL   Neutrophils Relative % 67 %   Neutro Abs 9.4 (H) 1.4 - 6.5 K/uL   Lymphocytes Relative 24 %   Lymphs Abs 3.3 1.0 - 3.6 K/uL   Monocytes Relative 6 %  Monocytes Absolute 0.8 0.2 - 0.9 K/uL   Eosinophils Relative 3 %   Eosinophils Absolute 0.4 0 - 0.7 K/uL   Basophils Relative 0 %   Basophils Absolute 0.1 0 - 0.1 K/uL  Comprehensive metabolic panel     Status: Abnormal   Collection Time: 05/22/16  6:24 PM  Result Value Ref Range   Sodium 140 135 - 145 mmol/L   Potassium 3.8 3.5 - 5.1 mmol/L   Chloride 106 101 - 111 mmol/L   CO2 27 22 - 32 mmol/L   Glucose, Bld 102 (H) 65 - 99 mg/dL   BUN 15 6 - 20 mg/dL   Creatinine, Ser 0.97 0.44 - 1.00 mg/dL   Calcium 9.5 8.9 - 10.3 mg/dL   Total Protein 7.7 6.5 - 8.1 g/dL   Albumin 3.9 3.5 - 5.0 g/dL   AST 24 15 - 41 U/L   ALT 11 (L) 14 - 54 U/L   Alkaline Phosphatase 51 38 - 126 U/L   Total Bilirubin 0.7 0.3 - 1.2 mg/dL   GFR calc non Af Amer >60 >60 mL/min   GFR calc Af Amer >60 >60 mL/min    Comment: (NOTE) The eGFR has been calculated using the CKD EPI equation. This calculation has not been validated in all clinical situations. eGFR's persistently <60 mL/min signify possible Chronic Kidney Disease.    Anion gap 7 5 - 15  Valproic acid level     Status: None   Collection Time: 05/22/16  6:24 PM  Result Value Ref Range    Valproic Acid Lvl 57 50.0 - 100.0 ug/mL  Lithium level     Status: Abnormal   Collection Time: 05/22/16  6:24 PM  Result Value Ref Range   Lithium Lvl 0.25 (L) 0.60 - 1.20 mmol/L  Urinalysis complete, with microscopic ([facility] only)     Status: Abnormal   Collection Time: 05/22/16  6:24 PM  Result Value Ref Range   Color, Urine YELLOW (A) YELLOW   APPearance CLEAR (A) CLEAR   Glucose, UA NEGATIVE NEGATIVE mg/dL   Bilirubin Urine NEGATIVE NEGATIVE   Ketones, ur NEGATIVE NEGATIVE mg/dL   Specific Gravity, Urine 1.017 1.005 - 1.030   Hgb urine dipstick NEGATIVE NEGATIVE   pH 7.0 5.0 - 8.0   Protein, ur NEGATIVE NEGATIVE mg/dL   Nitrite NEGATIVE NEGATIVE   Leukocytes, UA NEGATIVE NEGATIVE   RBC / HPF 0-5 0 - 5 RBC/hpf   WBC, UA 0-5 0 - 5 WBC/hpf   Bacteria, UA NONE SEEN NONE SEEN   Squamous Epithelial / LPF 0-5 (A) NONE SEEN   Mucous PRESENT   Urine Drug Screen, Qualitative ([facility] only)     Status: Abnormal   Collection Time: 05/22/16  6:24 PM  Result Value Ref Range   Tricyclic, Ur Screen NONE DETECTED NONE DETECTED   Amphetamines, Ur Screen NONE DETECTED NONE DETECTED   MDMA (Ecstasy)Ur Screen NONE DETECTED NONE DETECTED   Cocaine Metabolite,Ur [place] NONE DETECTED NONE DETECTED   Opiate, Ur Screen NONE DETECTED NONE DETECTED   Phencyclidine (PCP) Ur S NONE DETECTED NONE DETECTED   Cannabinoid 50 Ng, Ur [place] NONE DETECTED NONE DETECTED   Barbiturates, Ur Screen NONE DETECTED NONE DETECTED   Benzodiazepine, Ur Scrn POSITIVE (A) NONE DETECTED   Methadone Scn, Ur NONE DETECTED NONE DETECTED    Comment: (NOTE) 962  Tricyclics, urine               Cutoff 1000 ng/mL 200  Amphetamines, urine  Cutoff 1000 ng/mL 300  MDMA (Ecstasy), urine           Cutoff 500 ng/mL 400  Cocaine Metabolite, urine       Cutoff 300 ng/mL 500  Opiate, urine                   Cutoff 300 ng/mL 600  Phencyclidine (PCP), urine      Cutoff 25 ng/mL 700  Cannabinoid, urine              Cutoff 50 ng/mL 800   Barbiturates, urine             Cutoff 200 ng/mL 900  Benzodiazepine, urine           Cutoff 200 ng/mL 1000 Methadone, urine                Cutoff 300 ng/mL 1100 1200 The urine drug screen provides only a preliminary, unconfirmed 1300 analytical test result and should not be used for non-medical 1400 purposes. Clinical consideration and professional judgment should 1500 be applied to any positive drug screen result due to possible 1600 interfering substances. A more specific alternate chemical method 1700 must be used in order to obtain a confirmed analytical result.  1800 Gas chromato graphy / mass spectrometry (GC/MS) is the preferred 1900 confirmatory method.   Troponin I     Status: None   Collection Time: 05/22/16  6:24 PM  Result Value Ref Range   Troponin I <0.03 <0.03 ng/mL  Basic metabolic panel     Status: Abnormal   Collection Time: 05/23/16  1:33 AM  Result Value Ref Range   Sodium 139 135 - 145 mmol/L   Potassium 3.0 (L) 3.5 - 5.1 mmol/L   Chloride 110 101 - 111 mmol/L   CO2 23 22 - 32 mmol/L   Glucose, Bld 176 (H) 65 - 99 mg/dL   BUN 14 6 - 20 mg/dL   Creatinine, Ser 1.10 (H) 0.44 - 1.00 mg/dL   Calcium 9.0 8.9 - 10.3 mg/dL   GFR calc non Af Amer >60 >60 mL/min   GFR calc Af Amer >60 >60 mL/min    Comment: (NOTE) The eGFR has been calculated using the CKD EPI equation. This calculation has not been validated in all clinical situations. eGFR's persistently <60 mL/min signify possible Chronic Kidney Disease.    Anion gap 6 5 - 15  CBC     Status: None   Collection Time: 05/23/16  1:33 AM  Result Value Ref Range   WBC 10.5 3.6 - 11.0 K/uL   RBC 3.93 3.80 - 5.20 MIL/uL   Hemoglobin 12.5 12.0 - 16.0 g/dL   HCT 36.4 35.0 - 47.0 %   MCV 92.6 80.0 - 100.0 fL   MCH 31.7 26.0 - 34.0 pg   MCHC 34.3 32.0 - 36.0 g/dL   RDW 12.9 11.5 - 14.5 %   Platelets 203 150 - 440 K/uL  Valproic acid level     Status: Abnormal   Collection Time: 05/23/16  1:33 AM  Result Value  Ref Range   Valproic Acid Lvl 32 (L) 50.0 - 100.0 ug/mL  Magnesium     Status: None   Collection Time: 05/23/16  1:33 AM  Result Value Ref Range   Magnesium 2.1 1.7 - 2.4 mg/dL  MRSA PCR Screening     Status: None   Collection Time: 05/23/16  1:59 AM  Result Value Ref Range   MRSA by PCR NEGATIVE NEGATIVE  Comment:        The GeneXpert MRSA Assay (FDA approved for NASAL specimens only), is one component of a comprehensive MRSA colonization surveillance program. It is not intended to diagnose MRSA infection nor to guide or monitor treatment for MRSA infections.     Current Facility-Administered Medications  Medication Dose Route Frequency Provider Last Rate Last Dose  . acetaminophen (TYLENOL) tablet 650 mg  650 mg Oral Q6H PRN Lance Coon, MD       Or  . acetaminophen (TYLENOL) suppository 650 mg  650 mg Rectal Q6H PRN Lance Coon, MD      . ARIPiprazole (ABILIFY) tablet 15 mg  15 mg Oral Daily Gonzella Lex, MD      . benztropine (COGENTIN) tablet 1 mg  1 mg Oral BID Lance Coon, MD   1 mg at 05/23/16 3382  . buPROPion (WELLBUTRIN XL) 24 hr tablet 150 mg  150 mg Oral Daily Lance Coon, MD   150 mg at 05/23/16 0921  . cloNIDine (CATAPRES - Dosed in mg/24 hr) patch 0.3 mg  0.3 mg Transdermal Weekly Demetrios Loll, MD   0.3 mg at 05/23/16 1738  . diphenhydrAMINE (BENADRYL) capsule 50 mg  50 mg Oral Daily Alexis Goodell, MD   50 mg at 05/23/16 1331  . divalproex (DEPAKOTE SPRINKLE) capsule 375 mg  375 mg Oral Daily Lance Coon, MD   375 mg at 05/23/16 1109   And  . divalproex (DEPAKOTE SPRINKLE) capsule 750 mg  750 mg Oral Q2200 Lance Coon, MD   750 mg at 05/23/16 0157  . docusate sodium (COLACE) capsule 100 mg  100 mg Oral BID Demetrios Loll, MD      . enoxaparin (LOVENOX) injection 40 mg  40 mg Subcutaneous QHS Lance Coon, MD   40 mg at 05/23/16 0156  . [START ON 05/24/2016] Influenza vac split quadrivalent PF (FLUARIX) injection 0.5 mL  0.5 mL Intramuscular Tomorrow-1000  Lance Coon, MD      . lithium carbonate (LITHOBID) CR tablet 600 mg  600 mg Oral QHS Gonzella Lex, MD      . LORazepam (ATIVAN) tablet 0.5 mg  0.5 mg Oral Q8H PRN Lance Coon, MD      . LORazepam (ATIVAN) tablet 1 mg  1 mg Oral BID Lance Coon, MD   1 mg at 05/23/16 5053  . ondansetron (ZOFRAN) tablet 4 mg  4 mg Oral Q6H PRN Lance Coon, MD       Or  . ondansetron [facility]) injection 4 mg  4 mg Intravenous Q6H PRN Lance Coon, MD      . pantoprazole (PROTONIX) EC tablet 40 mg  40 mg Oral QAC breakfast Lance Coon, MD   40 mg at 05/23/16 9767  . PARoxetine (PAXIL) tablet 20 mg  20 mg Oral Daily Lance Coon, MD   20 mg at 05/23/16 3419  . potassium chloride SA (K-DUR,KLOR-CON) CR tablet 40 mEq  40 mEq Oral BID Demetrios Loll, MD   40 mEq at 05/23/16 0921  . valACYclovir (VALTREX) tablet 2,000 mg  2,000 mg Oral BID PRN Lance Coon, MD        Musculoskeletal: Strength & Muscle Tone: decreased Gait & Station: unsteady Patient leans: N/A  Psychiatric Specialty Exam: Physical Exam  Nursing note and vitals reviewed. Constitutional: She appears well-developed and well-nourished.  HENT:  Head: Normocephalic and atraumatic.  Eyes: Conjunctivae are normal. Pupils are equal, round, and reactive to light.  Neck: Normal range of motion.  Cardiovascular: Normal  heart sounds.   Respiratory: Effort normal.  GI: Soft.  Musculoskeletal: Normal range of motion.  Neurological: She is alert.  Patient has chronic nystagmus or similar movements of her eyes. Patient is not showing any stiffness typical of dystonia.  Skin: Skin is warm and dry.  Psychiatric: Her affect is blunt. Her speech is delayed. She is slowed and withdrawn. She expresses inappropriate judgment. She expresses no homicidal and no suicidal ideation. She exhibits abnormal recent memory and abnormal remote memory.    Review of Systems  Constitutional: Negative.   HENT: Negative.   Eyes: Negative.   Respiratory: Negative.    Cardiovascular: Negative.   Gastrointestinal: Negative.   Musculoskeletal: Negative.   Skin: Negative.   Neurological: Negative.   Psychiatric/Behavioral: Negative for depression, hallucinations, memory loss, substance abuse and suicidal ideas. The patient is not nervous/anxious and does not have insomnia.     Blood pressure (!) 107/54, pulse 99, temperature 98.6 F (37 C), temperature source Oral, resp. rate 20, height _0  (1.575 m), weight 72.6 kg (160 lb), SpO2 100 %.Body mass index is 29.26 kg/m.  General Appearance: Casual  Eye Contact:  Minimal  Speech:  Garbled, Slow and Slurred  Volume:  Decreased  Mood:  Euthymic  Affect:  Constricted  Thought Process:  Disorganized  Orientation:  Other:  Know she is in the hospital.  Thought Content:  Illogical and Rumination  Suicidal Thoughts:  No  Homicidal Thoughts:  No  Memory:  Immediate;   Fair Recent;   Fair Remote;   Poor  Judgement:  Impaired  Insight:  Shallow  Psychomotor Activity:  Normal  Concentration:  Concentration: Poor  Recall:  Poor  Fund of Knowledge:  Poor  Language:  Poor  Akathisia:  No  Handed:  Right  AIMS (if indicated):     Assets:  Financial Resources/Insurance Housing Social Support  ADL's:  Intact  Cognition:  Impaired,  Moderate  Sleep:        Treatment Plan Summary: Daily contact with patient to assess and evaluate symptoms and progress in treatment, Medication management and Plan 25 year old woman with intellectual disability and probably other chronic neurologic and physical impairments. I didn't see her in the emergency room so I can't speak to whether she was having an oculogyric crisis at that time but I doubt it very much. She's been on large doses of antipsychotics for years without any evidence of dystonia. I suspect that the tele-psychiatry person was mistaking her chronic abnormal eye movements for an oculogyric crisis, which they are not. The other hand, I don't really know how much  of her current psychiatric medicine is necessary or appropriate. This is a difficult situation when someone like this has over time accumulated multiple medications. Since we will not be following her chronically it is hard to know whether any changes to her medicine will be for the better or worse. Ideally it would be up to her outpatient psychiatric provider to make those decisions. I doubt however that all of the medicine is really necessary. It sounds like her current mental status and the mental status I have seen in her emergency room visits is about the same as it has pretty much always been on medicine or without it. I am going to restart her lithium. Allow her to continue the Depakote especially since she has this supposedly seizure disorder diagnosis. I suggest restarting the Abilify 15 mg a day and making the Seroquel when necessary. This seems reasonable for now although her  outpatient providers then going ahead in the future can decide on what is really appropriate. I will follow-up as needed.  Disposition: No evidence of imminent risk to self or others at present.   Patient does not meet criteria for psychiatric inpatient admission. Supportive therapy provided about ongoing stressors.  Alethia Berthold, MD 05/23/2016 6:32 PM

## 2016-05-24 DIAGNOSIS — H518 Other specified disorders of binocular movement: Secondary | ICD-10-CM | POA: Diagnosis not present

## 2016-05-24 LAB — BASIC METABOLIC PANEL
Anion gap: 3 — ABNORMAL LOW (ref 5–15)
BUN: 13 mg/dL (ref 6–20)
CHLORIDE: 111 mmol/L (ref 101–111)
CO2: 27 mmol/L (ref 22–32)
CREATININE: 1.16 mg/dL — AB (ref 0.44–1.00)
Calcium: 9.3 mg/dL (ref 8.9–10.3)
GFR calc Af Amer: >60 mL/min (ref >60)
GFR calc non Af Amer: >60 mL/min (ref >60)
GLUCOSE: 105 mg/dL — AB (ref 65–99)
POTASSIUM: 4.7 mmol/L (ref 3.5–5.1)
SODIUM: 141 mmol/L (ref 135–145)

## 2016-05-24 MED ORDER — QUETIAPINE FUMARATE 50 MG PO TABS: 50.0000 mg | ORAL_TABLET | Freq: Four times a day (QID) | ORAL | Status: AC | PRN

## 2016-05-24 MED ORDER — BENZTROPINE MESYLATE 1 MG PO TABS: 1.0000 mg | ORAL_TABLET | Freq: Two times a day (BID) | ORAL | 0 refills | Status: AC

## 2016-05-24 MED ORDER — DIPHENHYDRAMINE HCL 50 MG PO CAPS: 50.0000 mg | ORAL_CAPSULE | Freq: Every day | ORAL | 0 refills | Status: AC

## 2016-05-24 NOTE — Discharge Summary (Signed)
[facility]   PATIENT NAME: Hannah Keller    MR#:  [id number]  DATE OF BIRTH:  29-Sep-1990  DATE OF ADMISSION:  05/22/2016   ADMITTING PHYSICIAN: Oralia Manis, MD  DATE OF DISCHARGE:  05/24/2016 PRIMARY CARE PHYSICIAN: Domenic Schwab, FNP   ADMISSION DIAGNOSIS:  Encephalopathy [G93.40] Dystonic drug reaction [G24.02] DISCHARGE DIAGNOSIS:  Principal Problem:   Oculogyric crisis Active Problems:   Mental retardation, idiopathic moderate   Paranoid schizophrenia (HCC)   Seizures (HCC)  SECONDARY DIAGNOSIS:   Past Medical History:  Diagnosis Date  . Developmental delay   . Mental retardation    Mild  . Paranoid schizophrenia (HCC)   . Seizures [facility])    HOSPITAL COURSE:   Oculogyric crisis - diphenhydramine given in the ED with some reported improvement in symptoms, Benadryl daily at 50mg . Agree with increase in Cogentin per Dr. Jodi Mourning, Neurologist. Still has nystagmus. Per Dr. Toni Amend, it is the same as before.  Paranoid schizophrenia (HCC) - continue some home meds, per Dr. Toni Amend, restarted Abilify, lithium and Seroquel prn, patient is stable to go back to group home.  Seizures (HCC) - continue home meds, valproic acid level 57, 32. Hypokalemia. Given potassium supplement, improved.  I discussed with Dr. Jodi Mourning.  DISCHARGE CONDITIONS:  Stable, discharge to group home today. CONSULTS OBTAINED:  Treatment Team:  Thana Farr, MD Audery Amel, MD DRUG ALLERGIES:   Allergies  Allergen Reactions  . Ritalin [Methylphenidate Hcl] Other (See Comments)    Reaction:  Unknown    DISCHARGE MEDICATIONS:     Medication List    TAKE these medications   acetaminophen 325 MG tablet Commonly known as:  TYLENOL Take 325 mg by mouth every 6 (six) hours as needed for mild pain, fever or headache.   ARIPiprazole 15 MG tablet Commonly known as:  ABILIFY Take 15 mg by mouth daily at 12 noon.     benztropine 1 MG tablet Commonly known as:  COGENTIN Take 1 tablet (1 mg total) by mouth 2 (two) times daily. What changed:  medication strength  how much to take   buPROPion 150 MG 24 hr tablet Commonly known as:  WELLBUTRIN XL Take 150 mg by mouth daily.   cholecalciferol 1000 units tablet Commonly known as:  VITAMIN D Take 1,000 Units by mouth 2 (two) times daily.   cloNIDine 0.3 mg/24hr patch Commonly known as:  CATAPRES - Dosed in mg/24 hr Place 0.3 mg onto the skin once a week. Pt applies on Friday.   diphenhydrAMINE 50 MG capsule Commonly known as:  BENADRYL Take 1 capsule (50 mg total) by mouth daily.   divalproex 125 MG capsule Commonly known as:  DEPAKOTE SPRINKLE Take 375-750 mg by mouth 2 (two) times daily. 375mg  in the morning and 750mg  at night   docusate sodium 100 MG capsule Commonly known as:  COLACE Take 200 mg by mouth daily.   ferrous sulfate 325 (65 FE) MG tablet Take 325 mg by mouth daily.   hydrochlorothiazide 25 MG tablet Commonly known as:  HYDRODIURIL Take 25 mg by mouth daily.   lithium carbonate 300 MG capsule Take 1 capsule (300 mg total) by mouth at bedtime. What changed:  how much to take   LORazepam 1 MG tablet Commonly known as:  ATIVAN Take 1 mg by mouth 2 (two) times daily.   LORazepam 0.5 MG tablet Commonly known as:  ATIVAN Take 0.5 mg by mouth every 8 (eight) hours as needed for  anxiety.   meclizine 25 MG tablet Commonly known as:  ANTIVERT Take 25 mg by mouth 3 (three) times daily as needed for dizziness.   medroxyPROGESTERone 150 MG/ML injection Commonly known as:  DEPO-PROVERA Inject 150 mg into the muscle every 3 (three) months.   naltrexone 50 MG tablet Commonly known as:  DEPADE Take 50 mg by mouth daily.   omeprazole 20 MG capsule Commonly known as:  PRILOSEC Take 20 mg by mouth daily.   PARoxetine 20 MG tablet Commonly known as:  PAXIL Take 20 mg by mouth daily.   PATADAY 0.2 % Soln Generic drug:   Olopatadine HCl Apply 1 drop to eye daily as needed (for allergies).   polyethylene glycol packet Commonly known as:  MIRALAX / GLYCOLAX Take 17 g by mouth every other day.   QUEtiapine 50 MG tablet Commonly known as:  SEROQUEL Take 1 tablet (50 mg total) by mouth every 6 (six) hours as needed. What changed:  when to take this  reasons to take this   SAPHRIS 10 MG Subl Generic drug:  Asenapine Maleate Place 1 tablet under the tongue 3 (three) times daily.   solifenacin 10 MG tablet Commonly known as:  VESICARE Take 10 mg by mouth daily.   valACYclovir 1000 MG tablet Commonly known as:  VALTREX Take 2,000 mg by mouth 2 (two) times daily as needed (for fever blisters).        DISCHARGE INSTRUCTIONS:  See AVS. If you experience worsening of your admission symptoms, develop shortness of breath, life threatening emergency, suicidal or homicidal thoughts you must seek medical attention immediately by calling 911 or calling your MD immediately  if symptoms less severe.  You Must read complete instructions/literature along with all the possible adverse reactions/side effects for all the Medicines you take and that have been prescribed to you. Take any new Medicines after you have completely understood and accpet all the possible adverse reactions/side effects.   Please note  You were cared for by a hospitalist during your hospital stay. If you have any questions about your discharge medications or the care you received while you were in the hospital after you are discharged, you can call the unit and asked to speak with the hospitalist on call if the hospitalist that took care of you is not available. Once you are discharged, your primary care physician will handle any further medical issues. Please note that NO REFILLS for any discharge medications will be authorized once you are discharged, as it is imperative that you return to your primary care physician (or establish a  relationship with a primary care physician if you do not have one) for your aftercare needs so that they can reassess your need for medications and monitor your lab values.    On the day of Discharge:  VITAL SIGNS:  Blood pressure (!) 113/54, pulse 63, temperature 97.9 F (36.6 C), temperature source Oral, resp. rate 20, height 5\' 2"  (1.575 m), weight 160 lb (72.6 kg), SpO2 99 %. PHYSICAL EXAMINATION:  GENERAL:  25 y.o.-year-old patient lying in the bed with no acute distress.  EYES: Pupils equal, round, reactive to light and accommodation. No scleral icterus. Extraocular muscles intact.  HEENT: Head atraumatic, normocephalic. Oropharynx and nasopharynx clear.  NECK:  Supple, no jugular venous distention. No thyroid enlargement, no tenderness.  LUNGS: Normal breath sounds bilaterally, no wheezing, rales,rhonchi or crepitation. No use of accessory muscles of respiration.  CARDIOVASCULAR: S1, S2 normal. No murmurs, rubs, or gallops.  ABDOMEN:  Soft, non-tender, non-distended. Bowel sounds present. No organomegaly or mass.  EXTREMITIES: No pedal edema, cyanosis, or clubbing.  NEUROLOGIC: Cranial nerves II through XII are intact. Nystagmus. Muscle strength 5/5 in all extremities. Sensation intact. Gait not checked.  PSYCHIATRIC: The patient is alert and oriented x 3.  SKIN: No obvious rash, lesion, or ulcer.  DATA REVIEW:   CBC  Recent Labs Lab 05/23/16 0133  WBC 10.5  HGB 12.5  HCT 36.4  PLT 203    Chemistries   Recent Labs Lab 05/22/16 1824 05/23/16 0133 05/24/16 0416  NA 140 139 141  K 3.8 3.0* 4.7  CL 106 110 111  CO2 27 23 27   GLUCOSE 102* 176* 105*  BUN 15 14 13   CREATININE 0.97 1.10* 1.16*  CALCIUM 9.5 9.0 9.3  MG  --  2.1  --   AST 24  --   --   ALT 11*  --   --   ALKPHOS 51  --   --   BILITOT 0.7  --   --      Microbiology Results  Results for orders placed or performed during the hospital encounter of 05/22/16  MRSA PCR Screening     Status: None    Collection Time: 05/23/16  1:59 AM  Result Value Ref Range Status   MRSA by PCR NEGATIVE NEGATIVE Final    Comment:        The GeneXpert MRSA Assay (FDA approved for NASAL specimens only), is one component of a comprehensive MRSA colonization surveillance program. It is not intended to diagnose MRSA infection nor to guide or monitor treatment for MRSA infections.     RADIOLOGY:  No results found.   Management plans discussed with the patient, family and they are in agreement.  CODE STATUS:     Code Status Orders        Start     Ordered   05/23/16 0113  Full code  Continuous     05/23/16 0112    Code Status History    Date Active Date Inactive Code Status Order ID Comments User Context   03/29/2015  7:52 PM 04/06/2015  4:08 PM Full Code [id number]  Houston SirenVivek J Sainani, MD ED      TOTAL TIME TAKING CARE OF THIS PATIENT: 32 minutes.    Shaune Pollackhen, Syniah Berne M.D on 05/24/2016 at 11:49 AM  Between 7am to 6pm - Pager - [phone]  After 6pm go to [email or web address] - Social research officer, [email or web address] [email or web address]  [facility]  Office  [phone]  CC: Primary care physician; Domenic SchwabLindley,Cheryl Paulette, FNP   Note: This dictation was prepared with Dragon dictation along with smaller phrase technology. Any transcriptional errors that result from this process are unintentional.

## 2016-05-24 NOTE — Progress Notes (Signed)
Subjective: Patient reports vision improved.    Objective: Current vital signs: BP (!) 113/54 (BP Location: Right Arm)   Pulse 63   Temp 97.9 F (36.6 C) (Oral)   Resp 20   Ht 5\' 2"  (1.575 m)   Wt 72.6 kg (160 lb)   LMP  (LMP Unknown)   SpO2 99%   BMI 29.26 kg/m  Vital signs in last 24 hours: Temp:  [97.9 F (36.6 C)-98 F (36.7 C)] 97.9 F (36.6 C) (10/25 0448) Pulse Rate:  [63-87] 63 (10/25 0455) Resp:  [19-20] 20 (10/25 0448) BP: (101-120)/(47-54) 113/54 (10/25 0455) SpO2:  [99 %-100 %] 99 % (10/25 0448)  Intake/Output from previous day: 10/24 0701 - 10/25 0700 In: 720 [P.O.:720] Out: -  Intake/Output this shift: Total I/O In: 200 [P.O.:200] Out: -  Nutritional status: Diet Heart Room service appropriate? Yes; Fluid consistency: Thin Diet - low sodium heart healthy  Neurologic Exam: Mental Status: Alert and awake.  Speech fluent without evidence of aphasia.  Able to follow 3 step commands without difficulty. Cranial Nerves: II: Discs flat bilaterally; Visual fields grossly normal, pupils equal, round, reactive to light and accommodation III,IV, VI: ptosis not present, nystagmus improved V,VII: smile symmetric, facial light touch sensation normal bilaterally VIII: hearing normal bilaterally IX,X: gag reflex present XI: bilateral shoulder shrug XII: midline tongue extension Motor: 5/5 strength throughout    Lab Results: Basic Metabolic Panel:  Recent Labs Lab 05/18/16 1608 05/20/16 1220 05/22/16 1824 05/23/16 0133 05/24/16 0416  NA 140 137 140 139 141  K 3.3* 3.6 3.8 3.0* 4.7  CL 109 107 106 110 111  CO2 24 23 27 23 27   GLUCOSE 124* 123* 102* 176* 105*  BUN 18 16 15 14 13   CREATININE 1.34* 1.17* 0.97 1.10* 1.16*  CALCIUM 9.5 9.2 9.5 9.0 9.3  MG  --   --   --  2.1  --     Liver Function Tests:  Recent Labs Lab 05/18/16 1608 05/20/16 1220 05/22/16 1824  AST 25 24 24   ALT 11* 11* 11*  ALKPHOS 50 46 51  BILITOT 0.2* 0.3 0.7  PROT 7.3 7.3  7.7  ALBUMIN 3.7 3.8 3.9   No results for input(s): LIPASE, AMYLASE in the last 168 hours. No results for input(s): AMMONIA in the last 168 hours.  CBC:  Recent Labs Lab 05/18/16 1608 05/20/16 1220 05/22/16 1824 05/23/16 0133  WBC 9.5 10.4 13.9* 10.5  NEUTROABS  --   --  9.4*  --   HGB 12.4 12.6 12.8 12.5  HCT 36.5 38.3 38.0 36.4  MCV 92.3 93.6 92.7 92.6  PLT 211 222 227 203    Cardiac Enzymes:  Recent Labs Lab 05/22/16 1824  TROPONINI <0.03    Lipid Panel: No results for input(s): CHOL, TRIG, HDL, CHOLHDL, VLDL, LDLCALC in the last 168 hours.  CBG: No results for input(s): GLUCAP in the last 168 hours.  Microbiology: Results for orders placed or performed during the hospital encounter of 05/22/16  MRSA PCR Screening     Status: None   Collection Time: 05/23/16  1:59 AM  Result Value Ref Range Status   MRSA by PCR NEGATIVE NEGATIVE Final    Comment:        The GeneXpert MRSA Assay (FDA approved for NASAL specimens only), is one component of a comprehensive MRSA colonization surveillance program. It is not intended to diagnose MRSA infection nor to guide or monitor treatment for MRSA infections.     Coagulation Studies:  No results for input(s): LABPROT, INR in the last 72 hours.  Imaging: Ct Angio Head W Or Wo Contrast  Result Date: 05/22/2016 CLINICAL DATA:  25 y/o F; right neck, fine, and foot pain. Status post fall last evening. Additional complaints of tremors to the right leg and numbness of right arm with nystagmus. EXAM: CT ANGIOGRAPHY HEAD AND NECK TECHNIQUE: Multidetector CT imaging of the head and neck was performed using the standard protocol during bolus administration of intravenous contrast. Multiplanar CT image reconstructions and MIPs were obtained to evaluate the vascular anatomy. Carotid stenosis measurements (when applicable) are obtained utilizing NASCET criteria, using the distal internal carotid diameter as the denominator. CONTRAST:   75 cc Isovue 370 COMPARISON:  05/22/2016 CT of the head. FINDINGS: CT HEAD FINDINGS Brain: No evidence of acute infarction, hemorrhage, hydrocephalus, extra-axial collection or mass lesion/mass effect. Vascular: See below. Skull: Negative for fracture. Extensive hyperostosis frontalis interna. Sinuses: Imaged portions are clear. Orbits: No acute finding. Review of the MIP images confirms the above findings CTA NECK FINDINGS Aortic arch: Standard branching. Imaged portion shows no evidence of aneurysm or dissection. No significant stenosis of the major arch vessel origins. Right carotid system: No evidence of dissection, stenosis (50% or greater) or occlusion. Left carotid system: No evidence of dissection, stenosis (50% or greater) or occlusion. Vertebral arteries: Codominant. No evidence of dissection, stenosis (50% or greater) or occlusion. Skeleton: Mild cervical spondylosis greatest at the C5-6 level with there are endplate degenerative changes a large anterior marginal osteophytes. Other neck: Negative. Upper chest: Clear Review of the MIP images confirms the above findings CTA HEAD FINDINGS Anterior circulation: No significant stenosis, proximal occlusion, aneurysm, or vascular malformation. Posterior circulation: No significant stenosis, proximal occlusion, aneurysm, or vascular malformation. Venous sinuses: As permitted by contrast timing, patent. Anatomic variants: Bilateral fetal PCA. The right vertebral artery largely terminates in the right PICC up with minimal contribution to basilar. Delayed phase: No abnormal intracranial enhancement. Review of the MIP images confirms the above findings IMPRESSION: 1. No acute intracranial abnormality is identified. If symptoms persist or if clinically indicated MRI is more sensitive for acute stroke. 2. No evidence for dissection significant stenosis or occlusion of the carotid and vertebral arteries of the neck. 3. No significant stenosis, proximal occlusion,  aneurysm, or vascular malformation of the circle of Willis. Electronically Signed   By: Mitzi Hansen M.D.   On: 05/22/2016 19:17   Ct Angio Neck W And/or Wo Contrast  Result Date: 05/22/2016 CLINICAL DATA:  25 y/o F; right neck, fine, and foot pain. Status post fall last evening. Additional complaints of tremors to the right leg and numbness of right arm with nystagmus. EXAM: CT ANGIOGRAPHY HEAD AND NECK TECHNIQUE: Multidetector CT imaging of the head and neck was performed using the standard protocol during bolus administration of intravenous contrast. Multiplanar CT image reconstructions and MIPs were obtained to evaluate the vascular anatomy. Carotid stenosis measurements (when applicable) are obtained utilizing NASCET criteria, using the distal internal carotid diameter as the denominator. CONTRAST:  75 cc Isovue 370 COMPARISON:  05/22/2016 CT of the head. FINDINGS: CT HEAD FINDINGS Brain: No evidence of acute infarction, hemorrhage, hydrocephalus, extra-axial collection or mass lesion/mass effect. Vascular: See below. Skull: Negative for fracture. Extensive hyperostosis frontalis interna. Sinuses: Imaged portions are clear. Orbits: No acute finding. Review of the MIP images confirms the above findings CTA NECK FINDINGS Aortic arch: Standard branching. Imaged portion shows no evidence of aneurysm or dissection. No significant stenosis of the major  arch vessel origins. Right carotid system: No evidence of dissection, stenosis (50% or greater) or occlusion. Left carotid system: No evidence of dissection, stenosis (50% or greater) or occlusion. Vertebral arteries: Codominant. No evidence of dissection, stenosis (50% or greater) or occlusion. Skeleton: Mild cervical spondylosis greatest at the C5-6 level with there are endplate degenerative changes a large anterior marginal osteophytes. Other neck: Negative. Upper chest: Clear Review of the MIP images confirms the above findings CTA HEAD FINDINGS  Anterior circulation: No significant stenosis, proximal occlusion, aneurysm, or vascular malformation. Posterior circulation: No significant stenosis, proximal occlusion, aneurysm, or vascular malformation. Venous sinuses: As permitted by contrast timing, patent. Anatomic variants: Bilateral fetal PCA. The right vertebral artery largely terminates in the right PICC up with minimal contribution to basilar. Delayed phase: No abnormal intracranial enhancement. Review of the MIP images confirms the above findings IMPRESSION: 1. No acute intracranial abnormality is identified. If symptoms persist or if clinically indicated MRI is more sensitive for acute stroke. 2. No evidence for dissection significant stenosis or occlusion of the carotid and vertebral arteries of the neck. 3. No significant stenosis, proximal occlusion, aneurysm, or vascular malformation of the circle of Willis. Electronically Signed   By: Mitzi HansenLance  Furusawa-Stratton M.D.   On: 05/22/2016 19:17    Medications:  I have reviewed the patient's current medications. Scheduled: . ARIPiprazole  15 mg Oral Daily  . benztropine  1 mg Oral BID  . buPROPion  150 mg Oral Daily  . cloNIDine  0.3 mg Transdermal Weekly  . diphenhydrAMINE  50 mg Oral Daily  . divalproex  375 mg Oral Daily   And  . divalproex  750 mg Oral Q2200  . docusate sodium  100 mg Oral BID  . enoxaparin (LOVENOX) injection  40 mg Subcutaneous QHS  . lithium carbonate  600 mg Oral QHS  . LORazepam  1 mg Oral BID  . pantoprazole  40 mg Oral QAC breakfast  . PARoxetine  20 mg Oral Daily    Assessment/Plan: Patient with some improvement on current regimen.  To continue on Cogentin and Benadryl.  Follow up with neurology as an outpatient.   LOS: 0 days   Thana FarrLeslie Ronell Boldin, MD Neurology [phone] 05/24/2016  2:44 PM

## 2016-05-24 NOTE — Discharge Instructions (Signed)
Regular diet

## 2016-05-24 NOTE — [place] FL2 (Signed)
[place] MEDICAID FL2 LEVEL OF CARE SCREENING TOOL     IDENTIFICATION  Patient Name: Hannah Keller Birthdate: [birth year] Sex: female Admission Date (Current Location): 05/22/2016  [facility] and [place] Number:  Chiropodist and Address:  [facility], [address], [place], [place] [address]      Provider Number: [id number]  Attending Physician Name and Address:  Shaune Pollack, MD  Relative Name and Phone Number:       Current Level of Care: Hospital Recommended Level of Care: Family Care Home Prior Approval Number:    Date Approved/Denied:   PASRR Number:    Discharge Plan: Other (Comment) (return to group home)    Current Diagnoses: Patient Active Problem List   Diagnosis Date Noted  . Seizures (HCC) 05/22/2016  . 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 Paranoid schizophrenia (HCC) 04/12/2016  . Acute renal failure syndrome (HCC)   . AV block, Mobitz II   . Sinus pause   . Lithium toxicity 03/29/2015  . Mental retardation, idiopathic moderate 12/01/2014    Orientation RESPIRATION BLADDER Height & Weight     Self, Place  Normal Continent Weight: 160 lb (72.6 kg) Height:  5\' 2"  (157.5 cm)  BEHAVIORAL SYMPTOMS/MOOD NEUROLOGICAL BOWEL NUTRITION STATUS    Convulsions/Seizures (hx) Continent Diet (regular)  AMBULATORY STATUS COMMUNICATION OF NEEDS Skin   Supervision Verbally Normal                       Personal Care Assistance Level of Assistance  Bathing, Feeding, Dressing Bathing Assistance: Limited assistance Feeding assistance: Independent Dressing Assistance: Limited assistance     Functional Limitations Info  Sight, Hearing, Speech Sight Info: Adequate Hearing Info: Adequate Speech Info: Adequate    SPECIAL CARE FACTORS FREQUENCY                       Contractures Contractures Info: Not present    Additional Factors Info  Code Status, Allergies, Psychotropic Code Status Info: Full Code Allergies Info:  Ritalin Methylphenidate  Psychotropic Info: see medications         Current Medications (05/24/2016):  This is the current hospital active medication list Current Facility-Administered Medications  Medication Dose Route Frequency Provider Last Rate Last Dose  . acetaminophen (TYLENOL) tablet 650 mg  650 mg Oral Q6H PRN Oralia Manis, MD       Or  . acetaminophen (TYLENOL) suppository 650 mg  650 mg Rectal Q6H PRN Oralia Manis, MD      . ARIPiprazole (ABILIFY) tablet 15 mg  15 mg Oral Daily Audery Amel, MD   15 mg at 05/24/16 0943  . benztropine (COGENTIN) tablet 1 mg  1 mg Oral BID Oralia Manis, MD   1 mg at 05/24/16 0943  . buPROPion (WELLBUTRIN XL) 24 hr tablet 150 mg  150 mg Oral Daily Oralia Manis, MD   150 mg at 05/24/16 0943  . cloNIDine (CATAPRES - Dosed in mg/24 hr) patch 0.3 mg  0.3 mg Transdermal Weekly Shaune Pollack, MD   0.3 mg at 05/23/16 1738  . diphenhydrAMINE (BENADRYL) capsule 50 mg  50 mg Oral Daily Thana Farr, MD   50 mg at 05/24/16 0942  . divalproex (DEPAKOTE SPRINKLE) capsule 375 mg  375 mg Oral Daily Oralia Manis, MD   375 mg at 05/24/16 0944   And  . divalproex (DEPAKOTE SPRINKLE) capsule 750 mg  750 mg Oral Q2200 Oralia Manis, MD   750 mg at  05/23/16 2050  . docusate sodium (COLACE) capsule 100 mg  100 mg Oral BID Shaune Pollack, MD   100 mg at 05/23/16 2100  . enoxaparin (LOVENOX) injection 40 mg  40 mg Subcutaneous QHS Oralia Manis, MD   40 mg at 05/23/16 2051  . lithium carbonate (LITHOBID) CR tablet 600 mg  600 mg Oral QHS Audery Amel, MD   600 mg at 05/23/16 2052  . LORazepam (ATIVAN) tablet 0.5 mg  0.5 mg Oral Q8H PRN Oralia Manis, MD      . LORazepam (ATIVAN) tablet 1 mg  1 mg Oral BID Oralia Manis, MD   1 mg at 05/24/16 1610  . ondansetron (ZOFRAN) tablet 4 mg  4 mg Oral Q6H PRN Oralia Manis, MD       Or  . ondansetron [facility]) injection 4 mg  4 mg Intravenous Q6H PRN Oralia Manis, MD      . pantoprazole (PROTONIX) EC tablet 40 mg  40 mg Oral QAC  breakfast Oralia Manis, MD   40 mg at 05/23/16 9604  . PARoxetine (PAXIL) tablet 20 mg  20 mg Oral Daily Oralia Manis, MD   20 mg at 05/24/16 0943  . QUEtiapine (SEROQUEL) tablet 50 mg  50 mg Oral Q6H PRN Audery Amel, MD      . valACYclovir (VALTREX) tablet 2,000 mg  2,000 mg Oral BID PRN Oralia Manis, MD         Discharge Medications: DISCHARGE MEDICATIONS:     Medication List    TAKE these medications   acetaminophen 325 MG tablet Commonly known as:  TYLENOL Take 325 mg by mouth every 6 (six) hours as needed for mild pain, fever or headache.  ARIPiprazole 15 MG tablet Commonly known as:  ABILIFY Take 15 mg by mouth daily at 12 noon.  benztropine 1 MG tablet Commonly known as:  COGENTIN Take 1 tablet (1 mg total) by mouth 2 (two) times daily. What changed:  medication strength  how much to take  buPROPion 150 MG 24 hr tablet Commonly known as:  WELLBUTRIN XL Take 150 mg by mouth daily.  cholecalciferol 1000 units tablet Commonly known as:  VITAMIN D Take 1,000 Units by mouth 2 (two) times daily.  cloNIDine 0.3 mg/24hr patch Commonly known as:  CATAPRES - Dosed in mg/24 hr Place 0.3 mg onto the skin once a week. Pt applies on Friday.  diphenhydrAMINE 50 MG capsule Commonly known as:  BENADRYL Take 1 capsule (50 mg total) by mouth daily.  divalproex 125 MG capsule Commonly known as:  DEPAKOTE SPRINKLE Take 375-750 mg by mouth 2 (two) times daily. 375mg  in the morning and 750mg  at night  docusate sodium 100 MG capsule Commonly known as:  COLACE Take 200 mg by mouth daily.  ferrous sulfate 325 (65 FE) MG tablet Take 325 mg by mouth daily.  hydrochlorothiazide 25 MG tablet Commonly known as:  HYDRODIURIL Take 25 mg by mouth daily.  lithium carbonate 300 MG capsule Take 1 capsule (300 mg total) by mouth at bedtime. What changed:  how much to take  LORazepam 1 MG tablet Commonly known as:  ATIVAN Take 1 mg by mouth 2 (two) times daily.  LORazepam 0.5 MG  tablet Commonly known as:  ATIVAN Take 0.5 mg by mouth every 8 (eight) hours as needed for anxiety.  meclizine 25 MG tablet Commonly known as:  ANTIVERT Take 25 mg by mouth 3 (three) times daily as needed for dizziness.  medroxyPROGESTERone 150 MG/ML injection Commonly  known as:  DEPO-PROVERA Inject 150 mg into the muscle every 3 (three) months.  naltrexone 50 MG tablet Commonly known as:  DEPADE Take 50 mg by mouth daily.  omeprazole 20 MG capsule Commonly known as:  PRILOSEC Take 20 mg by mouth daily.  PARoxetine 20 MG tablet Commonly known as:  PAXIL Take 20 mg by mouth daily.  PATADAY 0.2 % Soln Generic drug:  Olopatadine HCl Apply 1 drop to eye daily as needed (for allergies).  polyethylene glycol packet Commonly known as:  MIRALAX / GLYCOLAX Take 17 g by mouth every other day.  QUEtiapine 50 MG tablet Commonly known as:  SEROQUEL Take 1 tablet (50 mg total) by mouth every 6 (six) hours as needed. What changed:  when to take this  reasons to take this  SAPHRIS 10 MG Subl Generic drug:  Asenapine Maleate Place 1 tablet under the tongue 3 (three) times daily.  solifenacin 10 MG tablet Commonly known as:  VESICARE Take 10 mg by mouth daily.  valACYclovir 1000 MG tablet Commonly known as:  VALTREX Take 2,000 mg by mouth 2 (two) times daily as needed (for fever blisters).     Relevant Imaging Results:  Relevant Lab Results:   Additional Information SSN:  [id number]  Raye SorrowCoble, Katja Blue N, [place]

## 2016-05-24 NOTE — Progress Notes (Signed)
[facility]. Discussed treatment recommendations and changes. Updated FL2 as well with list of medications on FL2.  Will fax to facility [phone]. Facility will be called once DC summary is available and will come to pick patient up.  Guardian Minerva Areolaric made aware of discharge today and [facility].  Deretha EmoryHannah Tracy Gerken LCSW, MSW Clinical Social Work: Optician, dispensingystem Wide [employer] Coverage for :  [phone]

## 2016-05-25 ENCOUNTER — Encounter: Payer: Self-pay | Admitting: Emergency Medicine

## 2016-05-25 ENCOUNTER — Emergency Department
Admission: EM | Admit: 2016-05-25 | Discharge: 2016-05-26 | Disposition: A | Discharge status: Home / Self Care | Payer: Medicaid Other | Attending: Emergency Medicine | Admitting: Emergency Medicine

## 2016-05-25 DIAGNOSIS — R4689 Other symptoms and signs involving appearance and behavior: Secondary | ICD-10-CM | POA: Diagnosis not present

## 2016-05-25 LAB — CBC WITH DIFFERENTIAL/PLATELET
Basophils Absolute: 0.1 10*3/uL (ref 0–0.1)
Basophils Relative: 1 %
EOS ABS: 0.2 10*3/uL (ref 0–0.7)
Eosinophils Relative: 2 %
HEMATOCRIT: 36 % (ref 35.0–47.0)
HEMOGLOBIN: 11.9 g/dL — AB (ref 12.0–16.0)
LYMPHS ABS: 2.5 10*3/uL (ref 1.0–3.6)
LYMPHS PCT: 24 %
MCH: 31.2 pg (ref 26.0–34.0)
MCHC: 33.2 g/dL (ref 32.0–36.0)
MCV: 94.1 fL (ref 80.0–100.0)
Monocytes Absolute: 0.9 10*3/uL (ref 0.2–0.9)
Monocytes Relative: 8 %
NEUTROS ABS: 7 10*3/uL — AB (ref 1.4–6.5)
NEUTROS PCT: 65 %
Platelets: 193 10*3/uL (ref 150–440)
RBC: 3.82 MIL/uL (ref 3.80–5.20)
RDW: 12.7 % (ref 11.5–14.5)
WBC: 10.6 10*3/uL (ref 3.6–11.0)

## 2016-05-25 LAB — COMPREHENSIVE METABOLIC PANEL
ALK PHOS: 43 U/L (ref 38–126)
ALT: 10 U/L — AB (ref 14–54)
AST: 26 U/L (ref 15–41)
Albumin: 3.4 g/dL — ABNORMAL LOW (ref 3.5–5.0)
Anion gap: 7 (ref 5–15)
BUN: 16 mg/dL (ref 6–20)
CALCIUM: 8.8 mg/dL — AB (ref 8.9–10.3)
CO2: 22 mmol/L (ref 22–32)
CREATININE: 1.16 mg/dL — AB (ref 0.44–1.00)
Chloride: 111 mmol/L (ref 101–111)
GFR calc non Af Amer: >60 mL/min (ref >60)
GLUCOSE: 106 mg/dL — AB (ref 65–99)
Potassium: 3.6 mmol/L (ref 3.5–5.1)
SODIUM: 140 mmol/L (ref 135–145)
Total Bilirubin: 0.3 mg/dL (ref 0.3–1.2)
Total Protein: 6.4 g/dL — ABNORMAL LOW (ref 6.5–8.1)

## 2016-05-25 MED ORDER — HALOPERIDOL LACTATE 5 MG/ML IJ SOLN
5.0000 mg | Freq: Once | INTRAMUSCULAR | Status: AC
  Administered 2016-05-25: 5 mg via INTRAMUSCULAR

## 2016-05-25 MED ORDER — DIAZEPAM 5 MG PO TABS
ORAL_TABLET | ORAL | Status: AC
  Administered 2016-05-25: 5 mg via ORAL
  Filled 2016-05-25: qty 1

## 2016-05-25 MED ORDER — HALOPERIDOL LACTATE 5 MG/ML IJ SOLN
INTRAMUSCULAR | Status: AC
  Filled 2016-05-25: qty 1

## 2016-05-25 MED ORDER — DIAZEPAM 5 MG PO TABS
5.0000 mg | ORAL_TABLET | Freq: Once | ORAL | Status: AC
  Administered 2016-05-25: 5 mg via ORAL

## 2016-05-25 MED ORDER — DIPHENHYDRAMINE HCL 50 MG/ML IJ SOLN
INTRAMUSCULAR | Status: AC
  Filled 2016-05-25: qty 1

## 2016-05-25 MED ORDER — DIPHENHYDRAMINE HCL 50 MG/ML IJ SOLN
50.0000 mg | Freq: Once | INTRAMUSCULAR | Status: AC
  Administered 2016-05-25: 50 mg via INTRAMUSCULAR

## 2016-05-25 MED ORDER — HALOPERIDOL LACTATE 5 MG/ML IJ SOLN
INTRAMUSCULAR | Status: AC
  Administered 2016-05-25: 5 mg via INTRAMUSCULAR
  Filled 2016-05-25: qty 1

## 2016-05-25 MED ORDER — MIDAZOLAM HCL 2 MG/2ML IJ SOLN
2.0000 mg | Freq: Once | INTRAMUSCULAR | Status: AC
  Administered 2016-05-25: 2 mg via INTRAMUSCULAR

## 2016-05-25 MED ORDER — MIDAZOLAM HCL 2 MG/2ML IJ SOLN
INTRAMUSCULAR | Status: AC
  Administered 2016-05-25: 2 mg
  Filled 2016-05-25: qty 2
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
## 2016-05-25 NOTE — ED Triage Notes (Addendum)
Pt to ed with police from group home, after staff there reported pt was aggressive and violent towards other patients in the home.  Pt alert at triage and with calm behavior.

## 2016-05-25 NOTE — ED Notes (Signed)
She is awake and ambulatory to the BR - I have assisted her into changing back into scrubs  Pt apologetic "I am sorry - I did not mean to hurt anybody."  Pt reassured

## 2016-05-25 NOTE — ED Notes (Addendum)
Pt with orders to be discharged back to her group home  - I have called the phone number in the computer but got no answer  TTS informed

## 2016-05-25 NOTE — ED Notes (Signed)

## 2016-05-25 NOTE — ED Notes (Signed)
BEHAVIORAL HEALTH ROUNDING Patient sleeping: No. Patient alert and oriented: yes Behavior appropriate: Yes.  ; If no, describe:  Nutrition and fluids offered: yes Toileting and hygiene offered: Yes  Sitter present: q15 minute observations and security  monitoring Law enforcement present: Yes  ODS  

## 2016-05-25 NOTE — Progress Notes (Signed)
Unable to complete or begin TTS assessment. Patient was agressive and had to be restrained/ given medication management. Krishana Lutze K. Sherlon HandingHarris, LCAS-A, LPC-A, [facility]  Counselor 05/25/2016 5:07 PM

## 2016-05-25 NOTE — ED Notes (Signed)
Meal tray given to the patient. Patient eating without any issues.

## 2016-05-25 NOTE — ED Notes (Signed)
I have not seen Ms Hannah Keller return to pick up her group home member  I called back and left a message at this time

## 2016-05-25 NOTE — ED Notes (Signed)
Patient in bathroom

## 2016-05-25 NOTE — ED Notes (Addendum)
I went to the lobby and spoke with Juleen ChinaVivian Timmons concerning Ms Marletta LorCarver and her discharge disposition  Ms Burnadette Popimmons stated that she will go to the group home and get the Zenaida Niecevan to come back and pick the pt up  I questioned why the pt cannot leave with her at this time and Ms Burnadette Popimmons stated  "She ain't getting in my car. I will be back in ten minutes to get her - my group home is not far from here and I am the owner."

## 2016-05-25 NOTE — ED Notes (Addendum)
I have called back and spoken with Ms Hannah Keller concerning transportation back to the group home   Ms Hannah Keller was quite short and rude to me on the phone stating  "I am on my way - I am taking my time but I am on my way - I always do what I say I am going to do."  We must continue to await her arrival  -EDP and  BPD aware that caregiver has not arrived to transport her home

## 2016-05-25 NOTE — ED Notes (Signed)
LUNCH TRAY GIVEN TO PATIENT.

## 2016-05-26 MED ORDER — DIVALPROEX SODIUM 125 MG PO CSDR
375.0000 mg | DELAYED_RELEASE_CAPSULE | Freq: Every morning | ORAL | Status: DC
  Administered 2016-05-26: 125 mg via ORAL
  Filled 2016-05-26: qty 3

## 2016-05-26 MED ORDER — NALTREXONE HCL 50 MG PO TABS
50.0000 mg | ORAL_TABLET | Freq: Every day | ORAL | Status: DC
  Administered 2016-05-26: 50 mg via ORAL
  Filled 2016-05-26: qty 1

## 2016-05-26 MED ORDER — QUETIAPINE FUMARATE 25 MG PO TABS
50.0000 mg | ORAL_TABLET | Freq: Four times a day (QID) | ORAL | Status: DC | PRN
  Administered 2016-05-26: 50 mg via ORAL
  Filled 2016-05-26: qty 2

## 2016-05-26 MED ORDER — DIVALPROEX SODIUM 125 MG PO CSDR: 750.0000 mg | DELAYED_RELEASE_CAPSULE | Freq: Every day | ORAL | Status: DC

## 2016-05-26 MED ORDER — ACETAMINOPHEN 325 MG PO TABS: 325.0000 mg | ORAL_TABLET | Freq: Four times a day (QID) | ORAL | Status: DC | PRN

## 2016-05-26 MED ORDER — MECLIZINE HCL 25 MG PO TABS: 25.0000 mg | ORAL_TABLET | Freq: Three times a day (TID) | ORAL | Status: DC | PRN

## 2016-05-26 MED ORDER — BENZTROPINE MESYLATE 0.5 MG PO TABS
1.0000 mg | ORAL_TABLET | Freq: Two times a day (BID) | ORAL | Status: DC
  Administered 2016-05-26: 1 mg via ORAL
  Filled 2016-05-26: qty 2

## 2016-05-26 MED ORDER — HYDROCHLOROTHIAZIDE 25 MG PO TABS
25.0000 mg | ORAL_TABLET | Freq: Every day | ORAL | Status: DC
  Administered 2016-05-26: 25 mg via ORAL
  Filled 2016-05-26: qty 1

## 2016-05-26 MED ORDER — LORAZEPAM 0.5 MG PO TABS: 0.5000 mg | ORAL_TABLET | Freq: Three times a day (TID) | ORAL | Status: DC | PRN

## 2016-05-26 MED ORDER — CLONIDINE HCL 0.1 MG/24HR TD PTWK
0.3000 mg | MEDICATED_PATCH | TRANSDERMAL | Status: DC
  Administered 2016-05-26: 0.3 mg via TRANSDERMAL
  Filled 2016-05-26 (×2): qty 3

## 2016-05-26 MED ORDER — DIPHENHYDRAMINE HCL 25 MG PO CAPS
50.0000 mg | ORAL_CAPSULE | Freq: Every day | ORAL | Status: DC
  Administered 2016-05-26: 50 mg via ORAL
  Filled 2016-05-26: qty 2

## 2016-05-26 MED ORDER — ASENAPINE MALEATE 5 MG SL SUBL
10.0000 mg | SUBLINGUAL_TABLET | Freq: Three times a day (TID) | SUBLINGUAL | Status: DC
  Administered 2016-05-26 (×2): 10 mg via SUBLINGUAL
  Filled 2016-05-26 (×2): qty 2

## 2016-05-26 MED ORDER — BUPROPION HCL ER (XL) 150 MG PO TB24
150.0000 mg | ORAL_TABLET | Freq: Every day | ORAL | Status: DC
  Administered 2016-05-26: 150 mg via ORAL
  Filled 2016-05-26: qty 1

## 2016-05-26 MED ORDER — VITAMIN D 1000 UNITS PO TABS
1000.0000 [IU] | ORAL_TABLET | Freq: Two times a day (BID) | ORAL | Status: DC
  Administered 2016-05-26: 1000 [IU] via ORAL
  Filled 2016-05-26: qty 1

## 2016-05-26 MED ORDER — ARIPIPRAZOLE 5 MG PO TABS
15.0000 mg | ORAL_TABLET | Freq: Every day | ORAL | Status: DC
  Administered 2016-05-26: 15 mg via ORAL
  Filled 2016-05-26: qty 3

## 2016-05-26 MED ORDER — PAROXETINE HCL 20 MG PO TABS
20.0000 mg | ORAL_TABLET | Freq: Every day | ORAL | Status: DC
  Administered 2016-05-26: 20 mg via ORAL
  Filled 2016-05-26: qty 1

## 2016-05-26 MED ORDER — LORAZEPAM 1 MG PO TABS
1.0000 mg | ORAL_TABLET | Freq: Two times a day (BID) | ORAL | Status: DC
  Administered 2016-05-26: 1 mg via ORAL
  Filled 2016-05-26: qty 1

## 2016-05-26 MED ORDER — FERROUS SULFATE 325 (65 FE) MG PO TABS
325.0000 mg | ORAL_TABLET | Freq: Every day | ORAL | Status: DC
  Administered 2016-05-26: 325 mg via ORAL
  Filled 2016-05-26: qty 1

## 2016-05-26 MED ORDER — DOCUSATE SODIUM 100 MG PO CAPS
200.0000 mg | ORAL_CAPSULE | Freq: Every day | ORAL | Status: DC
  Administered 2016-05-26: 200 mg via ORAL
  Filled 2016-05-26: qty 2

## 2016-05-26 MED ORDER — CLONIDINE HCL 0.1 MG/24HR TD PTWK
MEDICATED_PATCH | TRANSDERMAL | Status: AC
  Filled 2016-05-26: qty 1

## 2016-05-26 NOTE — BH Assessment (Signed)
Writer spoke with Psychiatrist (Dr. Toni Amendlapacs) about the patient's disposition. He stated, it is the same as it was on yesterday. She can be discharged back to Group Home.  Writer spoke with ER MD (Dr. Cyril LoosenKinner) and gave him.  Dr. Toni Amendlapacs rescind the IVC.

## 2016-05-26 NOTE — Consult Note (Signed)
Psychiatry: Patient is a 25 year old woman with moderate developmental disability and a history of behavior problems. She had been tended for discharge yesterday but apparently lost her temper and acted out and was never released from the emergency room and the commitment was reinstated. Patient has not had any further behavior problems all day. Not aggressive or agitated or out of control. Not reporting suicidal or homicidal ideation. This is a woman with moderate MR very limited in cognition very limited in speech.  Patient not likely to benefit from psychiatric hospitalization. Not meeting criteria for hospitalization here. Does not appear to be acutely dangerous. I have discontinue the involuntary commitment and recommend that she can be released back to her group home.

## 2016-05-26 NOTE — ED Notes (Signed)

## 2016-05-26 NOTE — ED Notes (Signed)
Breakfast given to patient.

## 2016-05-26 NOTE — ED Notes (Signed)
BEHAVIORAL HEALTH ROUNDING Patient sleeping: No. Patient alert and oriented: yes Behavior appropriate: Yes.  ; If no, describe:  Nutrition and fluids offered: yes Toileting and hygiene offered: Yes  Sitter present: q15 minute observations and security  monitoring Law enforcement present: Yes  ODS  

## 2016-05-26 NOTE — ED Provider Notes (Signed)
Patient cleared for discharge by Dr. Luz Lexlapacs   Panzy Bubeck, MD 05/26/16 [phone]

## 2016-05-26 NOTE — ED Provider Notes (Signed)
-----------------------------------------   8:45 AM on 05/26/2016 -----------------------------------------   Blood pressure 111/65, pulse 95, temperature 98.1 F (36.7 C), temperature source Oral, resp. rate 18, weight 160 lb (72.6 kg), SpO2 99 %.  The patient had no acute events since last update.  Calm and cooperative at this time.  Disposition is pending Psychiatry/Behavioral Medicine team recommendations.     Willy EddyPatrick Sandra Brents, MD 05/26/16 [phone]

## 2016-05-26 NOTE — ED Notes (Signed)
[phone]

## 2016-05-26 NOTE — ED Notes (Signed)
Called to initiate SOC consult [phone] 

## 2016-05-26 NOTE — ED Notes (Signed)
Spoke with Hannah Keller at the group home.  Hannah Keller reports she will try to find someone to come with her and the other residents so [facility]

## 2016-05-26 NOTE — ED Notes (Signed)

## 2016-05-26 NOTE — ED Notes (Signed)
Patient in shower 

## 2016-05-26 NOTE — ED Notes (Signed)
ED BHU PLACEMENT JUSTIFICATION Is the patient under IVC or is there intent for IVC: Yes.   Is the patient medically cleared: Yes.   Is there vacancy in the ED BHU: Yes.   Is the population mix appropriate for patient: Yes.   Is the patient awaiting placement in inpatient or outpatient setting: Yes - ?inpt admission vs return to group home Has the patient had a psychiatric consult: Yes.   Survey of unit performed for contraband, proper placement and condition of furniture, tampering with fixtures in bathroom, shower, and each patient room: Yes.  ; Findings:  APPEARANCE/BEHAVIOR Calm and cooperative NEURO ASSESSMENT Orientation: oriented x4  Denies pain Hallucinations: No.None noted (Hallucinations) Speech: Normal  Slow to respond Gait: normal RESPIRATORY ASSESSMENT Even  Unlabored respirations  CARDIOVASCULAR ASSESSMENT Pulses equal   regular rate  Skin warm and dry   GASTROINTESTINAL ASSESSMENT no GI complaint EXTREMITIES Full ROM  PLAN OF CARE Provide calm/safe environment. Vital signs assessed twice daily. ED BHU Assessment once each 12-hour shift. Collaborate with TTS daily or as condition indicates. Assure the ED provider has rounded once each shift. Provide and encourage hygiene. Provide redirection as needed. Assess for escalating behavior; address immediately and inform ED provider.  Assess family dynamic and appropriateness for visitation as needed: Yes.  ; If necessary, describe findings:  Educate the patient/family about BHU procedures/visitation: Yes.  ; If necessary, describe findings:

## 2016-05-31 ENCOUNTER — Emergency Department
Admission: EM | Admit: 2016-05-31 | Discharge: 2016-06-05 | Disposition: A | Discharge status: Home / Self Care | Payer: Medicaid Other | Attending: Emergency Medicine | Admitting: Emergency Medicine

## 2016-05-31 ENCOUNTER — Encounter: Payer: Self-pay | Admitting: Emergency Medicine

## 2016-05-31 DIAGNOSIS — M79601 Pain in right arm: Secondary | ICD-10-CM | POA: Diagnosis not present

## 2016-05-31 DIAGNOSIS — H1032 Unspecified acute conjunctivitis, left eye: Secondary | ICD-10-CM | POA: Diagnosis not present

## 2016-05-31 DIAGNOSIS — F71 Moderate intellectual disabilities: Secondary | ICD-10-CM | POA: Diagnosis not present

## 2016-05-31 DIAGNOSIS — R569 Unspecified convulsions: Secondary | ICD-10-CM

## 2016-05-31 DIAGNOSIS — F918 Other conduct disorders: Secondary | ICD-10-CM | POA: Diagnosis present

## 2016-05-31 DIAGNOSIS — R4689 Other symptoms and signs involving appearance and behavior: Secondary | ICD-10-CM

## 2016-05-31 DIAGNOSIS — F200 Paranoid schizophrenia: Secondary | ICD-10-CM

## 2016-05-31 DIAGNOSIS — Z79899 Other long term (current) drug therapy: Secondary | ICD-10-CM | POA: Diagnosis not present

## 2016-05-31 DIAGNOSIS — R456 Violent behavior: Secondary | ICD-10-CM | POA: Diagnosis not present

## 2016-05-31 LAB — CBC WITH DIFFERENTIAL/PLATELET
BASOS PCT: 1 %
Basophils Absolute: 0 10*3/uL (ref 0–0.1)
EOS ABS: 0.3 10*3/uL (ref 0–0.7)
EOS PCT: 3 %
HCT: 37.7 % (ref 35.0–47.0)
Hemoglobin: 12.6 g/dL (ref 12.0–16.0)
Lymphocytes Relative: 24 %
Lymphs Abs: 2.2 10*3/uL (ref 1.0–3.6)
MCH: 31 pg (ref 26.0–34.0)
MCHC: 33.4 g/dL (ref 32.0–36.0)
MCV: 92.7 fL (ref 80.0–100.0)
MONO ABS: 0.4 10*3/uL (ref 0.2–0.9)
MONOS PCT: 4 %
Neutro Abs: 6.3 10*3/uL (ref 1.4–6.5)
Neutrophils Relative %: 68 %
PLATELETS: 221 10*3/uL (ref 150–440)
RBC: 4.06 MIL/uL (ref 3.80–5.20)
RDW: 12.6 % (ref 11.5–14.5)
WBC: 9.3 10*3/uL (ref 3.6–11.0)

## 2016-05-31 LAB — URINALYSIS COMPLETE WITH MICROSCOPIC (ARMC ONLY)
Bacteria, UA: NONE SEEN
Bilirubin Urine: NEGATIVE
Glucose, UA: NEGATIVE mg/dL
Hgb urine dipstick: NEGATIVE
KETONES UR: NEGATIVE mg/dL
Leukocytes, UA: NEGATIVE
Nitrite: NEGATIVE
PROTEIN: NEGATIVE mg/dL
Specific Gravity, Urine: 1.019 (ref 1.005–1.030)
pH: 6 (ref 5.0–8.0)

## 2016-05-31 LAB — BASIC METABOLIC PANEL
Anion gap: 7 (ref 5–15)
BUN: 18 mg/dL (ref 6–20)
CALCIUM: 9.5 mg/dL (ref 8.9–10.3)
CO2: 25 mmol/L (ref 22–32)
CREATININE: 1.39 mg/dL — AB (ref 0.44–1.00)
Chloride: 108 mmol/L (ref 101–111)
GFR, EST NON AFRICAN AMERICAN: 52 mL/min — AB (ref >60)
Glucose, Bld: 153 mg/dL — ABNORMAL HIGH (ref 65–99)
Potassium: 3.4 mmol/L — ABNORMAL LOW (ref 3.5–5.1)
SODIUM: 140 mmol/L (ref 135–145)

## 2016-05-31 LAB — URINE DRUG SCREEN, QUALITATIVE (ARMC ONLY)
Amphetamines, Ur Screen: NOT DETECTED
Barbiturates, Ur Screen: NOT DETECTED
Benzodiazepine, Ur Scrn: POSITIVE — AB
CANNABINOID 50 NG, UR ~~LOC~~: NOT DETECTED
Cocaine Metabolite,Ur ~~LOC~~: NOT DETECTED
MDMA (ECSTASY) UR SCREEN: NOT DETECTED
Methadone Scn, Ur: NOT DETECTED
Opiate, Ur Screen: NOT DETECTED
PHENCYCLIDINE (PCP) UR S: NOT DETECTED
Tricyclic, Ur Screen: POSITIVE — AB

## 2016-05-31 LAB — ACETAMINOPHEN LEVEL

## 2016-05-31 LAB — SALICYLATE LEVEL

## 2016-05-31 LAB — POCT PREGNANCY, URINE: PREG TEST UR: NEGATIVE

## 2016-05-31 MED ORDER — ZIPRASIDONE MESYLATE 20 MG IM SOLR
10.0000 mg | Freq: Once | INTRAMUSCULAR | Status: AC
  Administered 2016-05-31: 10 mg via INTRAMUSCULAR
  Filled 2016-05-31: qty 20

## 2016-05-31 MED ORDER — DARIFENACIN HYDROBROMIDE ER 7.5 MG PO TB24
7.5000 mg | ORAL_TABLET | Freq: Every day | ORAL | Status: DC
  Administered 2016-05-31 – 2016-06-04 (×5): 7.5 mg via ORAL
  Filled 2016-05-31 (×6): qty 1

## 2016-05-31 MED ORDER — CLONIDINE HCL 0.1 MG/24HR TD PTWK
0.3000 mg | MEDICATED_PATCH | TRANSDERMAL | Status: DC
  Administered 2016-05-31: 0.3 mg via TRANSDERMAL
  Filled 2016-05-31 (×2): qty 3

## 2016-05-31 MED ORDER — LITHIUM CARBONATE 300 MG PO CAPS
300.0000 mg | ORAL_CAPSULE | Freq: Two times a day (BID) | ORAL | Status: DC
  Administered 2016-05-31 – 2016-06-05 (×10): 300 mg via ORAL
  Filled 2016-05-31 (×12): qty 1

## 2016-05-31 MED ORDER — QUETIAPINE FUMARATE 200 MG PO TABS
200.0000 mg | ORAL_TABLET | Freq: Three times a day (TID) | ORAL | Status: DC
  Administered 2016-05-31 – 2016-06-04 (×14): 200 mg via ORAL
  Filled 2016-05-31 (×14): qty 1

## 2016-05-31 MED ORDER — ARIPIPRAZOLE 5 MG PO TABS
15.0000 mg | ORAL_TABLET | Freq: Every day | ORAL | Status: DC
  Administered 2016-06-01 – 2016-06-04 (×4): 15 mg via ORAL
  Filled 2016-05-31 (×4): qty 3

## 2016-05-31 MED ORDER — DIVALPROEX SODIUM 250 MG PO DR TAB
750.0000 mg | DELAYED_RELEASE_TABLET | Freq: Two times a day (BID) | ORAL | Status: DC
  Administered 2016-05-31 – 2016-06-04 (×9): 750 mg via ORAL
  Filled 2016-05-31 (×9): qty 1

## 2016-05-31 MED ORDER — ERYTHROMYCIN 5 MG/GM OP OINT
TOPICAL_OINTMENT | Freq: Four times a day (QID) | OPHTHALMIC | Status: DC
  Administered 2016-05-31 – 2016-06-02 (×7): 1 via OPHTHALMIC
  Administered 2016-06-02: 14:00:00 via OPHTHALMIC
  Administered 2016-06-02: 1 via OPHTHALMIC
  Administered 2016-06-03 (×3): via OPHTHALMIC
  Administered 2016-06-03: 1 via OPHTHALMIC
  Administered 2016-06-03 – 2016-06-04 (×2): via OPHTHALMIC
  Administered 2016-06-04 (×3): 1 via OPHTHALMIC
  Administered 2016-06-05: 07:00:00 via OPHTHALMIC
  Filled 2016-05-31 (×6): qty 1

## 2016-05-31 MED ORDER — DOCUSATE SODIUM 100 MG PO CAPS
100.0000 mg | ORAL_CAPSULE | Freq: Two times a day (BID) | ORAL | Status: DC
  Administered 2016-05-31 – 2016-06-04 (×9): 100 mg via ORAL
  Filled 2016-05-31 (×9): qty 1

## 2016-05-31 MED ORDER — ASENAPINE MALEATE 5 MG SL SUBL
10.0000 mg | SUBLINGUAL_TABLET | Freq: Two times a day (BID) | SUBLINGUAL | Status: DC
  Administered 2016-05-31 – 2016-06-04 (×9): 10 mg via SUBLINGUAL
  Filled 2016-05-31 (×9): qty 2

## 2016-05-31 MED ORDER — CLONAZEPAM 0.5 MG PO TABS
1.0000 mg | ORAL_TABLET | Freq: Three times a day (TID) | ORAL | Status: DC
  Administered 2016-05-31 – 2016-06-04 (×14): 1 mg via ORAL
  Filled 2016-05-31 (×14): qty 2

## 2016-05-31 MED ORDER — BENZTROPINE MESYLATE 0.5 MG PO TABS
1.0000 mg | ORAL_TABLET | Freq: Two times a day (BID) | ORAL | Status: DC
  Administered 2016-05-31 – 2016-06-04 (×9): 1 mg via ORAL
  Filled 2016-05-31 (×9): qty 2

## 2016-05-31 MED ORDER — ARIPIPRAZOLE 5 MG PO TABS
15.0000 mg | ORAL_TABLET | Freq: Once | ORAL | Status: AC
  Administered 2016-05-31: 15 mg via ORAL
  Filled 2016-05-31: qty 3

## 2016-05-31 NOTE — ED Notes (Signed)
Pt given warm blanket and is walking around room.

## 2016-05-31 NOTE — ED Notes (Signed)
Pt given ginger ale.

## 2016-05-31 NOTE — ED Notes (Signed)
Pt given breakfast tray
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
## 2016-05-31 NOTE — BH Assessment (Signed)
Writer called and left a HIPPA Compliant message with Group Home staff (Vivain [phone]), requesting a return phone call.

## 2016-05-31 NOTE — BH Assessment (Signed)
Group Home (Vivan Tillman-[phone]) faxed a copy of Guardianship Paperwork. A copy placed on patient's paper chart.

## 2016-05-31 NOTE — ED Notes (Signed)
Pt given meal tray and drink. Eating and sitting down listening to music.
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
## 2016-05-31 NOTE — ED Notes (Signed)
Patient appear more calm and cooperative. ED tech in the room at this time.
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
## 2016-05-31 NOTE — ED Notes (Signed)
Pt again standing in doorway trying to walk around. Again instructed pt to remain in room. Dr Don Perkingveronese at bedside to speak with pt. New orders received

## 2016-05-31 NOTE — ED Notes (Signed)
Vital signs deferred at this time due to patient's agitation.
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
## 2016-05-31 NOTE — ED Notes (Addendum)
Patient is sitting in an upright position on the stretcher with rails up. Patient appears to be sleeping at this time. Patient aroused easily, but was calm at this time.ED tech in the hallway performing every 15 minute checks.

## 2016-06-01 LAB — VALPROIC ACID LEVEL: VALPROIC ACID LVL: 66 ug/mL (ref 50.0–100.0)

## 2016-06-01 LAB — LITHIUM LEVEL: LITHIUM LVL: 0.39 mmol/L — AB (ref 0.60–1.20)

## 2016-06-01 NOTE — ED Notes (Signed)
BEHAVIORAL HEALTH ROUNDING Patient sleeping: No. Patient alert and oriented: yes Behavior appropriate: Yes.  ; If no, describe:  Nutrition and fluids offered: yes Toileting and hygiene offered: Yes  Sitter present: q15 minute observations and security  monitoring Law enforcement present: Yes  ODS  

## 2016-06-01 NOTE — BH Assessment (Signed)
Received phone call from [facility] [employer] ([phone]), requesting additional information for the [facility] Referral & Diversion Paperwork.  Spoke with [facility] ([phone] ext. [phone]) and patient does not receive services from them. Writer completed verbal screening/referral, via phone. Requested documentation faxed to [facility], to add to the patient's "referral chart." Patient placed on their waitlist, for consideration/eligibility for their services. Advised to inform patient's MCO ([facility]) to call [facility] and make a referral to complete the entire referral process.   Confirmed with [facility] (Julie-[phone] ext. [phone]) the information was received. She also states, she had already called and spoke with the patient's guardian.  Followed up with [facility] ([phone]) in regards to their part with providing referral information to [facility]. [facility] explained, [facility], it will automatically be addressed and taking care of. It's part of their "standard procedures and check and balances" with the MR/IDD population.  [facility] ([phone]), patient is declined due to being "too acute for the unit."  Received phone call from [facility] ([phone]) that they received the information requested. Provided Clinical research associatewriter with Tracking/Authorazation Number [id number]).  Verbal screening completed with [facility]([phone]), information faxed and confirmed ([phone]) it was received. Currently pending review.

## 2016-06-01 NOTE — ED Provider Notes (Signed)
-----------------------------------------   7:34 AM on 06/01/2016 -----------------------------------------   Blood pressure 113/61, pulse 72, temperature 98.1 F (36.7 C), temperature source Oral, resp. rate 18, height 5\' 2"  (1.575 m), weight 160 lb (72.6 kg), last menstrual period 05/28/2016, SpO2 98 %.  The patient had no acute events since last update.  Calm and cooperative at this time.  Disposition is pending Psychiatry/Behavioral Medicine team recommendations.     Sharman CheekPhillip Prabhjot Maddux, MD 06/01/16 [phone]

## 2016-06-01 NOTE — ED Notes (Signed)
IVC/Consult completed/ Pending placement 

## 2016-06-01 NOTE — ED Notes (Signed)
She is awake and ambulating in her room  NAD observed

## 2016-06-01 NOTE — ED Notes (Signed)
Assisted patient with the preparation of her evening meal (opening packets and putting ketchup, mayo and mustard on her burger; opening her drink, etc.)

## 2016-06-01 NOTE — BH Assessment (Signed)
Requested information (Lithium & Depakote levels) faxed to [facility] ([phone]) and confirmed they were received.  Patient placed on [facility] Wait List Hannah Keller(Connie).

## 2016-06-01 NOTE — ED Notes (Signed)
ED BHU PLACEMENT JUSTIFICATION Is the patient under IVC or is there intent for IVC: Yes.   Is the patient medically cleared: Yes.   Is there vacancy in the ED BHU: Yes.   Is the population mix appropriate for patient: Yes.   Is the patient awaiting placement in inpatient or outpatient setting: Yes.  Group home placement    Has the patient had a psychiatric consult: Yes.   Survey of unit performed for contraband, proper placement and condition of furniture, tampering with fixtures in bathroom, shower, and each patient room: Yes.  ; Findings:  APPEARANCE/BEHAVIOR Calm and cooperative NEURO ASSESSMENT Orientation: oriented x2  Denies pain Hallucinations: No.None noted (Hallucinations) Speech: Normal - garbled at times  Gait: normal RESPIRATORY ASSESSMENT Even  Unlabored respirations  CARDIOVASCULAR ASSESSMENT Pulses equal   regular rate  Skin warm and dry   GASTROINTESTINAL ASSESSMENT no GI complaint EXTREMITIES Full ROM  PLAN OF CARE Provide calm/safe environment. Vital signs assessed twice daily. ED BHU Assessment once each 12-hour shift. Collaborate with TTS daily or as condition indicates. Assure the ED provider has rounded once each shift. Provide and encourage hygiene. Provide redirection as needed. Assess for escalating behavior; address immediately and inform ED provider.  Assess family dynamic and appropriateness for visitation as needed: Yes.  ; If necessary, describe findings:  Educate the patient/family about BHU procedures/visitation: Yes.  ; If necessary, describe findings:
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
## 2016-06-01 NOTE — ED Notes (Signed)
BEHAVIORAL HEALTH ROUNDING Patient sleeping: No. Patient alert and oriented: yes oriented to self, place Behavior appropriate: Yes.  ; If no, describe:  Nutrition and fluids offered: yes Toileting and hygiene offered: Yes  Sitter present: q15 minute observations and security monitoring Law enforcement present: Yes  ODS  She has been sitting in her room - writing and coloring  NAD assessed

## 2016-06-01 NOTE — ED Notes (Signed)
Patient stated that she was still hungry. Provided her with peanut butter on graham crackers and apple juice.

## 2016-06-01 NOTE — ED Notes (Signed)
BEHAVIORAL HEALTH ROUNDING Patient sleeping: No. Patient alert and oriented: yes  Oriented to self and place  Behavior appropriate: Yes.  ; If no, describe:  Nutrition and fluids offered: yes Toileting and hygiene offered: Yes  Sitter present: q15 minute observations and security monitoring Law enforcement present: Yes  ODS  ENVIRONMENTAL ASSESSMENT Potentially harmful objects out of patient reach: Yes.   Personal belongings secured: Yes.   Patient dressed in hospital provided attire only: Yes.   Plastic bags out of patient reach: Yes.   Patient care equipment (cords, cables, call bells, lines, and drains) shortened, removed, or accounted for: Yes.   Equipment and supplies removed from bottom of stretcher: Yes.   Potentially toxic materials out of patient reach: Yes.   Sharps container removed or out of patient reach: Yes.

## 2016-06-01 NOTE — BH Assessment (Signed)
Writer [facility] ([phone]) requesting Depakote & Lithium level. Writer informed ER MD (Dr. Derrill KayGoodman) and patient's nurse (Amy T.).

## 2016-06-01 NOTE — BHH Counselor (Signed)
Referral submitted to [employer] for authorization and approval for Diversion Exception.  Recevied call from [place] with [employer].  Informed that before the exception can be approved, patient will need to receive 4 denials from other hospitals.    Referral packet has been submitted to the following:  [facility] [phone]) - under review  [facility] [phone] - Only accepts Mild IDD  [facility] [phone]) - denied, patient IQ too low  [facility] [phone] - no beds vailable  Left message with [facility] [phone])

## 2016-06-02 NOTE — ED Provider Notes (Signed)
-----------------------------------------   4:47 AM on 06/02/2016 -----------------------------------------   Blood pressure (!) 108/58, pulse (!) 101, temperature 98.1 F (36.7 C), temperature source Oral, resp. rate 18, height 5\' 2"  (1.575 m), weight 160 lb (72.6 kg), last menstrual period 05/28/2016, SpO2 100 %.  The patient had no acute events since last update.  Calm and cooperative at this time.  Disposition is pending Psychiatry/Behavioral Medicine team recommendations.     Jennye MoccasinBrian S Quigley, MD 06/02/16 [phone]

## 2016-06-02 NOTE — ED Notes (Signed)
Pt. Noted in room. No complaints pt requesting paper and crayons. No distress or abnormal behavior noted. Will continue to monitor with Q 15 minute rounds.

## 2016-06-02 NOTE — ED Notes (Signed)
Patient in shower 

## 2016-06-02 NOTE — ED Notes (Signed)
Pt standing at entranceway of door, pt moving arms around in a dance like motion, no distress noted, cont to monitor

## 2016-06-02 NOTE — ED Notes (Addendum)
ED BHU PLACEMENT JUSTIFICATION Is the patient under IVC or is there intent for IVC: Yes.   Is the patient medically cleared: Yes.   Is there vacancy in the ED BHU: Yes.   Is the population mix appropriate for patient: No. Is the patient awaiting placement in inpatient or outpatient setting: Yes.   Has the patient had a psychiatric consult: Yes.   Survey of unit performed for contraband, proper placement and condition of furniture, tampering with fixtures in bathroom, shower, and each patient room: Yes.  ; Findings: nothing found APPEARANCE/BEHAVIOR calm, cooperative and adequate rapport can be established NEURO ASSESSMENT Orientation: place and person Hallucinations: No.None noted (Hallucinations) Speech: Normal garblied at times  Gait: shuffle RESPIRATORY ASSESSMENT Normal expansion.  Clear to auscultation.  No rales, rhonchi, or wheezing. CARDIOVASCULAR ASSESSMENT WNL GASTROINTESTINAL ASSESSMENT soft, nontender, BS WNL, no r/g EXTREMITIES normal strength, tone, and muscle mass PLAN OF CARE Provide calm/safe environment. Vital signs. Collaborate with intake RN daily or as condition indicates. Assure the ED provider has rounded once each shift. Provide and encourage hygiene. Provide redirection as needed. Assess for escalating behavior; address immediately and inform ED provider.  Assess family dynamic and appropriateness for visitation as needed: No.; If necessary, describe findings:  Educate the patient/family about BHU procedures/visitation: Yes.  ; If necessary, describe findings:   Pt is on ABT Eye ointment to left eye

## 2016-06-02 NOTE — Progress Notes (Signed)
LCSW called [facility] spoke to Denis- [facility]. No possible movement this weekend  Delta Air LinesClaudine Jashaun Penrose LCSW [phone]

## 2016-06-02 NOTE — ED Notes (Signed)
Patient sleeping placed food in room will give when she wake up

## 2016-06-03 NOTE — ED Notes (Signed)
Pt had this RN write thank you note to staff, pt danced and sang afterwards

## 2016-06-03 NOTE — ED Provider Notes (Signed)
-----------------------------------------   7:55 AM on 06/03/2016 -----------------------------------------   Blood pressure 111/65, pulse 74, temperature 98.1 F (36.7 C), temperature source Oral, resp. rate 18, height 5\' 2"  (1.575 m), weight 160 lb (72.6 kg), last menstrual period 05/28/2016, SpO2 99 %.  The patient had no acute events since last update.  Calm and cooperative at this time.  Disposition is pending Psychiatry/Behavioral Medicine team recommendations.     Willy EddyPatrick Tiarrah Saville, MD 06/03/16 [phone]

## 2016-06-03 NOTE — Progress Notes (Signed)
LCSW called [facility] and spoke to Ms Juleen ChinaVivian Timmons- [phone] [facility]- She was informed that while patient was awaiting [facility] wait list, medications were adjusted and there is a remarkable improvement and she appears managed on these medication now. [facility] is out of town and will pick up patient and take home on Tuesday.  Called Alberteen Spindleric Palmer Guardian and informed him patient will discharge on Tuesday  [phone] Delta Air LinesClaudine Shondrika Hoque LCSW  [phone]

## 2016-06-03 NOTE — ED Notes (Signed)
IVC/Consult completed/ Pending placement 

## 2016-06-03 NOTE — Progress Notes (Signed)
LCSW called [facility] and there is no movment with discharges today and unlikely for tomorrow.  [employer] Air LinesClaudine Oviya Ammar LCSW [phone]

## 2016-06-03 NOTE — ED Notes (Signed)
Pt urinated in bed, Hannah Keller, NT and this RN changed linen and cleaned pt, new clothes given

## 2016-06-04 NOTE — ED Notes (Signed)
Pt given graham crackers, peanut butter and a ginger ale. 

## 2016-06-04 NOTE — ED Notes (Signed)
Patient received breakfast tray 

## 2016-06-04 NOTE — ED Notes (Signed)
BEHAVIORAL HEALTH ROUNDING  Patient sleeping: No.  Patient alert and oriented: yes  Behavior appropriate: Yes. ; If no, describe:  Nutrition and fluids offered: Yes  Toileting and hygiene offered: Yes  Sitter present: not applicable, Q 15 min safety rounds and observation.  Law enforcement present: Yes ODS  

## 2016-06-04 NOTE — ED Notes (Signed)
Pt given coke by this tech

## 2016-06-04 NOTE — ED Notes (Signed)
BEHAVIORAL HEALTH ROUNDING Patient sleeping: Yes.   Patient alert and oriented: not applicable SLEEPING Behavior appropriate: Yes.  ; If no, describe: SLEEPING Nutrition and fluids offered: No SLEEPING Toileting and hygiene offered: NoSLEEPING Sitter present: not applicable, Q 15 min safety rounds and observation. Law enforcement present: Yes ODS 

## 2016-06-04 NOTE — ED Notes (Signed)
\  [phone]\

## 2016-06-04 NOTE — ED Notes (Signed)
ENVIRONMENTAL ASSESSMENT  Potentially harmful objects out of patient reach: Yes.  Personal belongings secured: Yes.  Patient dressed in hospital provided attire only: Yes.  Plastic bags out of patient reach: Yes.  Patient care equipment (cords, cables, call bells, lines, and drains) shortened, removed, or accounted for: Yes.  Equipment and supplies removed from bottom of stretcher: Yes.  Potentially toxic materials out of patient reach: Yes.  Sharps container removed or out of patient reach: Yes  BEHAVIORAL HEALTH ROUNDING  Patient sleeping: No.  Patient alert and oriented: yes  Behavior appropriate: Yes. ; If no, describe:  Nutrition and fluids offered: Yes  Toileting and hygiene offered: Yes  Sitter present: not applicable, Q 15 min safety rounds and observation.  Law enforcement present: Yes ODS  ED BHU PLACEMENT JUSTIFICATION  Is the patient under IVC or is there intent for IVC: Yes.  Is the patient medically cleared: Yes.  Is there vacancy in the ED BHU: Yes.  Is the population mix appropriate for patient: No. Pt does not meet unit criteria.   Is the patient awaiting placement in inpatient or outpatient setting: Yes.  Has the patient had a psychiatric consult: Yes.  Survey of unit performed for contraband, proper placement and condition of furniture, tampering with fixtures in bathroom, shower, and each patient room: Yes. ; Findings: All clear  APPEARANCE/BEHAVIOR  calm, cooperative and adequate rapport can be established  NEURO ASSESSMENT  Orientation: person and place at her baseline.   Hallucinations: No.None noted (Hallucinations)  Speech: Normal  Gait: normal  RESPIRATORY ASSESSMENT  WNL  CARDIOVASCULAR ASSESSMENT  WNL  GASTROINTESTINAL ASSESSMENT  WNL  EXTREMITIES  WNL  PLAN OF CARE  Provide calm/safe environment. Vital signs assessed TID. ED BHU Assessment once each 12-hour shift. Collaborate with TTS daily or as condition indicates. Assure the ED provider has  rounded once each shift. Provide and encourage hygiene. Provide redirection as needed. Assess for escalating behavior; address immediately and inform ED provider.  Assess family dynamic and appropriateness for visitation as needed: Yes. ; If necessary, describe findings:  Educate the patient/family about BHU procedures/visitation: Yes. ; If necessary, describe findings: Pt is calm and cooperative at this time.  Will continue to monitor with Q 15 min safety rounds and observation.     .Marland Kitchen

## 2016-06-04 NOTE — Progress Notes (Signed)
CSW called [facility] and spoke to Ms Juleen ChinaVivian Timmons- [phone] [facility] is out of town and will  now pick up patient and take home on Monday at 8:30am and we are to have in writing that the patient is OK for discharge.  Called Alberteen Spindleric Palmer Guardian and informed him patient will discharging back to group home on Monday. [phone]   Arrie [name] Jrue Yambao LCSW [phone]

## 2016-06-05 ENCOUNTER — Emergency Department
Admission: EM | Admit: 2016-06-05 | Discharge: 2016-06-26 | Disposition: A | Discharge status: Home / Self Care | Payer: Medicaid Other | Attending: Emergency Medicine | Admitting: Emergency Medicine

## 2016-06-05 DIAGNOSIS — N179 Acute kidney failure, unspecified: Secondary | ICD-10-CM | POA: Diagnosis present

## 2016-06-05 DIAGNOSIS — R456 Violent behavior: Secondary | ICD-10-CM | POA: Diagnosis not present

## 2016-06-05 DIAGNOSIS — Z79899 Other long term (current) drug therapy: Secondary | ICD-10-CM | POA: Diagnosis not present

## 2016-06-05 DIAGNOSIS — I441 Atrioventricular block, second degree: Secondary | ICD-10-CM | POA: Diagnosis present

## 2016-06-05 DIAGNOSIS — F71 Moderate intellectual disabilities: Secondary | ICD-10-CM | POA: Diagnosis present

## 2016-06-05 DIAGNOSIS — F259 Schizoaffective disorder, unspecified: Secondary | ICD-10-CM

## 2016-06-05 DIAGNOSIS — N3000 Acute cystitis without hematuria: Secondary | ICD-10-CM

## 2016-06-05 DIAGNOSIS — F918 Other conduct disorders: Secondary | ICD-10-CM | POA: Diagnosis present

## 2016-06-05 LAB — COMPREHENSIVE METABOLIC PANEL
ALT: 13 U/L — ABNORMAL LOW (ref 14–54)
AST: 23 U/L (ref 15–41)
Albumin: 3.5 g/dL (ref 3.5–5.0)
Alkaline Phosphatase: 46 U/L (ref 38–126)
Anion gap: 6 (ref 5–15)
BUN: 16 mg/dL (ref 6–20)
CHLORIDE: 108 mmol/L (ref 101–111)
CO2: 25 mmol/L (ref 22–32)
Calcium: 9.1 mg/dL (ref 8.9–10.3)
Creatinine, Ser: 1.28 mg/dL — ABNORMAL HIGH (ref 0.44–1.00)
GFR, EST NON AFRICAN AMERICAN: 58 mL/min — AB (ref >60)
Glucose, Bld: 147 mg/dL — ABNORMAL HIGH (ref 65–99)
POTASSIUM: 3.9 mmol/L (ref 3.5–5.1)
SODIUM: 139 mmol/L (ref 135–145)
Total Bilirubin: 0.1 mg/dL — ABNORMAL LOW (ref 0.3–1.2)
Total Protein: 6.7 g/dL (ref 6.5–8.1)

## 2016-06-05 LAB — CBC WITH DIFFERENTIAL/PLATELET
BASOS ABS: 0 10*3/uL (ref 0–0.1)
Basophils Relative: 0 %
EOS ABS: 0.4 10*3/uL (ref 0–0.7)
EOS PCT: 3 %
HCT: 36.7 % (ref 35.0–47.0)
Hemoglobin: 12.2 g/dL (ref 12.0–16.0)
LYMPHS PCT: 32 %
Lymphs Abs: 3.7 10*3/uL — ABNORMAL HIGH (ref 1.0–3.6)
MCH: 30.9 pg (ref 26.0–34.0)
MCHC: 33.3 g/dL (ref 32.0–36.0)
MCV: 92.7 fL (ref 80.0–100.0)
MONO ABS: 0.5 10*3/uL (ref 0.2–0.9)
Monocytes Relative: 4 %
Neutro Abs: 7.1 10*3/uL — ABNORMAL HIGH (ref 1.4–6.5)
Neutrophils Relative %: 61 %
PLATELETS: 229 10*3/uL (ref 150–440)
RBC: 3.96 MIL/uL (ref 3.80–5.20)
RDW: 12.8 % (ref 11.5–14.5)
WBC: 11.7 10*3/uL — AB (ref 3.6–11.0)

## 2016-06-05 LAB — SALICYLATE LEVEL

## 2016-06-05 LAB — ACETAMINOPHEN LEVEL

## 2016-06-05 LAB — ETHANOL

## 2016-06-05 NOTE — ED Notes (Signed)
BEHAVIORAL HEALTH ROUNDING Patient sleeping: Yes.   Patient alert and oriented: not applicable SLEEPING Behavior appropriate: Yes.  ; If no, describe: SLEEPING Nutrition and fluids offered: No SLEEPING Toileting and hygiene offered: NoSLEEPING Sitter present: not applicable, Q 15 min safety rounds and observation. Law enforcement present: Yes ODS 

## 2016-06-05 NOTE — ED Provider Notes (Signed)
-----------------------------------------   6:26 AM on 06/05/2016 -----------------------------------------   Blood pressure 100/65, pulse 88, temperature 98.5 F (36.9 C), temperature source Oral, resp. rate 15, height 5\' 2"  (1.575 m), weight 160 lb (72.6 kg), last menstrual period 05/28/2016, SpO2 100 %.  The patient had no acute events since last update.  Calm and cooperative at this time.  Disposition is pending Psychiatry/Behavioral Medicine team recommendations.     Irean HongJade J Sung, MD 06/05/16 [phone]
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
## 2016-06-05 NOTE — ED Provider Notes (Signed)
Patient appears cleared to return to her group home. I will overturn her commitment. She has been accepted back to her group home.   Hannah FilbertJonathan E Porsche Noguchi, MD 06/05/16 [phone]

## 2016-06-05 NOTE — ED Notes (Signed)
BEHAVIORAL HEALTH ROUNDING  Patient sleeping: No.  Patient alert and oriented: yes  Behavior appropriate: Yes. ; If no, describe:  Nutrition and fluids offered: Yes  Toileting and hygiene offered: Yes  Sitter present: not applicable, Q 15 min safety rounds and observation.  Law enforcement present: Yes ODS  

## 2016-06-05 NOTE — ED Notes (Signed)
Patient is taking a shower at this time in the QUAD shower. 

## 2016-06-05 NOTE — ED Notes (Signed)
Pt ivc d/ced. Taken to lobby for discharge via wheelchair. Discharge paperwork given to group home caregiver.
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
## 2016-06-05 NOTE — ED Notes (Addendum)
Pt calm, awake standing in doorway talking to other pts and staff. No acute issues at this time.
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
## 2016-06-06 DIAGNOSIS — F71 Moderate intellectual disabilities: Secondary | ICD-10-CM

## 2016-06-06 LAB — URINE DRUG SCREEN, QUALITATIVE (ARMC ONLY)
Amphetamines, Ur Screen: NOT DETECTED
Barbiturates, Ur Screen: NOT DETECTED
Benzodiazepine, Ur Scrn: POSITIVE — AB
COCAINE METABOLITE, UR ~~LOC~~: NOT DETECTED
Cannabinoid 50 Ng, Ur ~~LOC~~: NOT DETECTED
MDMA (ECSTASY) UR SCREEN: NOT DETECTED
METHADONE SCREEN, URINE: NOT DETECTED
Opiate, Ur Screen: NOT DETECTED
Phencyclidine (PCP) Ur S: NOT DETECTED
TRICYCLIC, UR SCREEN: NOT DETECTED

## 2016-06-06 MED ORDER — VITAMIN D 1000 UNITS PO TABS
1000.0000 [IU] | ORAL_TABLET | Freq: Two times a day (BID) | ORAL | Status: DC
  Administered 2016-06-06 – 2016-06-26 (×40): 1000 [IU] via ORAL
  Filled 2016-06-06 (×41): qty 1

## 2016-06-06 MED ORDER — LITHIUM CARBONATE 300 MG PO CAPS
600.0000 mg | ORAL_CAPSULE | Freq: Every day | ORAL | Status: DC
  Administered 2016-06-06 – 2016-06-25 (×19): 600 mg via ORAL
  Filled 2016-06-06 (×20): qty 2

## 2016-06-06 MED ORDER — ACETAMINOPHEN 325 MG PO TABS
325.0000 mg | ORAL_TABLET | Freq: Four times a day (QID) | ORAL | Status: DC | PRN
  Administered 2016-06-17 – 2016-06-21 (×2): 325 mg via ORAL
  Filled 2016-06-06 (×2): qty 1

## 2016-06-06 MED ORDER — DIVALPROEX SODIUM 125 MG PO CSDR
125.0000 mg | DELAYED_RELEASE_CAPSULE | Freq: Two times a day (BID) | ORAL | Status: DC
  Administered 2016-06-06 – 2016-06-26 (×39): 125 mg via ORAL
  Filled 2016-06-06 (×40): qty 1

## 2016-06-06 MED ORDER — PAROXETINE HCL 20 MG PO TABS
20.0000 mg | ORAL_TABLET | Freq: Every day | ORAL | Status: DC
  Administered 2016-06-07 – 2016-06-26 (×20): 20 mg via ORAL
  Filled 2016-06-06 (×20): qty 1

## 2016-06-06 MED ORDER — HYDROCHLOROTHIAZIDE 25 MG PO TABS
25.0000 mg | ORAL_TABLET | Freq: Every day | ORAL | Status: DC
  Administered 2016-06-06 – 2016-06-26 (×21): 25 mg via ORAL
  Filled 2016-06-06 (×21): qty 1

## 2016-06-06 MED ORDER — ARIPIPRAZOLE 5 MG PO TABS
15.0000 mg | ORAL_TABLET | Freq: Every day | ORAL | Status: DC
  Administered 2016-06-06 – 2016-06-25 (×20): 15 mg via ORAL
  Filled 2016-06-06 (×22): qty 3

## 2016-06-06 MED ORDER — DIVALPROEX SODIUM 125 MG PO CPSP: 375.0000 mg | ORAL_CAPSULE | Freq: Two times a day (BID) | ORAL | Status: DC

## 2016-06-06 MED ORDER — DOCUSATE SODIUM 100 MG PO CAPS
200.0000 mg | ORAL_CAPSULE | Freq: Every day | ORAL | Status: DC
  Administered 2016-06-06 – 2016-06-26 (×20): 200 mg via ORAL
  Filled 2016-06-06 (×20): qty 2

## 2016-06-06 MED ORDER — DIVALPROEX SODIUM 125 MG PO CSDR
DELAYED_RELEASE_CAPSULE | ORAL | Status: AC
  Administered 2016-06-07: 125 mg via ORAL
  Filled 2016-06-06: qty 1

## 2016-06-06 MED ORDER — ASENAPINE MALEATE 5 MG SL SUBL
SUBLINGUAL_TABLET | SUBLINGUAL | Status: AC
  Administered 2016-06-06: 10 mg via SUBLINGUAL
  Filled 2016-06-06: qty 2

## 2016-06-06 MED ORDER — BENZTROPINE MESYLATE 0.5 MG PO TABS
1.0000 mg | ORAL_TABLET | Freq: Two times a day (BID) | ORAL | Status: DC
  Administered 2016-06-06 – 2016-06-26 (×40): 1 mg via ORAL
  Filled 2016-06-06 (×42): qty 2

## 2016-06-06 MED ORDER — BUPROPION HCL ER (XL) 150 MG PO TB24
150.0000 mg | ORAL_TABLET | Freq: Every day | ORAL | Status: DC
  Administered 2016-06-07 – 2016-06-26 (×20): 150 mg via ORAL
  Filled 2016-06-06 (×20): qty 1

## 2016-06-06 MED ORDER — FERROUS SULFATE 325 (65 FE) MG PO TABS
325.0000 mg | ORAL_TABLET | Freq: Every day | ORAL | Status: DC
  Administered 2016-06-06 – 2016-06-26 (×21): 325 mg via ORAL
  Filled 2016-06-06 (×21): qty 1

## 2016-06-06 MED ORDER — LORAZEPAM 1 MG PO TABS
1.0000 mg | ORAL_TABLET | Freq: Two times a day (BID) | ORAL | Status: DC
  Administered 2016-06-06 – 2016-06-26 (×39): 1 mg via ORAL
  Filled 2016-06-06 (×38): qty 1

## 2016-06-06 MED ORDER — DIVALPROEX SODIUM 125 MG PO CPSP
125.0000 mg | ORAL_CAPSULE | Freq: Two times a day (BID) | ORAL | Status: DC
  Administered 2016-06-06: 125 mg via ORAL

## 2016-06-06 MED ORDER — LORAZEPAM 0.5 MG PO TABS
0.5000 mg | ORAL_TABLET | Freq: Three times a day (TID) | ORAL | Status: DC | PRN
  Administered 2016-06-12 – 2016-06-22 (×2): 0.5 mg via ORAL
  Filled 2016-06-06 (×7): qty 1

## 2016-06-06 MED ORDER — ASENAPINE MALEATE 5 MG SL SUBL
10.0000 mg | SUBLINGUAL_TABLET | Freq: Three times a day (TID) | SUBLINGUAL | Status: DC
  Administered 2016-06-06 – 2016-06-26 (×59): 10 mg via SUBLINGUAL
  Filled 2016-06-06 (×60): qty 2

## 2016-06-06 MED ORDER — ASENAPINE MALEATE 10 MG SL SUBL
1.0000 | SUBLINGUAL_TABLET | Freq: Three times a day (TID) | SUBLINGUAL | Status: DC
  Administered 2016-06-06: 10 mg via SUBLINGUAL
  Filled 2016-06-06 (×2): qty 1

## 2016-06-06 MED ORDER — BUPROPION HCL ER (SR) 150 MG PO TB12
ORAL_TABLET | ORAL | Status: AC
  Filled 2016-06-06: qty 1

## 2016-06-06 MED ORDER — PANTOPRAZOLE SODIUM 40 MG PO TBEC
40.0000 mg | DELAYED_RELEASE_TABLET | Freq: Every day | ORAL | Status: DC
  Administered 2016-06-06 – 2016-06-26 (×21): 40 mg via ORAL
  Filled 2016-06-06 (×21): qty 1

## 2016-06-06 NOTE — ED Notes (Signed)
Patient in shower 

## 2016-06-06 NOTE — ED Notes (Signed)
Pt observed with no unusual behavior  Appropriate to stimulation  No verbalized needs or concerns at this time  NAD assessed  Continue to monitor 

## 2016-06-06 NOTE — ED Notes (Signed)
BEHAVIORAL HEALTH ROUNDING Patient sleeping: No. Patient alert and oriented: oriented to self and place Behavior appropriate: Yes.  ; If no, describe:  Nutrition and fluids offered: yes Toileting and hygiene offered: Yes  Sitter present: q15 minute observations and security  monitoring Law enforcement present: Yes  ODS  

## 2016-06-06 NOTE — ED Notes (Signed)
BEHAVIORAL HEALTH ROUNDING Patient sleeping: No. Patient alert and oriented: yes Behavior appropriate: Yes.  ; If no, describe:  Nutrition and fluids offered: yes Toileting and hygiene offered: Yes  Sitter present: q15 minute observations and security  monitoring Law enforcement present: Yes  ODS  

## 2016-06-06 NOTE — ED Notes (Signed)
Awaiting wellbutrin and paxil to come from pharmacy

## 2016-06-06 NOTE — ED Notes (Signed)
BEHAVIORAL HEALTH ROUNDING Patient sleeping: No. Patient alert and oriented: oriented to self and place Behavior appropriate: Yes.  ; If no, describe:  Nutrition and fluids offered: yes Toileting and hygiene offered: Yes  Sitter present: q15 minute observations and security monitoring Law enforcement present: Yes  ODS  ENVIRONMENTAL ASSESSMENT Potentially harmful objects out of patient reach: Yes.   Personal belongings secured: Yes.   Patient dressed in hospital provided attire only: Yes.   Plastic bags out of patient reach: Yes.   Patient care equipment (cords, cables, call bells, lines, and drains) shortened, removed, or accounted for: Yes.   Equipment and supplies removed from bottom of stretcher: Yes.   Potentially toxic materials out of patient reach: Yes.   Sharps container removed or out of patient reach: Yes.

## 2016-06-06 NOTE — Consult Note (Signed)
[facility]   Reason for Consult:  25 year old woman with chronic cognitive impairment and behavior problems brought back after attempted discharge. Referring Physician:  Corky Downs. Patient Identification: Hannah Keller MRN:  [id number] Principal Diagnosis: Mental retardation, idiopathic moderate Diagnosis:   Patient Active Problem List   Diagnosis Date Noted  . Seizures ([place]) [R56.9] 05/22/2016  . 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 Paranoid schizophrenia ([place]) [F20.0] 04/12/2016  . Acute renal failure syndrome ([place]) [N17.9]   . 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 Sinus pause [I45.5]   . Lithium toxicity [T56.891A] 03/29/2015  . Mental retardation, idiopathic moderate [F71] 12/01/2014    Total Time spent with patient: 30 minutes  Subjective:   Hannah Keller is a 25 y.o. female patient admitted with "I got mad".  HPI:  Information largely from the chart also from speaking with the patient again briefly. Patient of course is a very limited historian. She does say that she got mad when she was discharged last time. According to the intake note she was reported to be refusing to get out of the car and got aggressive with staff and other clients in some way. This was just after being released from the hospital emergency room last time back to her group home. Doesn't look like they had time enough to give her any medication. She's been put back on her medicine sheet since she has come back here. No other new stressors compared to previous admissions.  Social history: Patient is a 25 year old woman with a legal guardian who has had difficulty maintaining residence in any group home. Keeps having behaviors acting out getting aggressive. Difficult placement.  Medical history: Patient appears to have some sort of general chronic neurologic problem possibly genetic. Multiple obvious problems including extremely short stature, some malformations and chronic nystagmus.  Substance  abuse history: None  Past Psychiatric History: Patient has been given a diagnosis of schizoaffective disorder although it sounds like largely this refers to chronic behavior problems. Hard to distinguish the difference between psychosis and just behavior problems and someone as developmentally disabled as her. Multiple medications have been tried with only partial benefit.  Risk to Self: Suicidal Ideation: No Suicidal Intent: No Is patient at risk for suicide?: No Suicidal Plan?: No Access to Means: No What has been your use of drugs/alcohol within the last 12 months?: denied use Other Self Harm Risks: denied Triggers for Past Attempts: None known Intentional Self Injurious Behavior: None Risk to Others: Homicidal Ideation: No Thoughts of Harm to Others: No Current Homicidal Intent: No Current Homicidal Plan: No Access to Homicidal Means: No Identified Victim: None Identified History of harm to others?: Yes Assessment of Violence: On admission Violent Behavior Description: assaulting house mates, verbal aggression to staff Does patient have access to weapons?: No Criminal Charges Pending?: No Does patient have a court date: No Prior Inpatient Therapy: Prior Inpatient Therapy: Yes Prior Therapy Dates: 2010 Prior Therapy Facilty/Provider(s): [facility] Reason for Treatment: Schizophrenia Prior Outpatient Therapy: Prior Outpatient Therapy: Yes Prior Therapy Dates: Current Prior Therapy Facilty/Provider([facility]): [facility] Reason for Treatment: Schizoaffective Disorder, Bipolar Disorder Does patient have an ACCT team?: No Does patient have Intensive In-House Services?  : No Does patient have [facility] services? : No Does patient have P4CC services?: No  Past Medical History:  Past Medical History:  Diagnosis Date  . Developmental delay   . Mental retardation    Mild  . Paranoid schizophrenia ([place])   . Seizures ([place])  Past Surgical History:  Procedure Laterality Date  .  ESOPHAGOGASTRODUODENOSCOPY (EGD) WITH PROPOFOL N/A 05/18/2015   Procedure: ESOPHAGOGASTRODUODENOSCOPY (EGD) WITH PROPOFOL;  Surgeon: Lollie Sails, MD;  Location[facility] ENDOSCOPY;  Service: Endoscopy;  Laterality: N/A;   Family History:  Family History  Problem Relation Age of Onset  . Family history unknown: Yes   Family Psychiatric  History: Nonidentified Social History:  History  Alcohol Use No     History  Drug Use No    Social History   Social History  . Marital status: Single    Spouse name: N/A  . Number of children: 0  . Years of education: n/a   Occupational History  . Disabled    Social History Main Topics  . Smoking status: Never Smoker  . Smokeless tobacco: Never Used  . Alcohol use No  . Drug use: No  . Sexual activity: Not Asked   Other Topics Concern  . None   Social History Narrative   Pt currently resides at [facility].   Caffeine Use: None   Additional Social History:    Allergies:   Allergies  Allergen Reactions  . Ritalin [Methylphenidate Hcl] Other (See Comments)    Reaction:  Unknown     Labs:  Results for orders placed or performed during the hospital encounter of 06/05/16 (from the past 48 hour(s))  CBC with Differential/Platelet     Status: Abnormal   Collection Time: 06/05/16  8:29 PM  Result Value Ref Range   WBC 11.7 (H) 3.6 - 11.0 K/uL   RBC 3.96 3.80 - 5.20 MIL/uL   Hemoglobin 12.2 12.0 - 16.0 g/dL   HCT 36.7 35.0 - 47.0 %   MCV 92.7 80.0 - 100.0 fL   MCH 30.9 26.0 - 34.0 pg   MCHC 33.3 32.0 - 36.0 g/dL   RDW 12.8 11.5 - 14.5 %   Platelets 229 150 - 440 K/uL   Neutrophils Relative % 61 %   Neutro Abs 7.1 (H) 1.4 - 6.5 K/uL   Lymphocytes Relative 32 %   Lymphs Abs 3.7 (H) 1.0 - 3.6 K/uL   Monocytes Relative 4 %   Monocytes Absolute 0.5 0.2 - 0.9 K/uL   Eosinophils Relative 3 %   Eosinophils Absolute 0.4 0 - 0.7 K/uL   Basophils Relative 0 %   Basophils Absolute 0.0 0 - 0.1 K/uL  Comprehensive metabolic  panel     Status: Abnormal   Collection Time: 06/05/16  8:29 PM  Result Value Ref Range   Sodium 139 135 - 145 mmol/L   Potassium 3.9 3.5 - 5.1 mmol/L   Chloride 108 101 - 111 mmol/L   CO2 25 22 - 32 mmol/L   Glucose, Bld 147 (H) 65 - 99 mg/dL   BUN 16 6 - 20 mg/dL   Creatinine, Ser 1.28 (H) 0.44 - 1.00 mg/dL   Calcium 9.1 8.9 - 10.3 mg/dL   Total Protein 6.7 6.5 - 8.1 g/dL   Albumin 3.5 3.5 - 5.0 g/dL   AST 23 15 - 41 U/L   ALT 13 (L) 14 - 54 U/L   Alkaline Phosphatase 46 38 - 126 U/L   Total Bilirubin 0.1 (L) 0.3 - 1.2 mg/dL   GFR calc non Af Amer 58 (L) >60 mL/min   GFR calc Af Amer >60 >60 mL/min    Comment: (NOTE) The eGFR has been calculated using the CKD EPI equation. This calculation has not been validated in all clinical situations. eGFR's  persistently <60 mL/min signify possible Chronic Kidney Disease.    Anion gap 6 5 - 15  Ethanol     Status: None   Collection Time: 06/05/16  8:29 PM  Result Value Ref Range   Alcohol, Ethyl (B) <5 <5 mg/dL    Comment:        LOWEST DETECTABLE LIMIT FOR SERUM ALCOHOL IS 5 mg/dL FOR MEDICAL PURPOSES ONLY   Salicylate level     Status: None   Collection Time: 06/05/16  8:29 PM  Result Value Ref Range   Salicylate Lvl <5.8 2.8 - 30.0 mg/dL  Acetaminophen level     Status: Abnormal   Collection Time: 06/05/16  8:29 PM  Result Value Ref Range   Acetaminophen (Tylenol), Serum <10 (L) 10 - 30 ug/mL    Comment:        THERAPEUTIC CONCENTRATIONS VARY SIGNIFICANTLY. A RANGE OF 10-30 ug/mL MAY BE AN EFFECTIVE CONCENTRATION FOR MANY PATIENTS. HOWEVER, SOME ARE BEST TREATED AT CONCENTRATIONS OUTSIDE THIS RANGE. ACETAMINOPHEN CONCENTRATIONS >150 ug/mL AT 4 HOURS AFTER INGESTION AND >50 ug/mL AT 12 HOURS AFTER INGESTION ARE OFTEN ASSOCIATED WITH TOXIC REACTIONS.   Urine Drug Screen, Qualitative ([place] only)     Status: Abnormal   Collection Time: 06/06/16  3:08 PM  Result Value Ref Range   Tricyclic, Ur Screen NONE DETECTED  NONE DETECTED   Amphetamines, Ur Screen NONE DETECTED NONE DETECTED   MDMA (Ecstasy)Ur Screen NONE DETECTED NONE DETECTED   Cocaine Metabolite,Ur [place] NONE DETECTED NONE DETECTED   Opiate, Ur Screen NONE DETECTED NONE DETECTED   Phencyclidine (PCP) Ur S NONE DETECTED NONE DETECTED   Cannabinoid 50 Ng, Ur [place] NONE DETECTED NONE DETECTED   Barbiturates, Ur Screen NONE DETECTED NONE DETECTED   Benzodiazepine, Ur Scrn POSITIVE (A) NONE DETECTED   Methadone Scn, Ur NONE DETECTED NONE DETECTED    Comment: (NOTE) 527  Tricyclics, urine               Cutoff 1000 ng/mL 200  Amphetamines, urine             Cutoff 1000 ng/mL 300  MDMA (Ecstasy), urine           Cutoff 500 ng/mL 400  Cocaine Metabolite, urine       Cutoff 300 ng/mL 500  Opiate, urine                   Cutoff 300 ng/mL 600  Phencyclidine (PCP), urine      Cutoff 25 ng/mL 700  Cannabinoid, urine              Cutoff 50 ng/mL 800  Barbiturates, urine             Cutoff 200 ng/mL 900  Benzodiazepine, urine           Cutoff 200 ng/mL 1000 Methadone, urine                Cutoff 300 ng/mL 1100 1200 The urine drug screen provides only a preliminary, unconfirmed 1300 analytical test result and should not be used for non-medical 1400 purposes. Clinical consideration and professional judgment should 1500 be applied to any positive drug screen result due to possible 1600 interfering substances. A more specific alternate chemical method 1700 must be used in order to obtain a confirmed analytical result.  1800 Gas chromato graphy / mass spectrometry (GC/MS) is the preferred 1900 confirmatory method.     Current Facility-Administered Medications  Medication Dose Route Frequency Provider  Last Rate Last Dose  . acetaminophen (TYLENOL) tablet 325 mg  325 mg Oral Q6H PRN Lavonia Drafts, MD      . ARIPiprazole (ABILIFY) tablet 15 mg  15 mg Oral Q1200 Lavonia Drafts, MD   15 mg at 06/06/16 1102  . asenapine (SAPHRIS) sublingual tablet 10 mg  10 mg  Sublingual TID Lavonia Drafts, MD      . benztropine (COGENTIN) tablet 1 mg  1 mg Oral BID Lavonia Drafts, MD   1 mg at 06/06/16 1103  . buPROPion (WELLBUTRIN SR) 150 MG 12 hr tablet           . buPROPion (WELLBUTRIN XL) 24 hr tablet 150 mg  150 mg Oral Daily Lavonia Drafts, MD      . cholecalciferol (VITAMIN D) tablet 1,000 Units  1,000 Units Oral BID Lavonia Drafts, MD   1,000 Units at 06/06/16 1103  . divalproex (DEPAKOTE SPRINKLE) 125 MG capsule           . divalproex (DEPAKOTE SPRINKLE) capsule 125 mg  125 mg Oral Q12H Lavonia Drafts, MD      . docusate sodium (COLACE) capsule 200 mg  200 mg Oral Daily Lavonia Drafts, MD   200 mg at 06/06/16 1101  . ferrous sulfate tablet 325 mg  325 mg Oral Daily Lavonia Drafts, MD   325 mg at 06/06/16 1104  . hydrochlorothiazide (HYDRODIURIL) tablet 25 mg  25 mg Oral Daily Lavonia Drafts, MD   25 mg at 06/06/16 1101  . lithium carbonate capsule 600 mg  600 mg Oral QHS Lavonia Drafts, MD      . LORazepam (ATIVAN) tablet 0.5 mg  0.5 mg Oral Q8H PRN Lavonia Drafts, MD      . LORazepam (ATIVAN) tablet 1 mg  1 mg Oral BID Lavonia Drafts, MD   1 mg at 06/06/16 1104  . pantoprazole (PROTONIX) EC tablet 40 mg  40 mg Oral Daily Lavonia Drafts, MD   40 mg at 06/06/16 1100  . PARoxetine (PAXIL) tablet 20 mg  20 mg Oral Daily Lavonia Drafts, MD       Current Outpatient Prescriptions  Medication Sig Dispense Refill  . acetaminophen (TYLENOL) 325 MG tablet Take 325 mg by mouth every 6 (six) hours as needed for mild pain, fever or headache.    . ARIPiprazole (ABILIFY) 15 MG tablet Take 15 mg by mouth daily at 12 noon.    . Asenapine Maleate (SAPHRIS) 10 MG SUBL Place 1 tablet under the tongue 3 (three) times daily.     . benztropine (COGENTIN) 1 MG tablet Take 1 tablet (1 mg total) by mouth 2 (two) times daily. 60 tablet 0  . buPROPion (WELLBUTRIN XL) 150 MG 24 hr tablet Take 150 mg by mouth daily.    . cholecalciferol (VITAMIN D) 1000 UNITS tablet Take 1,000 Units by mouth 2  (two) times daily.    . cloNIDine (CATAPRES - DOSED IN MG/24 HR) 0.3 mg/24hr patch Place 0.3 mg onto the skin once a week. Pt applies on Friday.    . diphenhydrAMINE (BENADRYL) 50 MG capsule Take 1 capsule (50 mg total) by mouth daily. 14 capsule 0  . divalproex (DEPAKOTE SPRINKLE) 125 MG capsule Take 375-750 mg by mouth 2 (two) times daily. 366m in the morning and 7575mat night    . docusate sodium (COLACE) 100 MG capsule Take 200 mg by mouth daily.     . ferrous sulfate 325 (65 FE) MG tablet Take 325 mg by mouth daily.    .Marland Kitchen  hydrochlorothiazide (HYDRODIURIL) 25 MG tablet Take 25 mg by mouth daily.    Marland Kitchen lithium carbonate 300 MG capsule Take 1 capsule (300 mg total) by mouth at bedtime. (Patient taking differently: Take 600 mg by mouth at bedtime. ) 20 capsule 0  . LORazepam (ATIVAN) 0.5 MG tablet Take 0.5 mg by mouth every 8 (eight) hours as needed for anxiety.    Marland Kitchen LORazepam (ATIVAN) 1 MG tablet Take 1 mg by mouth 2 (two) times daily.    . meclizine (ANTIVERT) 25 MG tablet Take 25 mg by mouth 3 (three) times daily as needed for dizziness.    . medroxyPROGESTERone (DEPO-PROVERA) 150 MG/ML injection Inject 150 mg into the muscle every 3 (three) months.    . naltrexone (DEPADE) 50 MG tablet Take 50 mg by mouth daily.    . Olopatadine HCl (PATADAY) 0.2 % SOLN Apply 1 drop to eye daily as needed (for allergies).     Marland Kitchen omeprazole (PRILOSEC) 20 MG capsule Take 20 mg by mouth daily.    Marland Kitchen PARoxetine (PAXIL) 20 MG tablet Take 20 mg by mouth daily.    . polyethylene glycol (MIRALAX / GLYCOLAX) packet Take 17 g by mouth every other day.    Marland Kitchen QUEtiapine (SEROQUEL) 50 MG tablet Take 1 tablet (50 mg total) by mouth every 6 (six) hours as needed.    . solifenacin (VESICARE) 10 MG tablet Take 10 mg by mouth daily.    . valACYclovir (VALTREX) 1000 MG tablet Take 2,000 mg by mouth 2 (two) times daily as needed (for fever blisters).      Musculoskeletal: Strength & Muscle Tone: within normal limits Gait &  Station: broad based Patient leans: N/A  Psychiatric Specialty Exam: Physical Exam  Nursing note and vitals reviewed. Constitutional: She appears well-developed and well-nourished.  HENT:  Head: Normocephalic and atraumatic.  Eyes: Conjunctivae are normal. Pupils are equal, round, and reactive to light.  Neck: Normal range of motion.  Cardiovascular: Regular rhythm and normal heart sounds.   Respiratory: Effort normal.  GI: Soft.  Musculoskeletal: Normal range of motion.  Neurological: She is alert.  Skin: Skin is warm and dry.  Psychiatric: Her affect is labile. Her speech is delayed and slurred. She is withdrawn. Thought content is delusional. She expresses impulsivity. She is noncommunicative. She exhibits abnormal recent memory and abnormal remote memory.    Review of Systems  Constitutional: Negative.   HENT: Negative.   Eyes: Negative.   Respiratory: Negative.   Cardiovascular: Negative.   Gastrointestinal: Negative.   Musculoskeletal: Negative.   Skin: Negative.   Neurological: Negative.   Psychiatric/Behavioral: Negative.     Blood pressure 110/60, pulse 84, temperature 98.2 F (36.8 C), temperature source Oral, resp. rate 18, height _0  (1.575 m), weight 72.6 kg (160 lb), last menstrual period 05/28/2016, SpO2 100 %.Body mass index is 29.26 kg/m.  General Appearance: Casual  Eye Contact:  Minimal  Speech:  Garbled  Volume:  Decreased  Mood:  Euphoric  Affect:  Constricted  Thought Process:  Disorganized  Orientation:  NA  Thought Content:  Illogical and Tangential  Suicidal Thoughts:  No  Homicidal Thoughts:  No  Memory:  Immediate;   Fair Recent;   Poor Remote;   Poor  Judgement:  Impaired  Insight:  Lacking  Psychomotor Activity:  Decreased  Concentration:  Concentration: Poor  Recall:  [place] of Knowledge:  Poor  Language:  Poor  Akathisia:  Negative  Handed:  Right  AIMS (if indicated):  Assets:  Financial Resources/Insurance Social  Support  ADL's:  Impaired  Cognition:  Impaired,  Moderate  Sleep:        Treatment Plan Summary: Daily contact with patient to assess and evaluate symptoms and progress in treatment, Medication management and Plan 25 year old woman with long-standing behavior problems that have been resistant to treatment that has been available so far. We have made multiple attempts to discharge her back to her group home. Last time she was in the emergency room I even made several adjustments to medication and we watched her for about 4 days before discharging her. She rarely has any kind of behavior problems in the emergency room but apparently is constantly acting out when she gets home. At this point it seems unlikely that we will be able to place her again. Treatment team is working on possible referral to state institution. Continue current medicines for now.  Disposition: Patient does not meet criteria for psychiatric inpatient admission.  Alethia Berthold, MD 06/06/2016 4:32 PM

## 2016-06-06 NOTE — ED Notes (Signed)
Supper provided along with a ginger ale

## 2016-06-06 NOTE — ED Notes (Addendum)
Patient given clean scrubs and undergarments along with wipes and deodorant  to clean up. Patient cleaned up and I assisted patient with cleaning up her room and bed. We put on clean sheets. Gave patient diet sprite. Patient very greatful for help

## 2016-06-06 NOTE — ED Notes (Signed)
Patient given peanut butter and crackers for snack. 

## 2016-06-06 NOTE — ED Notes (Signed)
ED BHU PLACEMENT JUSTIFICATION Is the patient under IVC or is there intent for IVC: Yes.   Is the patient medically cleared: Yes.   Is there vacancy in the ED BHU: Yes.   Is the population mix appropriate for patient: Yes.   Is the patient awaiting placement in inpatient or outpatient setting: Yes.   Group home placement  Has the patient had a psychiatric consult: Yes.   Survey of unit performed for contraband, proper placement and condition of furniture, tampering with fixtures in bathroom, shower, and each patient room: Yes.  ; Findings:  APPEARANCE/BEHAVIOR Calm and cooperative NEURO ASSESSMENT Orientation: oriented x 2  Denies pain Hallucinations: No.None noted (Hallucinations) Speech: Normal Gait: normal RESPIRATORY ASSESSMENT Even  Unlabored respirations  CARDIOVASCULAR ASSESSMENT Pulses equal   regular rate  Skin warm and dry   GASTROINTESTINAL ASSESSMENT no GI complaint EXTREMITIES Full ROM  PLAN OF CARE Provide calm/safe environment. Vital signs assessed twice daily. ED BHU Assessment once each 12-hour shift. Collaborate with TTS daily or as condition indicates. Assure the ED provider has rounded once each shift. Provide and encourage hygiene. Provide redirection as needed. Assess for escalating behavior; address immediately and inform ED provider.  Assess family dynamic and appropriateness for visitation as needed: Yes.  ; If necessary, describe findings:  Educate the patient/family about BHU procedures/visitation: Yes.  ; If necessary, describe findings:

## 2016-06-06 NOTE — ED Notes (Signed)
She is currently in the shower 

## 2016-06-06 NOTE — BH Assessment (Signed)
Writer called and confirmed([phone]) patient remains on [facility] Wait List. 

## 2016-06-06 NOTE — ED Notes (Signed)
BEHAVIORAL HEALTH ROUNDING Patient sleeping: No. - she is lying in bed with her TV on  Patient alert and oriented: yes Behavior appropriate: Yes.  ; If no, describe:  Nutrition and fluids offered: yes Toileting and hygiene offered: Yes  Sitter present: q15 minute observations and security  monitoring Law enforcement present: Yes  ODS

## 2016-06-07 NOTE — ED Notes (Signed)
Patient in shower 

## 2016-06-07 NOTE — ED Notes (Signed)
BEHAVIORAL HEALTH ROUNDING  Patient sleeping: Yes Patient alert and oriented: Sleeping Behavior appropriate: Yes. ; If no, describe:  Nutrition and fluids offered: No, sleeping  Toileting and hygiene offered: No, sleeping  Sitter present: q15 minute observations and security monitoring  Law enforcement present: Yes ODS 

## 2016-06-07 NOTE — ED Notes (Addendum)
BEHAVIORAL HEALTH ROUNDING Patient sleeping: No. Patient alert and oriented: oriented to self and place Behavior appropriate: Yes.  ; If no, describe:  Nutrition and fluids offered: yes Toileting and hygiene offered: Yes  Sitter present: q15 minute observations and security  monitoring Law enforcement present: Yes  ODS  

## 2016-06-07 NOTE — ED Notes (Signed)
BEHAVIORAL HEALTH ROUNDING Patient sleeping: No. Patient alert and oriented: yes Behavior appropriate: Yes.  ; If no, describe:  Nutrition and fluids offered: yes Toileting and hygiene offered: Yes  Sitter present: q15 minute observations and security  monitoring Law enforcement present: Yes  ODS  

## 2016-06-07 NOTE — ED Notes (Signed)
Pt standing in her room  NAD observed  Greeted her good morning and told her my name again  Continue to monitor

## 2016-06-07 NOTE — ED Notes (Signed)
Breakfast was given to patient. 

## 2016-06-07 NOTE — BH Assessment (Signed)
Writer called and confirmed([phone]) [facility]

## 2016-06-07 NOTE — ED Notes (Signed)
Am meds administered as ordered - pt does like to take her meds whole in applesauce    Assessment completed  Pt reports  "i hurt a little bit in my legs."  Behavior appropriate to circumstances this am  Oriented to self and place

## 2016-06-07 NOTE — ED Notes (Addendum)
BEHAVIORAL HEALTH ROUNDING Patient sleeping: No. Patient alert and oriented: oriented to self and place Behavior appropriate: Yes.  ; If no, describe:  Nutrition and fluids offered: yes Toileting and hygiene offered: Yes  Sitter present: q15 minute observations and security monitoring Law enforcement present: Yes  ODS  Pt to the shower at this time    ENVIRONMENTAL ASSESSMENT Potentially harmful objects out of patient reach: Yes.   Personal belongings secured: Yes.   Patient dressed in hospital provided attire only: Yes.   Plastic bags out of patient reach: Yes.   Patient care equipment (cords, cables, call bells, lines, and drains) shortened, removed, or accounted for: Yes.   Equipment and supplies removed from bottom of stretcher: Yes.   Potentially toxic materials out of patient reach: Yes.   Sharps container removed or out of patient reach: Yes.

## 2016-06-07 NOTE — Consult Note (Signed)
[facility] Face-to-Face Psychiatry Consult   Reason for Consult:  25 year old woman with chronic cognitive impairment and behavior problems brought back after attempted discharge. Referring Physician:  Corky Downs. Patient Identification: Hannah Keller MRN:  [id number] Principal Diagnosis: Mental retardation, idiopathic moderate Diagnosis:   Patient Active Problem List   Diagnosis Date Noted  . Seizures ([place]) [R56.9] 05/22/2016  . 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 Paranoid schizophrenia ([place]) [F20.0] 04/12/2016  . Acute renal failure syndrome ([place]) [N17.9]   . 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 Sinus pause [I45.5]   . Lithium toxicity [T56.891A] 03/29/2015  . 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 female patient admitted with "I got mad". Follow-up Wednesday the eighth. No new complaints. Patient occasionally gets a little bit worked up and starts singing or chanting but has not been violent or aggressive. Sometimes needs a lot of redirection but ultimately has been behaving herself okay. No new physical complaints. No attempts to harm herself.  HPI:  Information largely from the chart also from speaking with the patient again briefly. Patient of course is a very limited historian. She does say that she got mad when she was discharged last time. According to the intake note she was reported to be refusing to get out of the car and got aggressive with staff and other clients in some way. This was just after being released from the hospital emergency room last time back to her group home. Doesn't look like they had time enough to give her any medication. She's been put back on her medicine sheet since she has come back here. No other new stressors compared to previous admissions.  Social history: Patient is a 25 year old woman with a legal guardian who has had difficulty maintaining residence in any  group home. Keeps having behaviors acting out getting aggressive. Difficult placement.  Medical history: Patient appears to have some sort of general chronic neurologic problem possibly genetic. Multiple obvious problems including extremely short stature, some malformations and chronic nystagmus.  Substance abuse history: None  Past Psychiatric History: Patient has been given a diagnosis of schizoaffective disorder although it sounds like largely this refers to chronic behavior problems. Hard to distinguish the difference between psychosis and just behavior problems and someone as developmentally disabled as her. Multiple medications have been tried with only partial benefit.  Risk to Self: Suicidal Ideation: No Suicidal Intent: No Is patient at risk for suicide?: No Suicidal Plan?: No Access to Means: No What has been your use of drugs/alcohol within the last 12 months?: denied use Other Self Harm Risks: denied Triggers for Past Attempts: None known Intentional Self Injurious Behavior: None Risk to Others: Homicidal Ideation: No Thoughts of Harm to Others: No Current Homicidal Intent: No Current Homicidal Plan: No Access to Homicidal Means: No Identified Victim: None Identified History of harm to others?: Yes Assessment of Violence: On admission Violent Behavior Description: assaulting house mates, verbal aggression to staff Does patient have access to weapons?: No Criminal Charges Pending?: No Does patient have a court date: No Prior Inpatient Therapy: Prior Inpatient Therapy: Yes Prior Therapy Dates: 2010 Prior Therapy Facilty/Provider(s): [facility] Reason for Treatment: Schizophrenia Prior Outpatient Therapy: Prior Outpatient Therapy: Yes Prior Therapy Dates: Current Prior Therapy Facilty/Provider(s): [facility] Reason for Treatment: Schizoaffective Disorder, Bipolar Disorder Does patient have an ACCT team?: No Does patient have Intensive In-House Services?  : No Does  patient have [facility] services? : No Does patient have P4CC services?: No  Past Medical History:  Past Medical History:  Diagnosis Date  . Developmental delay   . Mental retardation    Mild  . Paranoid schizophrenia ([place])   . Seizures ([place])     Past Surgical History:  Procedure Laterality Date  . ESOPHAGOGASTRODUODENOSCOPY (EGD) WITH PROPOFOL N/A 05/18/2015   Procedure: ESOPHAGOGASTRODUODENOSCOPY (EGD) WITH PROPOFOL;  Surgeon: Lollie Sails, MD;  Location: [facility] ENDOSCOPY;  Service: Endoscopy;  Laterality: N/A;   Family History:  Family History  Problem Relation Age of Onset  . Family history unknown: Yes   Family Psychiatric  History: Nonidentified Social History:  History  Alcohol Use No     History  Drug Use No    Social History   Social History  . Marital status: Single    Spouse name: N/A  . Number of children: 0  . Years of education: n/a   Occupational History  . Disabled    Social History Main Topics  . Smoking status: Never Smoker  . Smokeless tobacco: Never Used  . Alcohol use No  . Drug use: No  . Sexual activity: Not Asked   Other Topics Concern  . None   Social History Narrative   Pt currently resides at [facility].   Caffeine Use: None   Additional Social History:    Allergies:   Allergies  Allergen Reactions  . Ritalin [Methylphenidate Hcl] Other (See Comments)    Reaction:  Unknown     Labs:  Results for orders placed or performed during the hospital encounter of 06/05/16 (from the past 48 hour(s))  CBC with Differential/Platelet     Status: Abnormal   Collection Time: 06/05/16  8:29 PM  Result Value Ref Range   WBC 11.7 (H) 3.6 - 11.0 K/uL   RBC 3.96 3.80 - 5.20 MIL/uL   Hemoglobin 12.2 12.0 - 16.0 g/dL   HCT 36.7 35.0 - 47.0 %   MCV 92.7 80.0 - 100.0 fL   MCH 30.9 26.0 - 34.0 pg   MCHC 33.3 32.0 - 36.0 g/dL   RDW 12.8 11.5 - 14.5 %   Platelets 229 150 - 440 K/uL   Neutrophils Relative % 61 %   Neutro Abs 7.1  (H) 1.4 - 6.5 K/uL   Lymphocytes Relative 32 %   Lymphs Abs 3.7 (H) 1.0 - 3.6 K/uL   Monocytes Relative 4 %   Monocytes Absolute 0.5 0.2 - 0.9 K/uL   Eosinophils Relative 3 %   Eosinophils Absolute 0.4 0 - 0.7 K/uL   Basophils Relative 0 %   Basophils Absolute 0.0 0 - 0.1 K/uL  Comprehensive metabolic panel     Status: Abnormal   Collection Time: 06/05/16  8:29 PM  Result Value Ref Range   Sodium 139 135 - 145 mmol/L   Potassium 3.9 3.5 - 5.1 mmol/L   Chloride 108 101 - 111 mmol/L   CO2 25 22 - 32 mmol/L   Glucose, Bld 147 (H) 65 - 99 mg/dL   BUN 16 6 - 20 mg/dL   Creatinine, Ser 1.28 (H) 0.44 - 1.00 mg/dL   Calcium 9.1 8.9 - 10.3 mg/dL   Total Protein 6.7 6.5 - 8.1 g/dL   Albumin 3.5 3.5 - 5.0 g/dL   AST 23 15 - 41 U/L   ALT 13 (L) 14 - 54 U/L   Alkaline Phosphatase 46 38 - 126 U/L   Total Bilirubin 0.1 (L) 0.3 -  1.2 mg/dL   GFR calc non Af Amer 58 (L) >60 mL/min   GFR calc Af Amer >60 >60 mL/min    Comment: (NOTE) The eGFR has been calculated using the CKD EPI equation. This calculation has not been validated in all clinical situations. eGFR's persistently <60 mL/min signify possible Chronic Kidney Disease.    Anion gap 6 5 - 15  Ethanol     Status: None   Collection Time: 06/05/16  8:29 PM  Result Value Ref Range   Alcohol, Ethyl (B) <5 <5 mg/dL    Comment:        LOWEST DETECTABLE LIMIT FOR SERUM ALCOHOL IS 5 mg/dL FOR MEDICAL PURPOSES ONLY   Salicylate level     Status: None   Collection Time: 06/05/16  8:29 PM  Result Value Ref Range   Salicylate Lvl <8.1 2.8 - 30.0 mg/dL  Acetaminophen level     Status: Abnormal   Collection Time: 06/05/16  8:29 PM  Result Value Ref Range   Acetaminophen (Tylenol), Serum <10 (L) 10 - 30 ug/mL    Comment:        THERAPEUTIC CONCENTRATIONS VARY SIGNIFICANTLY. A RANGE OF 10-30 ug/mL MAY BE AN EFFECTIVE CONCENTRATION FOR MANY PATIENTS. HOWEVER, SOME ARE BEST TREATED AT CONCENTRATIONS OUTSIDE THIS RANGE. ACETAMINOPHEN  CONCENTRATIONS >150 ug/mL AT 4 HOURS AFTER INGESTION AND >50 ug/mL AT 12 HOURS AFTER INGESTION ARE OFTEN ASSOCIATED WITH TOXIC REACTIONS.   Urine Drug Screen, Qualitative ([place] only)     Status: Abnormal   Collection Time: 06/06/16  3:08 PM  Result Value Ref Range   Tricyclic, Ur Screen NONE DETECTED NONE DETECTED   Amphetamines, Ur Screen NONE DETECTED NONE DETECTED   MDMA (Ecstasy)Ur Screen NONE DETECTED NONE DETECTED   Cocaine Metabolite,Ur [place] NONE DETECTED NONE DETECTED   Opiate, Ur Screen NONE DETECTED NONE DETECTED   Phencyclidine (PCP) Ur S NONE DETECTED NONE DETECTED   Cannabinoid 50 Ng, Ur [place] NONE DETECTED NONE DETECTED   Barbiturates, Ur Screen NONE DETECTED NONE DETECTED   Benzodiazepine, Ur Scrn POSITIVE (A) NONE DETECTED   Methadone Scn, Ur NONE DETECTED NONE DETECTED    Comment: (NOTE) 856  Tricyclics, urine               Cutoff 1000 ng/mL 200  Amphetamines, urine             Cutoff 1000 ng/mL 300  MDMA (Ecstasy), urine           Cutoff 500 ng/mL 400  Cocaine Metabolite, urine       Cutoff 300 ng/mL 500  Opiate, urine                   Cutoff 300 ng/mL 600  Phencyclidine (PCP), urine      Cutoff 25 ng/mL 700  Cannabinoid, urine              Cutoff 50 ng/mL 800  Barbiturates, urine             Cutoff 200 ng/mL 900  Benzodiazepine, urine           Cutoff 200 ng/mL 1000 Methadone, urine                Cutoff 300 ng/mL 1100 1200 The urine drug screen provides only a preliminary, unconfirmed 1300 analytical test result and should not be used for non-medical 1400 purposes. Clinical consideration and professional judgment should 1500 be applied to any positive drug screen result due to possible 1600  interfering substances. A more specific alternate chemical method 1700 must be used in order to obtain a confirmed analytical result.  1800 Gas chromato graphy / mass spectrometry (GC/MS) is the preferred 1900 confirmatory method.     Current Facility-Administered  Medications  Medication Dose Route Frequency Provider Last Rate Last Dose  . acetaminophen (TYLENOL) tablet 325 mg  325 mg Oral Q6H PRN Lavonia Drafts, MD      . ARIPiprazole (ABILIFY) tablet 15 mg  15 mg Oral Q1200 Lavonia Drafts, MD   15 mg at 06/07/16 1226  . asenapine (SAPHRIS) sublingual tablet 10 mg  10 mg Sublingual TID Lavonia Drafts, MD   10 mg at 06/07/16 1642  . benztropine (COGENTIN) tablet 1 mg  1 mg Oral BID Lavonia Drafts, MD   1 mg at 06/07/16 0845  . buPROPion (WELLBUTRIN XL) 24 hr tablet 150 mg  150 mg Oral Daily Lavonia Drafts, MD   150 mg at 06/07/16 0845  . cholecalciferol (VITAMIN D) tablet 1,000 Units  1,000 Units Oral BID Lavonia Drafts, MD   1,000 Units at 06/07/16 [phone]  . divalproex (DEPAKOTE SPRINKLE) capsule 125 mg  125 mg Oral Q12H Lavonia Drafts, MD   125 mg at 06/07/16 0845  . docusate sodium (COLACE) capsule 200 mg  200 mg Oral Daily Lavonia Drafts, MD   200 mg at 06/07/16 0843  . ferrous sulfate tablet 325 mg  325 mg Oral Daily Lavonia Drafts, MD   325 mg at 06/07/16 0846  . hydrochlorothiazide (HYDRODIURIL) tablet 25 mg  25 mg Oral Daily Lavonia Drafts, MD   25 mg at 06/07/16 0841  . lithium carbonate capsule 600 mg  600 mg Oral QHS Lavonia Drafts, MD   600 mg at 06/06/16 2128  . LORazepam (ATIVAN) tablet 0.5 mg  0.5 mg Oral Q8H PRN Lavonia Drafts, MD      . LORazepam (ATIVAN) tablet 1 mg  1 mg Oral BID Lavonia Drafts, MD   1 mg at 06/07/16 0842  . pantoprazole (PROTONIX) EC tablet 40 mg  40 mg Oral Daily Lavonia Drafts, MD   40 mg at 06/07/16 0846  . PARoxetine (PAXIL) tablet 20 mg  20 mg Oral Daily Lavonia Drafts, MD   20 mg at 06/07/16 4765   Current Outpatient Prescriptions  Medication Sig Dispense Refill  . acetaminophen (TYLENOL) 325 MG tablet Take 325 mg by mouth every 6 (six) hours as needed for mild pain, fever or headache.    . ARIPiprazole (ABILIFY) 15 MG tablet Take 15 mg by mouth daily at 12 noon.    . Asenapine Maleate (SAPHRIS) 10 MG SUBL Place 1 tablet under  the tongue 3 (three) times daily.     . benztropine (COGENTIN) 1 MG tablet Take 1 tablet (1 mg total) by mouth 2 (two) times daily. 60 tablet 0  . buPROPion (WELLBUTRIN XL) 150 MG 24 hr tablet Take 150 mg by mouth daily.    . cholecalciferol (VITAMIN D) 1000 UNITS tablet Take 1,000 Units by mouth 2 (two) times daily.    . cloNIDine (CATAPRES - DOSED IN MG/24 HR) 0.3 mg/24hr patch Place 0.3 mg onto the skin once a week. Pt applies on Friday.    . diphenhydrAMINE (BENADRYL) 50 MG capsule Take 1 capsule (50 mg total) by mouth daily. 14 capsule 0  . divalproex (DEPAKOTE SPRINKLE) 125 MG capsule Take 375-750 mg by mouth 2 (two) times daily. 325m in the morning and 7516mat night    . docusate sodium (COLACE)  100 MG capsule Take 200 mg by mouth daily.     . ferrous sulfate 325 (65 FE) MG tablet Take 325 mg by mouth daily.    . hydrochlorothiazide (HYDRODIURIL) 25 MG tablet Take 25 mg by mouth daily.    Marland Kitchen lithium carbonate 300 MG capsule Take 1 capsule (300 mg total) by mouth at bedtime. (Patient taking differently: Take 600 mg by mouth at bedtime. ) 20 capsule 0  . LORazepam (ATIVAN) 0.5 MG tablet Take 0.5 mg by mouth every 8 (eight) hours as needed for anxiety.    Marland Kitchen LORazepam (ATIVAN) 1 MG tablet Take 1 mg by mouth 2 (two) times daily.    . meclizine (ANTIVERT) 25 MG tablet Take 25 mg by mouth 3 (three) times daily as needed for dizziness.    . medroxyPROGESTERone (DEPO-PROVERA) 150 MG/ML injection Inject 150 mg into the muscle every 3 (three) months.    . naltrexone (DEPADE) 50 MG tablet Take 50 mg by mouth daily.    . Olopatadine HCl (PATADAY) 0.2 % SOLN Apply 1 drop to eye daily as needed (for allergies).     Marland Kitchen omeprazole (PRILOSEC) 20 MG capsule Take 20 mg by mouth daily.    Marland Kitchen PARoxetine (PAXIL) 20 MG tablet Take 20 mg by mouth daily.    . polyethylene glycol (MIRALAX / GLYCOLAX) packet Take 17 g by mouth every other day.    Marland Kitchen QUEtiapine (SEROQUEL) 50 MG tablet Take 1 tablet (50 mg total) by  mouth every 6 (six) hours as needed.    . solifenacin (VESICARE) 10 MG tablet Take 10 mg by mouth daily.    . valACYclovir (VALTREX) 1000 MG tablet Take 2,000 mg by mouth 2 (two) times daily as needed (for fever blisters).      Musculoskeletal: Strength & Muscle Tone: within normal limits Gait & Station: broad based Patient leans: N/A  Psychiatric Specialty Exam: Physical Exam  Nursing note and vitals reviewed. Constitutional: She appears well-developed and well-nourished.  HENT:  Head: Normocephalic and atraumatic.  Eyes: Conjunctivae are normal. Pupils are equal, round, and reactive to light.  Neck: Normal range of motion.  Cardiovascular: Regular rhythm and normal heart sounds.   Respiratory: Effort normal.  GI: Soft.  Musculoskeletal: Normal range of motion.  Neurological: She is alert.  Skin: Skin is warm and dry.  Psychiatric: Her affect is labile. Her speech is delayed and slurred. She is withdrawn. Thought content is delusional. She expresses impulsivity. She is noncommunicative. She exhibits abnormal recent memory and abnormal remote memory.    Review of Systems  Constitutional: Negative.   HENT: Negative.   Eyes: Negative.   Respiratory: Negative.   Cardiovascular: Negative.   Gastrointestinal: Negative.   Musculoskeletal: Negative.   Skin: Negative.   Neurological: Negative.   Psychiatric/Behavioral: Negative.     Blood pressure 131/78, pulse 79, temperature 98 F (36.7 C), temperature source Oral, resp. rate 18, height _0  (1.575 m), weight 72.6 kg (160 lb), last menstrual period 05/28/2016, SpO2 99 %.Body mass index is 29.26 kg/m.  General Appearance: Casual  Eye Contact:  Minimal  Speech:  Garbled  Volume:  Decreased  Mood:  Euphoric  Affect:  Constricted  Thought Process:  Disorganized  Orientation:  NA  Thought Content:  Illogical and Tangential  Suicidal Thoughts:  No  Homicidal Thoughts:  No  Memory:  Immediate;   Fair Recent;   Poor Remote;    Poor  Judgement:  Impaired  Insight:  Lacking  Psychomotor Activity:  Decreased  Concentration:  Concentration: Poor  Recall:  [employer] of Knowledge:  Poor  Language:  Poor  Akathisia:  Negative  Handed:  Right  AIMS (if indicated):     Assets:  Financial Resources/Insurance Social Support  ADL's:  Impaired  Cognition:  Impaired,  Moderate  Sleep:        Treatment Plan Summary: Daily contact with patient to assess and evaluate symptoms and progress in treatment, Medication management and Plan 25 year old woman with moderate MR and in [place] chewable behavior problems resulting in inability to be managed by a group home. Case reviewed again today with social work. We have referred her to [facility] as there seems to be no other option for controlling her behavior.  Disposition: Patient does not meet criteria for psychiatric inpatient admission.  Alethia Berthold, MD 06/07/2016 5:33 PM

## 2016-06-07 NOTE — ED Notes (Signed)
ED BHU PLACEMENT JUSTIFICATION Is the patient under IVC or is there intent for IVC: Yes.   Is the patient medically cleared: Yes.   Is there vacancy in the ED BHU: Yes.   Is the population mix appropriate for patient: Yes.   Is the patient awaiting placement in inpatient or outpatient setting:  Group home placement  Has the patient had a psychiatric consult: yes  Survey of unit performed for contraband, proper placement and condition of furniture, tampering with fixtures in bathroom, shower, and each patient room: Yes.  ; Findings:  APPEARANCE/BEHAVIOR Calm and cooperative NEURO ASSESSMENT Orientation: oriented x2   Hallucinations: No.None noted (Hallucinations) Speech: Normal Gait: normal RESPIRATORY ASSESSMENT even unlabored respirations  CARDIOVASCULAR ASSESSMENT Pulses equal  regular rate  Skin warm and dry GASTROINTESTINAL ASSESSMENT no GI complaint EXTREMITIES Full ROM PLAN OF CARE Provide calm/safe environment. Vital signs assessed twice daily. ED BHU Assessment once each 12-hour shift. Collaborate with TTS daily or as condition indicates. Assure the ED provider has rounded once each shift. Provide and encourage hygiene. Provide redirection as needed. Assess for escalating behavior; address immediately and inform ED provider.  Assess family dynamic and appropriateness for visitation as needed: Yes.  ; If necessary, describe findings:  Educate the patient/family about BHU procedures/visitation: Yes.  ; If necessary, describe findings:

## 2016-06-07 NOTE — ED Notes (Signed)
Pt given ginger ale with ice and two packs of graham crackers at this time. No other needs voiced at this time. 

## 2016-06-08 NOTE — ED Notes (Signed)
BEHAVIORAL HEALTH ROUNDING Patient sleeping: No. Patient alert and oriented: yes Behavior appropriate: Yes.  ; If no, describe:  Nutrition and fluids offered: yes Toileting and hygiene offered: Yes  Sitter present: q15 minute observations and security  monitoring Law enforcement present: Yes  ODS  

## 2016-06-08 NOTE — ED Notes (Signed)
NAD noted at this time. Pt resting in bed with blankets pulled up over her head. Will continue to monitor for further patient needs at this time.

## 2016-06-08 NOTE — ED Notes (Signed)
Pt given chocolate ice cream as a snack. 

## 2016-06-08 NOTE — ED Notes (Signed)
IVC/  PENDING  PLACEMENT 

## 2016-06-08 NOTE — ED Notes (Signed)
Pt given dinner tray and drink 

## 2016-06-08 NOTE — ED Notes (Signed)
Pt given breakfast tray

## 2016-06-08 NOTE — ED Notes (Signed)
Pt sitting in the bed at this time. NAD noted. Pt noted to be calm and cooperative at this time. Will continue to monitor for further patient needs.

## 2016-06-08 NOTE — ED Provider Notes (Signed)
-----------------------------------------   6:12 AM on 06/08/2016 -----------------------------------------   Blood pressure 127/63, pulse (!) 108, temperature 98.2 F (36.8 C), temperature source Oral, resp. rate 18, height 5\' 2"  (1.575 m), weight 160 lb (72.6 kg), last menstrual period 05/28/2016, SpO2 100 %.  The patient had no acute events since last update.  Calm and cooperative at this time.  Disposition is pending Psychiatry/Behavioral Medicine team recommendations.     Irean HongJade J Sung, MD 06/08/16 [phone]

## 2016-06-08 NOTE — ED Notes (Addendum)
Pt resting in bed at this time. NAD noted. Pt noted to be calm and cooperative at this time. Pt requesting to know when breakfast would arrive, this RN informed patient that breakfast should be arriving at any time. Pt states to this RN "I miss Sunny SchleinFelicia, my friend, is she working today?" This RN informed patient that Sunny SchleinFelicia was not available at this time, pt states "okay" at this time.

## 2016-06-08 NOTE — BH Assessment (Signed)
[facility] was [facility] and [facility]. Patient was put back on the wait list.

## 2016-06-08 NOTE — ED Notes (Signed)
Pt resting in bed. Noted to have papers and crayons that she pulled from her pocket. Pt states she is going to "write felicia a letter". Pt is calm and cooperative at this time. NAD noted. Will continue to monitor.

## 2016-06-09 DIAGNOSIS — F71 Moderate intellectual disabilities: Secondary | ICD-10-CM | POA: Diagnosis not present

## 2016-06-09 NOTE — ED Notes (Signed)
BEHAVIORAL HEALTH ROUNDING  Patient sleeping: Yes Patient alert and oriented: Sleeping Behavior appropriate: Yes. ; If no, describe:  Nutrition and fluids offered: No, sleeping  Toileting and hygiene offered: No, sleeping  Sitter present: q15 minute observations and security monitoring  Law enforcement present: Yes ODS 

## 2016-06-09 NOTE — Progress Notes (Signed)
Called  [facility] as per Junious Dresseronnie patient  [facility]. They will call us when bed is ready.  Delta Air LinesClaudine Yazen Rosko LCSW [phone]

## 2016-06-09 NOTE — Progress Notes (Signed)
LCSW received a consult from ED Nurse and [facility] Ms  Colan Neptuneempia Foster [phone]. She explained they are working towards getting additional staff supports in place to assist group home provider. It is [facility] and is advocating for this. We both agreed on extensive wait times for [facility].  LCSW discussed  Nathaniel ManMurdoch to see if patient was on their wait list. Ms Colan Neptuneempia Foster stated she is not on any MR waitlist as they have been able to manage fine.   [facility] Care Coordinator will have meeting with her director before she goes on vacation next week to see if additional funds can be increased ( she is currently at a level 4 rate ) so this may be denied. CCC will work in addition for a day program placement.   [employer] Air LinesClaudine Adebayo Ensminger LCSW [phone]

## 2016-06-09 NOTE — ED Notes (Signed)
Pt given meal tray.

## 2016-06-09 NOTE — ED Notes (Signed)
BEHAVIORAL HEALTH ROUNDING Patient sleeping: No. Patient alert and oriented: yes Behavior appropriate: Yes.  ; If no, describe:  Nutrition and fluids offered: yes Toileting and hygiene offered: Yes  Sitter present: q15 minute observations and security  monitoring Law enforcement present: Yes  ODS  

## 2016-06-09 NOTE — ED Notes (Signed)
Pt. Asking to go home and attempting to leave room, pt. Redirected back into room.  Explained to patient, that destructive behavior would not be tolerated in the ER and could hurt chances of placement.  Pt. Apologized and agreed to take her night time medication.  Pt. Given 2 crayons and a piece of paper to draw.

## 2016-06-09 NOTE — ED Notes (Signed)
Hannah Keller had a shower and brushed her teeth due to having a accident in her bed.

## 2016-06-09 NOTE — Consult Note (Signed)
  Psychiatry: Follow-up [age] year old woman with developmental disability and behavior problems. No new complaints. No change to behavior. Has not been aggressive or violent today. She is alert and generally cooperative with care in the emergency room. Discussed the situation again today with social work and TTS. Apparently there is a move being made to try and get her more supervision 1 she is eventually discharged but the consensus from her guardian is that [facility]. She is still on the waiting list. No change to treatment plan.

## 2016-06-09 NOTE — ED Notes (Signed)
Patient is IVC and is pending placement. 

## 2016-06-10 MED ORDER — LORAZEPAM 2 MG/ML IJ SOLN
1.0000 mg | Freq: Once | INTRAMUSCULAR | Status: AC
  Administered 2016-06-10: 1 mg via INTRAMUSCULAR

## 2016-06-10 MED ORDER — LORAZEPAM 2 MG/ML IJ SOLN: 1.0000 mg | Freq: Once | INTRAMUSCULAR | Status: DC

## 2016-06-10 MED ORDER — LORAZEPAM 2 MG/ML IJ SOLN
INTRAMUSCULAR | Status: AC
  Filled 2016-06-10: qty 1

## 2016-06-10 NOTE — Progress Notes (Signed)
LCSW had a visit with Ms Hannah Keller and brief chat she was supported and hopes she can go soon. She reported she like it when she can hug and say hi. LCSW will visit again tommorow and chaeck in.  [employer] Air LinesClaudine Raylei Losurdo LCSW [phone]

## 2016-06-10 NOTE — ED Notes (Signed)
Pt given new behavorial clothing and wet wipes to clean herself up

## 2016-06-10 NOTE — ED Notes (Signed)
Snack provided, sheets changed.

## 2016-06-10 NOTE — ED Notes (Signed)
Pt ambulatory around treatment area without difficulty. Pt states "i want to go home, i'm sad". Pt states is going to draw a picture. resps unlabored, skin normal color warm and dry. Pt denies pain.

## 2016-06-10 NOTE — ED Notes (Signed)
Pt is calm and interacting positively with staff at this time.

## 2016-06-10 NOTE — ED Notes (Signed)
ivc/pending placement.. 

## 2016-06-10 NOTE — ED Notes (Signed)
Pt calmer, writing a 'letter of apology" per security. resps unlabored at this time. Pt sitting in bed.

## 2016-06-10 NOTE — ED Notes (Signed)
Pt states "i want to go home". This rn encouraged pt back to her room. This rn watching tv and coloring with pt. Pt states "thank you" and very positive about interaction with this rn. Pt relates, "bed now, i'll go to bed".

## 2016-06-10 NOTE — ED Notes (Signed)
Dr. Cyril Loosenkinner notified of pt's hr 118, temperature of 100.1. md states "let's see how she is in the morning". Will continue to monitor temp and hr this shift.

## 2016-06-10 NOTE — ED Notes (Signed)
Pt refused to allow this tech to get vital signs, pt keep repeating "I'm upset right now, I want to go home" Rn Sunny Schlein(Felicia) notified

## 2016-06-10 NOTE — ED Notes (Signed)
Pt very agitated, pt striking staff with fists and beating wall with fists. md notified, order for ativan received.

## 2016-06-10 NOTE — ED Notes (Signed)
Pt out of room stating that she is "angry", pt having to be continuously redirected by myself as well as officers.

## 2016-06-10 NOTE — ED Notes (Signed)
Pt running down hall, states "i want to go home." security intercepted pt and pt back to room.

## 2016-06-11 ENCOUNTER — Emergency Department: Payer: Medicaid Other

## 2016-06-11 LAB — URINALYSIS COMPLETE WITH MICROSCOPIC (ARMC ONLY)
Bilirubin Urine: NEGATIVE
GLUCOSE, UA: NEGATIVE mg/dL
KETONES UR: NEGATIVE mg/dL
NITRITE: NEGATIVE
Protein, ur: NEGATIVE mg/dL
SPECIFIC GRAVITY, URINE: 1.017 (ref 1.005–1.030)
pH: 6 (ref 5.0–8.0)

## 2016-06-11 MED ORDER — HALOPERIDOL LACTATE 5 MG/ML IJ SOLN
10.0000 mg | Freq: Once | INTRAMUSCULAR | Status: AC
  Administered 2016-06-11: 10 mg via INTRAMUSCULAR

## 2016-06-11 MED ORDER — LORAZEPAM 2 MG/ML IJ SOLN
1.0000 mg | Freq: Once | INTRAMUSCULAR | Status: AC
  Administered 2016-06-11: 1 mg via INTRAMUSCULAR

## 2016-06-11 MED ORDER — HALOPERIDOL LACTATE 5 MG/ML IJ SOLN
INTRAMUSCULAR | Status: AC
Start: 2016-06-11 — End: 2016-06-11
  Administered 2016-06-11: 2 mg via INTRAMUSCULAR
  Filled 2016-06-11: qty 1

## 2016-06-11 MED ORDER — LORAZEPAM 2 MG/ML IJ SOLN
INTRAMUSCULAR | Status: AC
Start: 2016-06-11 — End: 2016-06-11
  Administered 2016-06-11: 1 mg via INTRAMUSCULAR
  Filled 2016-06-11: qty 1

## 2016-06-11 MED ORDER — HALOPERIDOL LACTATE 5 MG/ML IJ SOLN
INTRAMUSCULAR | Status: AC
  Administered 2016-06-11: 10 mg via INTRAMUSCULAR
  Filled 2016-06-11: qty 2

## 2016-06-11 MED ORDER — LORAZEPAM 2 MG/ML IJ SOLN
INTRAMUSCULAR | Status: AC
  Administered 2016-06-11: 1 mg via INTRAMUSCULAR
  Filled 2016-06-11: qty 1

## 2016-06-11 MED ORDER — HALOPERIDOL LACTATE 5 MG/ML IJ SOLN
2.0000 mg | Freq: Once | INTRAMUSCULAR | Status: AC
  Administered 2016-06-11: 2 mg via INTRAMUSCULAR

## 2016-06-11 NOTE — ED Provider Notes (Signed)
-----------------------------------------   6:27 AM on 06/11/2016 -----------------------------------------   Blood pressure 120/73, pulse (!) 102, temperature 98.9 F (37.2 C), temperature source Oral, resp. rate 18, height 5\' 2"  (1.575 m), weight 72.6 kg, last menstrual period 05/28/2016, SpO2 100 %.  The patient has been running a low-grade fever and having some borderline tachycardia.  We obtained a urinalysis overnight and the results are not conclusive.  I have ordered a urine culture as well as a chest x-ray to rule out the most obvious sources of treatable infection.  We need to follow-up on her chest x-ray.   Loleta Roseory Annet Manukyan, MD 06/11/16 [phone]

## 2016-06-11 NOTE — ED Notes (Signed)
Patient coming out of her room, RN, Kennith Centerracey, EDT, and ODS officer Teague tried to redirect patient back into her room. Patient unable to be redirected. Patient attempting to hit [place], EDT.  Order obtained from Dr. Roxan Hockeyobinson for Haldol 2 mg IM

## 2016-06-11 NOTE — ED Notes (Signed)
Pt taken to decon shower by this tech to shower,hair shampooed and linen changed.

## 2016-06-11 NOTE — ED Notes (Signed)
Patient out of room in hallway, RN and ODS officer tried to redirect patient back to her room, Patient jerked away from staff and sat in the floor in quad. RN, ED tech Gerilyn PilgrimJacob, and ODS officer Teague tried to get patient to get up out of floor and back into her room. Patient unable to be redirected.   RN obtained verbal order from Dr. Roxan Hockeyobinson for Ativan 1 mg IM

## 2016-06-11 NOTE — ED Notes (Signed)
Patient given meal tray.

## 2016-06-11 NOTE — ED Notes (Signed)
Pt took shower under supervision of [place]acy, [place], now hair care by WoodbineAlyssa, NT

## 2016-06-11 NOTE — ED Notes (Signed)
Pt attempting to strike ODS, pt safely restrained by staff, Dr Mayford KnifeWilliams notified, oreders rx'd

## 2016-06-11 NOTE — ED Notes (Signed)
Patient is IVC, pending placement and the patient's IVC papers need to be renewed on 11/13 @ 1953.

## 2016-06-11 NOTE — Progress Notes (Signed)
[facility] There is no beds or d/c today patient remains on waitlist. LCSW and TTS consulted.   Delta Air LinesClaudine Sharonlee Nine LCSW [phone]

## 2016-06-11 NOTE — ED Notes (Signed)
Report to angela, rn. 

## 2016-06-11 NOTE — ED Notes (Signed)
Patient in hallway RN attempted to verbally redirect patient back to her room. Patient states "no, I want to go home." Patient attempted to exit quad area. Patient was physically placed back in her room by Marylene LandAngela, RN and Victorino DikeJennifer, ODS officer.

## 2016-06-12 LAB — URINE CULTURE: Special Requests: NORMAL

## 2016-06-12 MED ORDER — ZIPRASIDONE MESYLATE 20 MG IM SOLR
20.0000 mg | Freq: Once | INTRAMUSCULAR | Status: AC
  Administered 2016-06-12: 20 mg via INTRAMUSCULAR

## 2016-06-12 MED ORDER — ZIPRASIDONE MESYLATE 20 MG IM SOLR
INTRAMUSCULAR | Status: AC
  Administered 2016-06-12: 20 mg via INTRAMUSCULAR
  Filled 2016-06-12: qty 20

## 2016-06-12 NOTE — ED Notes (Signed)
Pt removing clothes, staff and this RN stopped pt, redirected to bed, tucked in with warm blanket

## 2016-06-12 NOTE — ED Notes (Signed)
Pt vehemently refusing reorientation, calming or redirection methods, and scheduled meds

## 2016-06-12 NOTE — ED Notes (Addendum)
Pt alert, continues request to go home, will attempt to walk out if given opportunity, again refusing meds, punching bed, frustrated, redirecting to to "being good" having marginal effect

## 2016-06-12 NOTE — ED Notes (Addendum)
Pt crying and upset, reports feeling "scared" of other patient (RM 23). PRN offered & administered.

## 2016-06-12 NOTE — ED Notes (Signed)
Continual efforts to redirect pt to bed, distract with coloring, or snack ineffective for more than 5 mins, pt requiring continuous supervision by staff or ODS to deescalate.   Pt refusing to take meds

## 2016-06-12 NOTE — ED Provider Notes (Signed)
-----------------------------------------   4:25 AM on 06/12/2016 -----------------------------------------   Blood pressure 123/76, pulse (!) 103, temperature 98.7 F (37.1 C), temperature source Oral, resp. rate 16, height 5\' 2"  (1.575 m), weight 72.6 kg, last menstrual period 05/28/2016, SpO2 96 %.  Dg Chest 2 View  Result Date: 06/11/2016 CLINICAL DATA:  Low-grade fever and tachycardia. EXAM: CHEST  2 VIEW COMPARISON:  April 03, 2015 FINDINGS: The heart size and mediastinal contours are within normal limits. Both lungs are clear. The visualized skeletal structures are unremarkable. IMPRESSION: No active cardiopulmonary disease. Electronically Signed   By: Gerome Samavid  Williams III M.D   On: 06/11/2016 07:38    Vitals have been stable.  However, the patient has been acting out during the afternoon and night shifts.  She required calming agents during the afternoon, and after my shift started at 11:00pm she was still acting out, refusing meds, and becoming increasingly agitated.  I ordered Geodon 20 mg IM to prevent her from harming herself or others.  She is currently resting quietly.    Loleta Roseory Laisha Rau, MD 06/12/16 [phone]

## 2016-06-12 NOTE — ED Notes (Signed)
pt uncooperative, attempts to leave room if left alone, refusing meds, ODS at doorway

## 2016-06-12 NOTE — ED Notes (Signed)
Pt picked up chair in room and attempted to walk it into ODS officer McAdoo, ODS removed chair from pt, Dr York CeriseForbach notified orders recieved

## 2016-06-12 NOTE — ED Notes (Signed)
Pt sleeping soundly, NAD noted

## 2016-06-12 NOTE — ED Notes (Signed)
pt getting hair braided by Alissa, NT, pt intercacting with staff and happy with attention

## 2016-06-12 NOTE — ED Notes (Addendum)
Pt offered shot and agreed to med, then pt in rm 23 started yelling, with both doors closed, pt Hannah Keller began to startle, appeared frightened and began to yell.  Scheduled meds with apple sauce declined IM Geodon given.  Pt tucked in.  Pt responding to all stimuli in the quad good or bad.  Of particular note pt is now cussing and yelling with regularity - pt needs more positive environment.  Pt unable to orient to consequences of aggressive behavior lengthening her stay.  Pt's frustration at being in ED increasing over the weekend and behavior more difficult to manage.

## 2016-06-12 NOTE — ED Notes (Signed)
Pt was given a snack tray per Rn, pt calm and cooperative at this time. Pt seen in bed resting, nothing needed from staff at this time

## 2016-06-13 NOTE — ED Notes (Signed)
BEHAVIORAL HEALTH ROUNDING Patient sleeping: No. Patient alert and oriented: oriented to self and place Behavior appropriate: Yes.  ; If no, describe:  Nutrition and fluids offered: yes Toileting and hygiene offered: Yes  Sitter present: q15 minute observations and security  monitoring Law enforcement present: Yes  ODS  

## 2016-06-13 NOTE — ED Notes (Signed)
Breakfast was given to patient. 

## 2016-06-13 NOTE — ED Notes (Signed)
Pt observed with no unusual behavior  Appropriate to stimulation  No verbalized needs or concerns at this time  NAD assessed  Continue to monitor 

## 2016-06-13 NOTE — ED Notes (Signed)
Patient in shower 

## 2016-06-13 NOTE — ED Provider Notes (Signed)
-----------------------------------------   6:35 AM on 06/13/2016 -----------------------------------------   Blood pressure 114/81, pulse 99, temperature 98 F (36.7 C), temperature source Oral, resp. rate 16, height 5\' 2"  (1.575 m), weight 160 lb (72.6 kg), last menstrual period 05/28/2016, SpO2 100 %.  The patient had no acute events since last update.  Calm and cooperative at this time. [facility]     Rebecka ApleyAllison P Webster, MD 06/13/16 [phone]

## 2016-06-13 NOTE — ED Notes (Signed)
BEHAVIORAL HEALTH ROUNDING Patient sleeping: No. Patient alert and oriented: yes Behavior appropriate: Yes.  ; If no, describe:  Nutrition and fluids offered: yes Toileting and hygiene offered: Yes  Sitter present: q15 minute observations and security  monitoring Law enforcement present: Yes  ODS  

## 2016-06-13 NOTE — ED Notes (Signed)
BEHAVIORAL HEALTH ROUNDING Patient sleeping: No. Patient alert and oriented:   Oriented to self and place  Behavior appropriate: Yes.  ; If no, describe:  Nutrition and fluids offered: yes Toileting and hygiene offered: Yes  Sitter present: q15 minute observations and security monitoring Law enforcement present: Yes  ODS  

## 2016-06-13 NOTE — ED Notes (Signed)
ED BHU PLACEMENT JUSTIFICATION Is the patient under IVC or is there intent for IVC: Yes.   Is the patient medically cleared: Yes.   Is there vacancy in the ED BHU: Yes.   Is the population mix appropriate for patient: Yes.   Is the patient awaiting placement in inpatient or outpatient setting: Yes.  Awaiting group home placement  Has the patient had a psychiatric consult: Yes.   Survey of unit performed for contraband, proper placement and condition of furniture, tampering with fixtures in bathroom, shower, and each patient room: Yes.  ; Findings:  APPEARANCE/BEHAVIOR Calm and cooperative NEURO ASSESSMENT Orientation: oriented to self and place  Denies pain Hallucinations: No.None noted (Hallucinations) Speech: Normal Gait: normal RESPIRATORY ASSESSMENT Even  Unlabored respirations  CARDIOVASCULAR ASSESSMENT Pulses equal   regular rate  Skin warm and dry   GASTROINTESTINAL ASSESSMENT no GI complaint EXTREMITIES Full ROM  PLAN OF CARE Provide calm/safe environment. Vital signs assessed twice daily. ED BHU Assessment once each 12-hour shift. Collaborate with TTS  daily or as condition indicates. Assure the ED provider has rounded once each shift. Provide and encourage hygiene. Provide redirection as needed. Assess for escalating behavior; address immediately and inform ED provider.  Assess family dynamic and appropriateness for visitation as needed: Yes.  ; If necessary, describe findings:  Educate the patient/family about BHU procedures/visitation: Yes.  ; If necessary, describe findings:

## 2016-06-13 NOTE — ED Notes (Signed)
BEHAVIORAL HEALTH ROUNDING Patient sleeping: No. Patient alert and oriented:   Oriented to self and place   Behavior appropriate: Yes.  ; If no, describe:  Nutrition and fluids offered: yes Toileting and hygiene offered: Yes  Sitter present: q15 minute observations and security monitoring Law enforcement present: Yes  ODS   ENVIRONMENTAL ASSESSMENT Potentially harmful objects out of patient reach: Yes.   Personal belongings secured: Yes.   Patient dressed in hospital provided attire only: Yes.   Plastic bags out of patient reach: Yes.   Patient care equipment (cords, cables, call bells, lines, and drains) shortened, removed, or accounted for: Yes.   Equipment and supplies removed from bottom of stretcher: Yes.   Potentially toxic materials out of patient reach: Yes.   Sharps container removed or out of patient reach: Yes.

## 2016-06-13 NOTE — ED Notes (Signed)
Meal provided 

## 2016-06-13 NOTE — BH Assessment (Signed)
Writer called and confirmed([phone]) [facility].

## 2016-06-13 NOTE — ED Notes (Signed)
BEHAVIORAL HEALTH ROUNDING Patient sleeping: No. Patient alert and oriented: yes oriented to self and place  Behavior appropriate: Yes.  ; If no, describe:  Nutrition and fluids offered: yes Toileting and hygiene offered: Yes  Sitter present: q15 minute observations and security monitoring Law enforcement present: Yes  ODS

## 2016-06-13 NOTE — ED Notes (Signed)
Pt up to the bathroom. Ambulated back to room and went to sleep again.

## 2016-06-14 NOTE — ED Provider Notes (Signed)
-----------------------------------------   3:26 AM on 06/14/2016 -----------------------------------------   Blood pressure 124/67, pulse 97, temperature 98.4 F (36.9 C), temperature source Oral, resp. rate 16, height 5\' 2"  (1.575 m), weight 160 lb (72.6 kg), last menstrual period 05/28/2016, SpO2 95 %.  The patient had no acute events since last update.  Calm and cooperative at this time.  Disposition is pending Psychiatry/Behavioral Medicine team recommendations.     Myrna Blazeravid Matthew Schaevitz, MD 06/14/16 [phone]

## 2016-06-14 NOTE — Progress Notes (Signed)
CH made a follow-up with the Pt [facility]. Pt told Ch, she was afraid and scared of the Pt in Rm 23, whom she claimed the whole night. Pt was also upset that the Tech Nurse left, she wanted to have Tech Nurse, and also see her mothers. CH consoled the Pt, and assured her that she was safe. [facility] was [facility], then [facility].     06/14/16 1500  Clinical Encounter Type  Visited With Patient  Visit Type Initial;Follow-up;Spiritual support  Referral From Chaplain  Consult/Referral To Chaplain  Spiritual Encounters  Spiritual Needs Prayer;Emotional

## 2016-06-14 NOTE — ED Notes (Signed)
PT  IVC/  ON  [facility]  WAITLIST 

## 2016-06-14 NOTE — BH Assessment (Signed)
Writer called and confirmed([phone]) patient [facility].

## 2016-06-14 NOTE — ED Notes (Signed)
Introduced self to pt - she was tearful and stating that she missed her mother - stayed with pt and let her verbalize her feelings about missing her mother and then helped her to organize her bed and her drawings/coloring book/food - pt seemed relieved that things were straight and not tearful after talking with her

## 2016-06-14 NOTE — ED Notes (Signed)
Pt agitated with ED tech Sunny SchleinFelicia because she would not give her her phone number and requested to talk to this nurse about it - pt voiced complaints and was allowed to verbalize frustration - pt given bedtime snack - pt appears calmer at this time

## 2016-06-14 NOTE — ED Provider Notes (Signed)
Called to room for pain in the left upper lip on the buccal mucosa. I do not see any abnormalities including skin breakdown, abscess ulcer, dental abnormality such as dental abscess. The patient is eating and drinking without any discomfort. We'll continue to monitor the patient has pain, but there is no action required at this time.   Rockne MenghiniAnne-Caroline Nyia Tsao, MD 06/14/16 [phone]

## 2016-06-15 NOTE — ED Notes (Signed)
Pt ate 100% of lunch

## 2016-06-15 NOTE — ED Notes (Signed)
Pt given snack crackers and chocolate milk.

## 2016-06-15 NOTE — BH Assessment (Signed)
Writer called and confirmed([phone]) [facility]

## 2016-06-15 NOTE — ED Notes (Signed)
Pt resting in bed, pt given breakfast tray, pt awake and alert

## 2016-06-15 NOTE — ED Notes (Signed)
Patient in shower 

## 2016-06-15 NOTE — ED Notes (Signed)
Pt resting in bed, pt coloring and listening to music, pt awake and alert in no acute distress

## 2016-06-15 NOTE — ED Notes (Signed)
Pt ate 100% of breakfast.

## 2016-06-15 NOTE — ED Notes (Addendum)
ED BHU PLACEMENT JUSTIFICATION Is the patient under IVC or is there intent for IVC: Yes.   Is the patient medically cleared: Yes.   Is there vacancy in the ED BHU: Yes.   Is the population mix appropriate for patient: No. Is the patient awaiting placement in inpatient or outpatient setting: Yes.  [facility] Has the patient had a psychiatric consult: Yes.   Survey of unit performed for contraband, proper placement and condition of furniture, tampering with fixtures in bathroom, shower, and each patient room: Yes.  ; Findings: none APPEARANCE/BEHAVIOR calm, cooperative and adequate rapport can be established NEURO ASSESSMENT Orientation: place and person Hallucinations: No.None noted (Hallucinations) Speech: Volume:soft Gait: WNL RESPIRATORY ASSESSMENT Normal expansion.  Clear to auscultation.  No rales, rhonchi, or wheezing. CARDIOVASCULAR ASSESSMENT regular rate and rhythm, S1, S2 normal, no murmur, click, rub or gallop GASTROINTESTINAL ASSESSMENT soft, nontender, BS WNL, no r/g EXTREMITIES normal strength, tone, and muscle mass, no deformities, no erythema, induration, or nodules, no evidence of joint effusion, ROM of all joints is normal, no evidence of joint instability. At baseline. PLAN OF CARE Provide calm/safe environment. Vital signs assessed three times day. ED QUAD Assessment once each 12-hour shift. Collaborate with intake RN daily or as condition indicates. Assure the ED provider has rounded once each shift. Provide and encourage hygiene. Provide redirection as needed. Assess for escalating behavior; address immediately and inform ED provider.

## 2016-06-15 NOTE — ED Provider Notes (Signed)
-----------------------------------------   7:13 AM on 06/15/2016 -----------------------------------------   Blood pressure 112/86, pulse (!) 112, temperature 98.5 F (36.9 C), temperature source Oral, resp. rate 18, height 5\' 2"  (1.575 m), weight 160 lb (72.6 kg), last menstrual period 05/28/2016, SpO2 99 %.  The patient had no acute events since last update.  Calm and cooperative at this time.  Disposition is pending Psychiatry/Behavioral Medicine team recommendations.     Myrna Blazeravid Matthew Hagen Bohorquez, MD 06/15/16 [phone]

## 2016-06-15 NOTE — ED Notes (Signed)
Pt resting in bed, eyes closed, resp even and unlabored

## 2016-06-15 NOTE — ED Notes (Signed)
Pt resting in bed, pt coloring, pt awaiting lunch

## 2016-06-15 NOTE — ED Notes (Signed)
Pt resting in bed, coloring on paper, pt awake and alert in no acute distress

## 2016-06-16 DIAGNOSIS — F71 Moderate intellectual disabilities: Secondary | ICD-10-CM | POA: Diagnosis not present

## 2016-06-16 NOTE — ED Notes (Signed)
Pt ambulated to the bathroom and back to bed  

## 2016-06-16 NOTE — ED Notes (Signed)
BEHAVIORAL HEALTH ROUNDING Patient sleeping: No. Patient alert and oriented: yes Behavior appropriate: Yes.  ; If no, describe:  Nutrition and fluids offered: No Toileting and hygiene offered: Yes  Sitter present: not applicable Law enforcement present: Yes  

## 2016-06-16 NOTE — BH Assessment (Signed)
Writer called and confirmed([phone]) [facility]

## 2016-06-16 NOTE — ED Notes (Signed)
Breakfast was given to patient. 

## 2016-06-16 NOTE — ED Notes (Signed)
BEHAVIORAL HEALTH ROUNDING Patient sleeping: No. Patient alert and oriented: yes Behavior appropriate: Yes.  ; If no, describe:  Nutrition and fluids offered: Yes  Toileting and hygiene offered: Yes  Sitter present: not applicable Law enforcement present: Yes  

## 2016-06-16 NOTE — Consult Note (Signed)
  Psychiatry: Follow-up for this 25 year old woman. Patient interviewed. Chart reviewed. Patient's behavior is adequate. She is easily distracted and redirected. Has not been acutely violent in the last couple days. She is compliant with medicine. Medically he appears to be stable.  Patient has no specific complaints. Affect euthymic. Behavior adequate for now. Obviously still very cognitively impaired.  No change to treatment plan. We are still looking for appropriate disposition and whether inpatient hospitalization at a long-term hospital facility or to find some kind of appropriate living facility for her. Change to medicine

## 2016-06-16 NOTE — ED Notes (Signed)

## 2016-06-16 NOTE — ED Provider Notes (Signed)
-----------------------------------------   7:22 AM on 06/16/2016 -----------------------------------------   Blood pressure 120/71, pulse (!) 103, temperature 98.2 F (36.8 C), temperature source Oral, resp. rate 16, height 5\' 2"  (1.575 m), weight 160 lb (72.6 kg), last menstrual period 05/28/2016, SpO2 98 %.  The patient had no acute events since last update.  Calm and cooperative at this time.  Disposition is pending Psychiatry/Behavioral Medicine team recommendations.     Irean HongJade J Kjersti Dittmer, MD 06/16/16 [phone]

## 2016-06-16 NOTE — ED Notes (Signed)
Pt alert, oriented wnl. Calm and cooperative. Denies self harm thoughts or actions. No current issues or needs. Tech and/or  RN doing 15 min safety checks. Officer on hall.

## 2016-06-16 NOTE — ED Notes (Signed)
BEHAVIORAL HEALTH ROUNDING Patient sleeping: Yes.   Patient alert and oriented: not applicable Behavior appropriate: Yes.  ; If no, describe:  Nutrition and fluids offered: no Toileting and hygiene offered: No Sitter present: not applicable Law enforcement present: Yes

## 2016-06-16 NOTE — ED Notes (Signed)
Patient in shower 

## 2016-06-16 NOTE — ED Notes (Signed)
Pt asking for something to drink. Pt informed that she could only have water at this time and she is ok with that. Pt given cup of water.

## 2016-06-16 NOTE — Progress Notes (Signed)
LCSW called [facility]- No beds yet patient remains on waitlist.  Arrie Senatelaudine Soraya Paquette LCSW [phone]

## 2016-06-17 NOTE — ED Notes (Signed)
[facility]

## 2016-06-17 NOTE — ED Notes (Signed)
Pt resting in bed, resp even and unlabored, pt coloring with crayons

## 2016-06-17 NOTE — Progress Notes (Signed)
LCSW called [facility] and spoke to Jenine-Patient remains on waitlist.  Arrie Senatelaudine Ashvin Adelson LCSW [phone]

## 2016-06-17 NOTE — ED Notes (Signed)
Pt resting in bed, resp even and unlabored, eyes closed 

## 2016-06-17 NOTE — ED Notes (Signed)
Pt resting in bed, pt eating lunch, pt listening to music, pt awake and alert

## 2016-06-17 NOTE — ED Notes (Signed)
PT IVC/REMAINS ON WAITLIST AT [facility]. 

## 2016-06-17 NOTE — ED Notes (Signed)
Pt urinated small amount in clothes, Bri, NT changed pt's clothes and this RN changed bed linens

## 2016-06-17 NOTE — ED Notes (Signed)
Pt resting in bed with eyes closed, lights dimmed in room, resp even and unlabored

## 2016-06-17 NOTE — ED Notes (Signed)
[facility]

## 2016-06-17 NOTE — ED Notes (Signed)
BEHAVIORAL HEALTH ROUNDING  Patient sleeping: No.  Patient alert and oriented: yes  Behavior appropriate: Yes. ; If no, describe:  Nutrition and fluids offered: Yes  Toileting and hygiene offered: Yes  Sitter present: not applicable, Q 15 min safety rounds and observation.  Law enforcement present: Yes ODS  

## 2016-06-17 NOTE — BH Assessment (Signed)
This clinician called to [facility].

## 2016-06-17 NOTE — ED Notes (Signed)
Pt resting in bed, eyes closed, resp even and unlabored

## 2016-06-17 NOTE — ED Notes (Signed)
Pt awake, given breakfast tray.

## 2016-06-17 NOTE — ED Notes (Signed)
ENVIRONMENTAL ASSESSMENT  Potentially harmful objects out of patient reach: Yes.  Personal belongings secured: Yes.  Patient dressed in hospital provided attire only: Yes.  Plastic bags out of patient reach: Yes.  Patient care equipment (cords, cables, call bells, lines, and drains) shortened, removed, or accounted for: Yes.  Equipment and supplies removed from bottom of stretcher: Yes.  Potentially toxic materials out of patient reach: Yes.  Sharps container removed or out of patient reach: Yes.   BEHAVIORAL HEALTH ROUNDING  Patient sleeping: No.  Patient alert and oriented: yes  Behavior appropriate: Yes. ; If no, describe:  Nutrition and fluids offered: Yes  Toileting and hygiene offered: Yes  Sitter present: not applicable, Q 15 min safety rounds and observation.  Law enforcement present: Yes ODS  ED BHU PLACEMENT JUSTIFICATION  Is the patient under IVC or is there intent for IVC: Yes.  Is the patient medically cleared: Yes.  Is there vacancy in the ED BHU: Yes.  Is the population mix appropriate for patient: No Is the patient awaiting placement in inpatient or outpatient setting: Yes. [facility] waitlist. Has the patient had a psychiatric consult: Yes.  Survey of unit performed for contraband, proper placement and condition of furniture, tampering with fixtures in bathroom, shower, and each patient room: Yes. ; Findings: All clear  APPEARANCE/BEHAVIOR  calm, cooperative and adequate rapport can be established  NEURO ASSESSMENT  Orientation: time, place and person  Hallucinations: No.None noted (Hallucinations)  Speech: Normal  Gait: normal  RESPIRATORY ASSESSMENT  WNL  CARDIOVASCULAR ASSESSMENT  WNL  GASTROINTESTINAL ASSESSMENT  WNL  EXTREMITIES  WNL  PLAN OF CARE  Provide calm/safe environment. Vital signs assessed TID. ED BHU Assessment once each 12-hour shift. Collaborate with TTS daily or as condition indicates. Assure the ED provider has rounded once each shift. Provide  and encourage hygiene. Provide redirection as needed. Assess for escalating behavior; address immediately and inform ED provider.  Assess family dynamic and appropriateness for visitation as needed: Yes. ; If necessary, describe findings:  Educate the patient/family about BHU procedures/visitation: Yes. ; If necessary, describe findings: Pt is calm and cooperative at this time. Pt understanding and accepting of unit procedures/rules. Will continue to monitor with Q 15 min safety rounds and observation.

## 2016-06-18 NOTE — Progress Notes (Signed)
LCSW called [facility] today and [name] and patient remains on [facility].   Hannah Senatelaudine Franchon Ketterman LCSW [phone]

## 2016-06-18 NOTE — ED Notes (Signed)
BEHAVIORAL HEALTH ROUNDING  Patient sleeping: No.  Patient alert and oriented: yes  Behavior appropriate: Yes. ; If no, describe:  Nutrition and fluids offered: Yes  Toileting and hygiene offered: Yes  Sitter present: not applicable, Q 15 min safety rounds and observation.  Law enforcement present: Yes ODS  

## 2016-06-18 NOTE — ED Provider Notes (Signed)
-----------------------------------------   7:12 AM on 06/18/2016 -----------------------------------------   Blood pressure 140/86, pulse 91, temperature 98.7 F (37.1 C), temperature source Oral, resp. rate 18, height 5\' 2"  (1.575 m), weight 160 lb (72.6 kg), last menstrual period 05/28/2016, SpO2 99 %.  The patient had no acute events since last update.  Calm and cooperative at this time.  Disposition is pending Psychiatry/Behavioral Medicine team recommendations.     Myrna Blazeravid Matthew Nayab Aten, MD 06/18/16 [phone]

## 2016-06-18 NOTE — ED Notes (Addendum)
BEHAVIORAL HEALTH ROUNDING Patient sleeping: No. Patient alert and oriented:   Oriented to self and place   Behavior appropriate: Yes.  ; If no, describe:  Nutrition and fluids offered: yes Toileting and hygiene offered: Yes  Sitter present: q15 minute observations and security monitoring Law enforcement present: Yes  ODS   ENVIRONMENTAL ASSESSMENT Potentially harmful objects out of patient reach: Yes.   Personal belongings secured: Yes.   Patient dressed in hospital provided attire only: Yes.   Plastic bags out of patient reach: Yes.   Patient care equipment (cords, cables, call bells, lines, and drains) shortened, removed, or accounted for: Yes.   Equipment and supplies removed from bottom of stretcher: Yes.   Potentially toxic materials out of patient reach: Yes.   Sharps container removed or out of patient reach: Yes.   

## 2016-06-18 NOTE — ED Notes (Signed)
BEHAVIORAL HEALTH ROUNDING Patient sleeping: No. Patient alert and oriented:   Oriented to self and place  Behavior appropriate: Yes.  ; If no, describe:  Nutrition and fluids offered: yes Toileting and hygiene offered: Yes  Sitter present: q15 minute observations and security monitoring Law enforcement present: Yes  ODS  

## 2016-06-18 NOTE — ED Notes (Signed)
BEHAVIORAL HEALTH ROUNDING Patient sleeping: No. Patient alert and oriented: oriented to self and place Behavior appropriate: Yes.  ; If no, describe:  Nutrition and fluids offered: yes Toileting and hygiene offered: Yes  Sitter present: q15 minute observations and security  monitoring Law enforcement present: Yes  ODS  

## 2016-06-18 NOTE — ED Notes (Signed)
ENVIRONMENTAL ASSESSMENT  Potentially harmful objects out of patient reach: Yes.  Personal belongings secured: Yes.  Patient dressed in hospital provided attire only: Yes.  Plastic bags out of patient reach: Yes.  Patient care equipment (cords, cables, call bells, lines, and drains) shortened, removed, or accounted for: Yes.  Equipment and supplies removed from bottom of stretcher: Yes.  Potentially toxic materials out of patient reach: Yes.  Sharps container removed or out of patient reach: Yes.  BEHAVIORAL HEALTH ROUNDING  Patient sleeping: No.  Patient alert and oriented: yes  Behavior appropriate: Yes. ; If no, describe:  Nutrition and fluids offered: Yes  Toileting and hygiene offered: Yes  Sitter present: not applicable, Q 15 min safety rounds and observation.  Law enforcement present: Yes ODS  ED BHU PLACEMENT JUSTIFICATION  Is the patient under IVC or is there intent for IVC: Yes.  Is the patient medically cleared: Yes.  Is there vacancy in the ED BHU: Yes.  Is the population mix appropriate for patient: No  Is the patient awaiting placement in inpatient or outpatient setting: Yes.  Has the patient had a psychiatric consult: Yes.  Survey of unit performed for contraband, proper placement and condition of furniture, tampering with fixtures in bathroom, shower, and each patient room: Yes. ; Findings: All clear  APPEARANCE/BEHAVIOR  calm, cooperative and adequate rapport can be established  NEURO ASSESSMENT  Orientation: time, place and person at her baseline Hallucinations: No.None noted (Hallucinations)  Speech: Normal  Gait: normal  RESPIRATORY ASSESSMENT  WNL  CARDIOVASCULAR ASSESSMENT  WNL  GASTROINTESTINAL ASSESSMENT  WNL  EXTREMITIES  WNL  PLAN OF CARE  Provide calm/safe environment. Vital signs assessed TID. ED BHU Assessment once each 12-hour shift. Collaborate with TTS daily or as condition indicates. Assure the ED provider has rounded once each shift.  Provide and encourage hygiene. Provide redirection as needed. Assess for escalating behavior; address immediately and inform ED provider.  Assess family dynamic and appropriateness for visitation as needed: Yes. ; If necessary, describe findings:  Educate the patient/family about BHU procedures/visitation: Yes. ; If necessary, describe findings: Pt is calm and cooperative at this time. Pt understanding and accepting of unit procedures/rules. Will continue to monitor with Q 15 min safety rounds and observation.

## 2016-06-18 NOTE — ED Notes (Signed)
Coloring pages provided

## 2016-06-18 NOTE — ED Notes (Signed)
Pt observed with no unusual behavior  Sitting up in bed with her TV on  Appropriate to stimulation  No verbalized needs or concerns at this time  NAD assessed  Continue to monitor

## 2016-06-18 NOTE — ED Notes (Signed)
BEHAVIORAL HEALTH ROUNDING Patient sleeping: Yes.   Patient alert and oriented: not applicable SLEEPING Behavior appropriate: Yes.  ; If no, describe: SLEEPING Nutrition and fluids offered: No SLEEPING Toileting and hygiene offered: NoSLEEPING Sitter present: not applicable, Q 15 min safety rounds and observation. Law enforcement present: Yes ODS 

## 2016-06-18 NOTE — ED Provider Notes (Deleted)
-----------------------------------------   7:31 AM on 06/18/2016 -----------------------------------------   Blood pressure 140/86, pulse 91, temperature 98.7 F (37.1 C), temperature source Oral, resp. rate 18, height 5\' 2"  (1.575 m), weight 72.6 kg, last menstrual period 05/28/2016, SpO2 99 %.  The patient had no acute events since last update.  Calm and cooperative at this time.  Pending placement   Loleta Roseory Teruko Joswick, MD 06/18/16 [phone]

## 2016-06-18 NOTE — ED Notes (Signed)
Am meds administered as ordered - whole in applesauce  Assessment completed  She has taken her shower and linens have been changed

## 2016-06-18 NOTE — ED Notes (Signed)
ED BHU PLACEMENT JUSTIFICATION Is the patient under IVC or is there intent for IVC: Yes.   Is the patient medically cleared: Yes.   Is there vacancy in the ED BHU: Yes.   Is the population mix appropriate for patient:  MR Is the patient awaiting placement in inpatient or outpatient setting: Yes.   Group home placement  Has the patient had a psychiatric consult: Yes.   Survey of unit performed for contraband, proper placement and condition of furniture, tampering with fixtures in bathroom, shower, and each patient room: Yes.  ; Findings:  APPEARANCE/BEHAVIOR Calm and cooperative NEURO ASSESSMENT Orientation: oriented to self and place  Denies pain Hallucinations: No.None noted (Hallucinations) Speech: Normal Gait: normal RESPIRATORY ASSESSMENT Even  Unlabored respirations  CARDIOVASCULAR ASSESSMENT Pulses equal   regular rate  Skin warm and dry   GASTROINTESTINAL ASSESSMENT no GI complaint EXTREMITIES Full ROM  PLAN OF CARE Provide calm/safe environment. Vital signs assessed twice daily. ED BHU Assessment once each 12-hour shift. Collaborate with TTS daily or as condition indicates. Assure the ED provider has rounded once each shift. Provide and encourage hygiene. Provide redirection as needed. Assess for escalating behavior; address immediately and inform ED provider.  Assess family dynamic and appropriateness for visitation as needed: Yes.  ; If necessary, describe findings:  Educate the patient/family about BHU procedures/visitation: Yes.  ; If necessary, describe findings:
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
## 2016-06-19 NOTE — ED Notes (Signed)
BEHAVIORAL HEALTH ROUNDING Patient sleeping: Yes.   Patient alert and oriented: not applicable SLEEPING Behavior appropriate: Yes.  ; If no, describe: SLEEPING Nutrition and fluids offered: No SLEEPING Toileting and hygiene offered: NoSLEEPING Sitter present: not applicable, Q 15 min safety rounds and observation. Law enforcement present: Yes ODS 

## 2016-06-19 NOTE — ED Notes (Signed)
Pt given lunch tray.

## 2016-06-19 NOTE — ED Provider Notes (Signed)
-----------------------------------------   7:08 AM on 06/19/2016 -----------------------------------------   Blood pressure 113/76, pulse 100, temperature 98.2 F (36.8 C), temperature source Oral, resp. rate 18, height 5\' 2"  (1.575 m), weight 160 lb (72.6 kg), last menstrual period 05/28/2016, SpO2 99 %.  The patient had no acute events since last update.  Calm and cooperative at this time.  [facility] and is awaiting [facility].     Rebecka ApleyAllison P Jarett Dralle, MD 06/19/16 [phone]

## 2016-06-19 NOTE — ED Notes (Signed)
Pt given meal tray and sprite. 

## 2016-06-19 NOTE — ED Notes (Signed)
Pt bp elevated.Blood pressure was taken three more times after initial bp was taken to confirm it was elevated. Nellie RN made aware of these findings.

## 2016-06-19 NOTE — ED Provider Notes (Signed)
-----------------------------------------   7:13 AM on 06/19/2016 -----------------------------------------   Blood pressure 113/76, pulse 100, temperature 98.2 F (36.8 C), temperature source Oral, resp. rate 18, height 5\' 2"  (1.575 m), weight 160 lb (72.6 kg), last menstrual period 05/28/2016, SpO2 99 %.  The patient had no acute events since last update.  Calm and cooperative at this time.  Disposition is pending Psychiatry/Behavioral Medicine team recommendations.     Arnaldo NatalPaul F Malinda, MD 06/19/16 [phone]

## 2016-06-19 NOTE — ED Notes (Signed)
Patient was taken to the BHU to shower.

## 2016-06-19 NOTE — ED Notes (Signed)
Spoke w/ Clapacs regarding group home owner Hannah Keller.  Clapacs stated that group home would be appropriate.

## 2016-06-19 NOTE — ED Notes (Addendum)
Spoke w/ Maureen RalphsVivian, group home owner, and informed her that [facility].  Maureen RalphsVivian stated that she had thought about taking pt back, "time to give her another chance", siting her new psychiatrist and medications changes as positive.  Maureen RalphsVivian stated: "I'll try her, cause she have it in her eyes that she has fight in her and last time I visited her I didn't see that".  She did not want pt taken off [facility] waiting list, and that she'd "try her" but "if she starts acting out I can always bring her back there".  Informed Maureen RalphsVivian that I would relay information to Child psychotherapistsocial worker.

## 2016-06-19 NOTE — ED Notes (Signed)
Pt up to bathroom at this time

## 2016-06-19 NOTE — ED Notes (Signed)
Pt to bathroom at this time

## 2016-06-19 NOTE — ED Notes (Signed)
Pt  ivc  (3rd set)  Pending  placement

## 2016-06-19 NOTE — ED Notes (Signed)
Resumed care from nellie rn.  Pt sleeping.

## 2016-06-19 NOTE — ED Notes (Signed)
Spoke w/ Larita FifeLynn, Child psychotherapistsocial worker.  She informed me that if pt was discharged, then process would begin again and that [facility].  This RN will speak to Clapacs per socia; worker request to form care plan.

## 2016-06-19 NOTE — ED Notes (Signed)
Pt given coke by this tech in Steroform cup

## 2016-06-19 NOTE — ED Notes (Signed)
Maureen RalphsVivian at [phone] called, no answer.  This RN left message requesting call back regarding pts disposition.

## 2016-06-19 NOTE — ED Notes (Signed)
Pt to bathroom

## 2016-06-19 NOTE — ED Notes (Signed)
Pt given graham crackers,peanut butter and a sprite at this time.

## 2016-06-19 NOTE — ED Notes (Signed)
Pt sleeping. 

## 2016-06-19 NOTE — ED Notes (Addendum)
Spoke w/ Maureen RalphsVivian from pts former group home, she stated that she would have pt return.  She stated that she would be unable to pick pt up today, stated that she needed to speak w/ pts guardian and "team".  Stated that more safety measures would need to be in place before pt could return.  She estimates by tomorrow, pt informed.  [place] made aware.

## 2016-06-20 MED ORDER — ARIPIPRAZOLE 5 MG PO TABS
ORAL_TABLET | ORAL | Status: AC
  Filled 2016-06-20: qty 1

## 2016-06-20 NOTE — ED Notes (Addendum)
Pt alert, oriented at her baseline, calm and cooperative. oob without assistance. NAD. Denies pain. Interacting with staff. Excited and talking about leaving today, " I'm going home today". No complaints at this time. No acute issues.

## 2016-06-20 NOTE — ED Notes (Signed)
Pt. Noted in room. No complaints or concerns voiced. No distress or abnormal behavior noted. Will continue to monitor with  Q 15 minute rounds continue.   

## 2016-06-20 NOTE — ED Provider Notes (Signed)
-----------------------------------------   2:26 PM on 06/20/2016 -----------------------------------------   Blood pressure (!) 100/43, pulse 72, temperature 98.4 F (36.9 C), temperature source Oral, resp. rate 18, height 5\' 2"  (1.575 m), weight 160 lb (72.6 kg), last menstrual period 05/28/2016, SpO2 99 %.  The patient had no acute events since last update.  Calm and cooperative at this time.  Disposition is pending per Psychiatry/Behavioral Medicine team recommendations.     Nita Sicklearolina Bodhi Moradi, MD 06/20/16 [phone]

## 2016-06-20 NOTE — ED Notes (Signed)
Pt woke up and stated that she had an "accident in bed". Pt assisted with new clothes at this time. This tech cleaned up her bed, provided new sheets with two blue chucks and gave her two warm blankets at this time. No concerns voiced at this time. Pt continues resting.

## 2016-06-20 NOTE — ED Notes (Signed)
Patient in bathroom at this time.

## 2016-06-20 NOTE — ED Notes (Signed)

## 2016-06-20 NOTE — ED Notes (Signed)
BEHAVIORAL HEALTH ROUNDING Patient sleeping: No. Patient alert and oriented: yes Behavior appropriate: Yes.  ; If no, describe:  Nutrition and fluids offered: Yes  Toileting and hygiene offered: Yes  Sitter present: not applicable Law enforcement present: Yes  

## 2016-06-20 NOTE — ED Notes (Signed)
Pt given chocolate milk,graham crackers and peanut butter.

## 2016-06-20 NOTE — ED Notes (Signed)
Pt given meal tray.

## 2016-06-20 NOTE — ED Notes (Signed)
ED BHU PLACEMENT JUSTIFICATION Is the patient under IVC or is there intent for IVC: Yes.   Is the patient medically cleared: Yes.   Is there vacancy in the ED BHU: Yes.   Is the population mix appropriate for patient: No. Is the patient awaiting placement in inpatient or outpatient setting: Yes.   Has the patient had a psychiatric consult: Yes.   Survey of unit performed for contraband, proper placement and condition of furniture, tampering with fixtures in bathroom, shower, and each patient room: Yes.  ; Findings: none APPEARANCE/BEHAVIOR cooperative and adequate rapport can be established NEURO ASSESSMENT Orientation: place and person pt noted with MR Hallucinations: No.None noted (Hallucinations) Speech: Slurred at times and delayed Gait: shuffle RESPIRATORY ASSESSMENT Normal expansion.  Clear to auscultation.  No rales, rhonchi, or wheezing. CARDIOVASCULAR ASSESSMENT regular rate and rhythm, S1, S2 normal, no murmur, click, rub or gallop GASTROINTESTINAL ASSESSMENT soft, nontender, BS WNL, no r/g EXTREMITIES normal strength, tone, and muscle mass PLAN OF CARE Provide calm/safe environment. Vital signs assessed three times a day. ED Quad  Assessment once each 12-hour shift. Collaborate with intake RN daily or as condition indicates. Assure the ED provider has rounded once each shift. Provide and encourage hygiene. Provide redirection as needed. Assess for escalating behavior; address immediately and inform ED provider.

## 2016-06-20 NOTE — BH Assessment (Signed)
[]  Hide copied text Writer confirmed([phone]) [facility]

## 2016-06-20 NOTE — ED Notes (Signed)
BEHAVIORAL HEALTH ROUNDING Patient sleeping: Yes.   Patient alert and oriented: yes Behavior appropriate: Yes.  ; If no, describe:  Nutrition and fluids offered: Yes  Toileting and hygiene offered: Yes  Sitter present: no Law enforcement present: Yes  

## 2016-06-20 NOTE — ED Notes (Signed)
BEHAVIORAL HEALTH ROUNDING Patient sleeping: Yes.   Patient alert and oriented: not applicable Behavior appropriate: Yes.  ; If no, describe:  Nutrition and fluids offered: No Toileting and hygiene offered: No Sitter present: not applicable Law enforcement present: Yes  

## 2016-06-20 NOTE — ED Notes (Signed)
Patient was taken to the BHU shower, to shower and her bed sheets were changed too.

## 2016-06-20 NOTE — ED Notes (Signed)
BEHAVIORAL HEALTH ROUNDING Patient sleeping: No. Patient alert and oriented: yes Behavior appropriate: Yes.  ; If no, describe:  Nutrition and fluids offered: Yes  Toileting and hygiene offered: Yes  Sitter present: no Law enforcement present: Yes  

## 2016-06-20 NOTE — ED Notes (Signed)
Pt sleeping. 

## 2016-06-20 NOTE — ED Notes (Signed)

## 2016-06-20 NOTE — ED Notes (Signed)
BEHAVIORAL HEALTH ROUNDING Patient sleeping: No. Patient alert and oriented: yes Behavior appropriate: Yes.  ; If no, describe:  Nutrition and fluids offered: No Toileting and hygiene offered: Yes  Sitter present: not applicable Law enforcement present: Yes  

## 2016-06-21 DIAGNOSIS — F71 Moderate intellectual disabilities: Secondary | ICD-10-CM | POA: Diagnosis not present

## 2016-06-21 LAB — URINALYSIS COMPLETE WITH MICROSCOPIC (ARMC ONLY)
Bilirubin Urine: NEGATIVE
Glucose, UA: NEGATIVE mg/dL
Ketones, ur: NEGATIVE mg/dL
Nitrite: NEGATIVE
PH: 7 (ref 5.0–8.0)
PROTEIN: NEGATIVE mg/dL
Specific Gravity, Urine: 1.012 (ref 1.005–1.030)

## 2016-06-21 MED ORDER — ONDANSETRON 4 MG PO TBDP
4.0000 mg | ORAL_TABLET | Freq: Once | ORAL | Status: AC
  Administered 2016-06-21: via ORAL

## 2016-06-21 MED ORDER — ONDANSETRON 4 MG PO TBDP
ORAL_TABLET | ORAL | Status: AC
  Filled 2016-06-21: qty 1

## 2016-06-21 MED ORDER — BISMUTH SUBSALICYLATE 262 MG/15ML PO SUSP: 30.0000 mL | Freq: Once | ORAL | Status: DC

## 2016-06-21 MED ORDER — FOSFOMYCIN TROMETHAMINE 3 G PO PACK
3.0000 g | PACK | Freq: Once | ORAL | Status: AC
  Administered 2016-06-21: 3 g via ORAL
  Filled 2016-06-21: qty 3

## 2016-06-21 MED ORDER — BACITRACIN ZINC 500 UNIT/GM EX OINT
TOPICAL_OINTMENT | CUTANEOUS | Status: AC
  Filled 2016-06-21: qty 0.9

## 2016-06-21 NOTE — ED Notes (Signed)
Pt. Noted in room dancing. No complaints or concerns voiced. No distress or abnormal behavior noted. Will continue to monitor and Q 15 minute rounds continue.

## 2016-06-21 NOTE — ED Notes (Signed)
Pt up to the toliet. Noted underwear damp. Underwear changed at this time.

## 2016-06-21 NOTE — ED Provider Notes (Signed)
-----------------------------------------   6:48 AM on 06/21/2016 -----------------------------------------   Blood pressure (!) 135/91, pulse 95, temperature 98.1 F (36.7 C), temperature source Oral, resp. rate 17, height 5\' 2"  (1.575 m), weight 160 lb (72.6 kg), last menstrual period 05/28/2016, SpO2 97 %.  The patient had no acute events since last update.  Calm and cooperative at this time.  [facility]  During the evening the nurse reported that the patient had some foul-smelling urine and was complaining of some pain when she urinates. We did check a urinalysis and it was found that the patient had too numerous to count white blood cells in her urine and few bacteria. Her urine was also cloudy. I will give the patient a dose of fosfomycin and I will send the urine for culture.     Rebecka ApleyAllison P Nubia Ziesmer, MD 06/21/16 [phone]

## 2016-06-21 NOTE — BH Assessment (Signed)
Writer called and confirmed([phone]) [facility]

## 2016-06-21 NOTE — ED Notes (Signed)
Pt ambulated to bathroom asking for assistance.  The urine in the toilet was yellow, cloudy, clumpy, and strong odor.  Asked the pt if it hurt when she urinated and she said yes.  Pt told when she has to go again, to go in a cup and give us a sample.  Pt agreed.  RN aware.

## 2016-06-21 NOTE — Consult Note (Signed)
  Psychiatry: Follow-up for this 25 year old woman with developmental disability and behavior problems. She has no new complaints today. Behavior remains reasonably well controlled in the emergency room setting. No new physical complaints. On exam the patient is well groomed and is cooperating with taking care of her hygiene. Eye contact intermittent as is usual. Speech is decreased in amount which is normal. Affect flat. She only wants to ask me when she will be discharged and I told her that I was afraid I did not have any news to give her.  It had been my understanding some days ago that the patient might be discharged back to her group home but evidently that plan has been put on hold for now. Not clear to me when if ever that might come through but probably not over the holiday weekend. No change to current medicine. Supportive counseling review of plan.

## 2016-06-22 NOTE — ED Notes (Signed)
Pt up to the hallway. Pt stated her pants and bed were wet. Pt given new scrub pants, underwear and wipes to clean self. Linen changed and warm blankets given.

## 2016-06-22 NOTE — ED Provider Notes (Signed)
-----------------------------------------   7:35 AM on 06/22/2016 -----------------------------------------   Blood pressure 118/74, pulse 100, temperature 98.4 F (36.9 C), temperature source Oral, resp. rate 18, height 5\' 2"  (1.575 m), weight 160 lb (72.6 kg), last menstrual period 05/28/2016, SpO2 99 %.  The patient had no acute events since last update.  Calm and cooperative at this time.  Disposition is pending per Psychiatry/Behavioral Medicine team recommendations.     Nita Sicklearolina Raymie Giammarco, MD 06/22/16 [phone]

## 2016-06-22 NOTE — BH Assessment (Signed)
Writer confirmed([phone]) [facility]

## 2016-06-23 LAB — URINE CULTURE

## 2016-06-23 MED ORDER — CEPHALEXIN 500 MG PO CAPS
500.0000 mg | ORAL_CAPSULE | Freq: Two times a day (BID) | ORAL | Status: AC
  Administered 2016-06-23 – 2016-06-26 (×7): 500 mg via ORAL
  Filled 2016-06-23 (×7): qty 1

## 2016-06-23 NOTE — ED Provider Notes (Signed)
-----------------------------------------   6:30 AM on 06/23/2016 -----------------------------------------   Blood pressure 118/74, pulse 100, temperature 98.4 F (36.9 C), temperature source Oral, resp. rate 18, height 5\' 2"  (1.575 m), weight 160 lb (72.6 kg), last menstrual period 05/28/2016, SpO2 99 %.  The patient had no acute events since last update.  Calm and cooperative at this time.  Disposition is pending Psychiatry/Behavioral Medicine team recommendations.     Irean HongJade J Sung, MD 06/23/16 [phone]

## 2016-06-23 NOTE — ED Notes (Signed)
Patient was taken to the BHU to shower. The patients bed sheets were changed too.

## 2016-06-23 NOTE — ED Notes (Signed)
BEHAVIORAL HEALTH ROUNDING Patient sleeping: No. Patient alert and oriented: yes Behavior appropriate: Yes.  ; If no, describe:  Nutrition and fluids offered: Yes  Toileting and hygiene offered: Yes  Sitter present: q 15 min checks Law enforcement present: Yes  

## 2016-06-23 NOTE — ED Notes (Signed)
BEHAVIORAL HEALTH ROUNDING Patient sleeping: No. Patient alert and oriented: yes Behavior appropriate: Yes.  ; If no, describe:  Nutrition and fluids offered: No Toileting and hygiene offered: Yes  Sitter present: q 15 min checks Law enforcement present: Yes

## 2016-06-23 NOTE — Progress Notes (Signed)
LCS[facility] and [facility] are no [facility] this weekend.  LCSW called Ms Maureen RalphsVivian [phone] and left a voice mail message awaiting call.  Miara Emminger LCSW

## 2016-06-23 NOTE — ED Notes (Signed)
BEHAVIORAL HEALTH ROUNDING Patient sleeping: Yes.   Patient alert and oriented: sleeping Behavior appropriate: Yes.  ; If no, describe:  Nutrition and fluids offered: sleeping Toileting and hygiene offered: Yes  Sitter present: q 15 min checks Law enforcement present: Yes

## 2016-06-23 NOTE — ED Notes (Signed)
Pt. Up and used bathroom.  Pt. Requested new pair of underwear.  Pt. Given new pair.

## 2016-06-24 MED ORDER — LOPERAMIDE HCL 2 MG PO CAPS
4.0000 mg | ORAL_CAPSULE | Freq: Once | ORAL | Status: AC
  Administered 2016-06-24: 4 mg via ORAL
  Filled 2016-06-24: qty 2

## 2016-06-24 NOTE — ED Notes (Signed)
Pt given lunch tray at this time. NAD noted. Will continue to monitor for further patient needs.

## 2016-06-24 NOTE — Progress Notes (Signed)
LCSW called patients group home director Juleen ChinaVivian Timmons  [phone] and left detailed message to see when she might be picked up by group home this weekend or on Monday. Ms Maureen RalphsVivian called back and [facility] was [facility].  Hillsborolaudine Faithe Ariola LCSW [phone]

## 2016-06-24 NOTE — ED Notes (Signed)
Report given to Ann, RN.

## 2016-06-24 NOTE — ED Notes (Signed)
BEHAVIORAL HEALTH ROUNDING  Patient sleeping: No.  Patient alert and oriented: yes  Behavior appropriate: Yes. ; If no, describe:  Nutrition and fluids offered: Yes  Toileting and hygiene offered: Yes  Sitter present: not applicable, Q 15 min safety rounds and observation.  Law enforcement present: Yes ODS  

## 2016-06-24 NOTE — ED Provider Notes (Signed)
-----------------------------------------   7:17 AM on 06/24/2016 -----------------------------------------   Blood pressure (!) 122/27, pulse 98, temperature 98.5 F (36.9 C), temperature source Oral, resp. rate 18, height 5\' 2"  (1.575 m), weight 160 lb (72.6 kg), last menstrual period 05/28/2016, SpO2 98 %.  Patient had several episodes of diarrhea overnight.  She was given Imodium with good relief of symptoms. If diarrhea persists, consider stool culture and C. difficile. Calm and cooperative at this time.  Disposition is pending Psychiatry/Behavioral Medicine team recommendations.     Irean HongJade J Sung, MD 06/24/16 [phone]

## 2016-06-24 NOTE — ED Notes (Signed)
BEHAVIORAL HEALTH ROUNDING Patient sleeping: Yes.   Patient alert and oriented: not applicable SLEEPING Behavior appropriate: Yes.  ; If no, describe: SLEEPING Nutrition and fluids offered: No SLEEPING Toileting and hygiene offered: NoSLEEPING Sitter present: not applicable, Q 15 min safety rounds and observation. Law enforcement present: Yes ODS 

## 2016-06-24 NOTE — ED Notes (Signed)
Pt returned from shower at this time, pt given fresh linen on bed. Denies further needs. Will continue to monitor for further patient needs at this time.

## 2016-06-24 NOTE — ED Notes (Signed)
Pt visualized in NAD. Pt received breakfast tray. Pt denies further needs at this time. Will continue to monitor for further patient needs.

## 2016-06-24 NOTE — ED Notes (Signed)
Pt resting in bed at this time. NAD noted. Will continue to monitor for further patient needs.  

## 2016-06-24 NOTE — ED Notes (Signed)
Pt taken to shower in BHU by Theodoro Gristave, EDT-P.

## 2016-06-24 NOTE — ED Notes (Signed)
Pt visualized in NAD. Pt walking around in room. Pt denies any needs at this time. Will continue to monitor for further patient needs.

## 2016-06-24 NOTE — ED Notes (Signed)
Pt given dinner tray. NAD noted. Will continue to monitor for further patient needs.

## 2016-06-24 NOTE — ED Notes (Signed)
ENVIRONMENTAL ASSESSMENT  Potentially harmful objects out of patient reach: Yes.  Personal belongings secured: Yes.  Patient dressed in hospital provided attire only: Yes.  Plastic bags out of patient reach: Yes.  Patient care equipment (cords, cables, call bells, lines, and drains) shortened, removed, or accounted for: Yes.  Equipment and supplies removed from bottom of stretcher: Yes.  Potentially toxic materials out of patient reach: Yes.  Sharps container removed or out of patient reach: Yes.   BEHAVIORAL HEALTH ROUNDING  Patient sleeping: No.  Patient alert and oriented: yes  Behavior appropriate: Yes. ; If no, describe:  Nutrition and fluids offered: Yes  Toileting and hygiene offered: Yes  Sitter present: not applicable, Q 15 min safety rounds and observation.  Law enforcement present: Yes ODS  ED BHU PLACEMENT JUSTIFICATION  Is the patient under IVC or is there intent for IVC: Yes.  Is the patient medically cleared: Yes.  Is there vacancy in the ED BHU: Yes.  Is the population mix appropriate for patient: No.  Is the patient awaiting placement in inpatient or outpatient setting: Yes. Awaiting group home placement.  Has the patient had a psychiatric consult: Yes.  Survey of unit performed for contraband, proper placement and condition of furniture, tampering with fixtures in bathroom, shower, and each patient room: Yes. ; Findings: All clear  APPEARANCE/BEHAVIOR  calm, cooperative and adequate rapport can be established  NEURO ASSESSMENT  Orientation: time, place and person  Hallucinations: No.None noted (Hallucinations)  Speech: Normal  Gait: normal  RESPIRATORY ASSESSMENT  WNL  CARDIOVASCULAR ASSESSMENT  WNL  GASTROINTESTINAL ASSESSMENT  WNL  EXTREMITIES  WNL  PLAN OF CARE  Provide calm/safe environment. Vital signs assessed TID. ED BHU Assessment once each 12-hour shift. Collaborate with TTS daily or as condition indicates. Assure the ED provider has rounded once  each shift. Provide and encourage hygiene. Provide redirection as needed. Assess for escalating behavior; address immediately and inform ED provider.  Assess family dynamic and appropriateness for visitation as needed: Yes. ; If necessary, describe findings:  Educate the patient/family about BHU procedures/visitation: Yes. ; If necessary, describe findings: Pt is calm and cooperative at this time. Pt understanding and accepting of unit procedures/rules. Will continue to monitor with Q 15 min safety rounds and observation.

## 2016-06-25 NOTE — ED Notes (Signed)
BEHAVIORAL HEALTH ROUNDING Patient sleeping: No. Patient alert and oriented: yes Behavior appropriate: Yes.  ; If no, describe:  Nutrition and fluids offered: Yes  Toileting and hygiene offered: Yes  Sitter present: not applicable Law enforcement present: Yes  

## 2016-06-25 NOTE — ED Notes (Signed)
BEHAVIORAL HEALTH ROUNDING Patient sleeping: Yes.   Patient alert and oriented: not applicable SLEEPING Behavior appropriate: Yes.  ; If no, describe: SLEEPING Nutrition and fluids offered: No SLEEPING Toileting and hygiene offered: NoSLEEPING Sitter present: not applicable, Q 15 min safety rounds and observation. Law enforcement present: Yes ODS 

## 2016-06-25 NOTE — Progress Notes (Signed)
Spoke to Hannah [name]  [phone] and  it was confirmed [name] after 9am.  LCSW weekend SW will give handoff to weekday SW to follow up on Monday.   Delta Air LinesClaudine Roxy Mastandrea LCSW [phone]

## 2016-06-25 NOTE — ED Provider Notes (Signed)
-----------------------------------------   6:53 AM on 06/25/2016 -----------------------------------------   Blood pressure 95/78, pulse (!) 104, temperature 98.2 F (36.8 C), temperature source Oral, resp. rate 18, height 5\' 2"  (1.575 m), weight 160 lb (72.6 kg), last menstrual period 05/28/2016, SpO2 99 %.  The patient had no acute events since last update.  Calm and cooperative at this time.  Disposition is pending Psychiatry/Behavioral Medicine team recommendations.     Irean HongJade J Sung, MD 06/25/16 [phone]

## 2016-06-25 NOTE — ED Notes (Signed)
BEHAVIORAL HEALTH ROUNDING Patient sleeping: Yes.   Patient alert and oriented: yes Behavior appropriate: Yes.  ; If no, describe:  Nutrition and fluids offered: No Toileting and hygiene offered: No Sitter present: not applicable Law enforcement present: Yes  

## 2016-06-25 NOTE — ED Notes (Signed)
Pt IVC/ Pending Placement 

## 2016-06-25 NOTE — ED Notes (Signed)
BEHAVIORAL HEALTH ROUNDING  Patient sleeping: No.  Patient alert and oriented: yes  Behavior appropriate: Yes. ; If no, describe:  Nutrition and fluids offered: Yes  Toileting and hygiene offered: Yes  Sitter present: not applicable, Q 15 min safety rounds and observation.  Law enforcement present: Yes ODS  

## 2016-06-25 NOTE — ED Notes (Signed)
BEHAVIORAL HEALTH ROUNDING Patient sleeping: No. Patient alert and oriented: yes Behavior appropriate: Yes.  ; If no, describe:  Nutrition and fluids offered: Yes  Toileting and hygiene offered: Yes  Sitter present: no Law enforcement present: Yes  

## 2016-06-25 NOTE — ED Notes (Signed)
Gave pt a sprite. 

## 2016-06-25 NOTE — ED Notes (Signed)
Pt with unlabored resps, pt ambulatory around room without difficulty, pt denies pain. Pt appears in no acute distress. Po fluids provided. Pt offered a snack.

## 2016-06-25 NOTE — ED Notes (Signed)
BEHAVIORAL HEALTH ROUNDING Patient sleeping: Yes.   Patient alert and oriented: not applicable Behavior appropriate: Yes.  ; If no, describe:  Nutrition and fluids offered: No Toileting and hygiene offered: No Sitter present: not applicable Law enforcement present: Yes  

## 2016-06-25 NOTE — ED Notes (Signed)
Pt awake, oriented at her baseline. Calm and cooperative. No needs or complaints. Denies pain.

## 2016-06-25 NOTE — ED Notes (Signed)
Report from bill, rn. Pt is up to restroom without difficulty.

## 2016-06-26 MED ORDER — CEPHALEXIN 500 MG PO CAPS: 500.0000 mg | ORAL_CAPSULE | Freq: Two times a day (BID) | ORAL | 0 refills | Status: DC

## 2016-06-26 NOTE — ED Notes (Signed)
Report to casey, rn.  

## 2016-06-26 NOTE — Progress Notes (Signed)
CSW called pt's group home director Maureen RalphsVivian [phone]. Maureen RalphsVivian confirms that pt will be picked up at 10 am. CSW informed ED secretary and EDP. EDP will rescind IVC. CSW will continue to follow pt and assist with d/c as needed.  Jonathon JordanLynn B Montez Stryker, MSW, Theresia MajorsLCSWA [phone]

## 2016-06-26 NOTE — ED Notes (Signed)
Pt sleeping, resps unlabored.  

## 2016-06-26 NOTE — ED Notes (Addendum)
Pt rolled out of bed, no injury noted. Pt back up to bed without difficulty. md notified. siderails up x4 out of 4 now. resps unlabored.

## 2016-06-26 NOTE — Discharge Instructions (Signed)
Follow up with your primary care doctor to recheck the urinalysis in 1 week to ensure resolution of your urinary tract infection.  Continue following up with your mental health providers.

## 2016-06-26 NOTE — ED Notes (Signed)
Patient refused a bath this morning.

## 2016-06-26 NOTE — ED Notes (Signed)
Pt currently in bed sleeping, side rails are up. Pt is being monitored by sitter. Nothing needed by staff

## 2016-06-27 ENCOUNTER — Emergency Department
Admission: EM | Admit: 2016-06-27 | Discharge: 2016-07-09 | Payer: Medicaid Other | Attending: Emergency Medicine | Admitting: Emergency Medicine

## 2016-06-27 ENCOUNTER — Encounter: Payer: Self-pay | Admitting: Emergency Medicine

## 2016-06-27 DIAGNOSIS — Z791 Long term (current) use of non-steroidal anti-inflammatories (NSAID): Secondary | ICD-10-CM | POA: Insufficient documentation

## 2016-06-27 DIAGNOSIS — R4689 Other symptoms and signs involving appearance and behavior: Secondary | ICD-10-CM

## 2016-06-27 DIAGNOSIS — F209 Schizophrenia, unspecified: Secondary | ICD-10-CM

## 2016-06-27 DIAGNOSIS — F918 Other conduct disorders: Secondary | ICD-10-CM | POA: Insufficient documentation

## 2016-06-27 DIAGNOSIS — Z79899 Other long term (current) drug therapy: Secondary | ICD-10-CM | POA: Diagnosis not present

## 2016-06-27 DIAGNOSIS — F79 Unspecified intellectual disabilities: Secondary | ICD-10-CM

## 2016-06-27 DIAGNOSIS — F200 Paranoid schizophrenia: Secondary | ICD-10-CM | POA: Diagnosis present

## 2016-06-27 DIAGNOSIS — Z046 Encounter for general psychiatric examination, requested by authority: Secondary | ICD-10-CM | POA: Diagnosis present

## 2016-06-27 DIAGNOSIS — F0391 Unspecified dementia with behavioral disturbance: Secondary | ICD-10-CM | POA: Diagnosis not present

## 2016-06-27 DIAGNOSIS — F71 Moderate intellectual disabilities: Secondary | ICD-10-CM | POA: Diagnosis present

## 2016-06-27 LAB — URINE DRUG SCREEN, QUALITATIVE (ARMC ONLY)
AMPHETAMINES, UR SCREEN: NOT DETECTED
BARBITURATES, UR SCREEN: NOT DETECTED
BENZODIAZEPINE, UR SCRN: POSITIVE — AB
CANNABINOID 50 NG, UR ~~LOC~~: NOT DETECTED
Cocaine Metabolite,Ur ~~LOC~~: NOT DETECTED
MDMA (Ecstasy)Ur Screen: NOT DETECTED
Methadone Scn, Ur: NOT DETECTED
OPIATE, UR SCREEN: NOT DETECTED
PHENCYCLIDINE (PCP) UR S: NOT DETECTED
Tricyclic, Ur Screen: POSITIVE — AB

## 2016-06-27 LAB — ETHANOL

## 2016-06-27 LAB — ACETAMINOPHEN LEVEL: Acetaminophen (Tylenol), Serum: <10 ug/mL — ABNORMAL LOW (ref 10–30)

## 2016-06-27 LAB — SALICYLATE LEVEL: Salicylate Lvl: <7 mg/dL (ref 2.8–30.0)

## 2016-06-27 LAB — COMPREHENSIVE METABOLIC PANEL
ALBUMIN: 3.9 g/dL (ref 3.5–5.0)
ALK PHOS: 45 U/L (ref 38–126)
ALT: 19 U/L (ref 14–54)
AST: 40 U/L (ref 15–41)
Anion gap: 7 (ref 5–15)
BILIRUBIN TOTAL: 0.7 mg/dL (ref 0.3–1.2)
BUN: 29 mg/dL — AB (ref 6–20)
CALCIUM: 9.7 mg/dL (ref 8.9–10.3)
CO2: 25 mmol/L (ref 22–32)
Chloride: 109 mmol/L (ref 101–111)
Creatinine, Ser: 1.56 mg/dL — ABNORMAL HIGH (ref 0.44–1.00)
GFR calc Af Amer: 53 mL/min — ABNORMAL LOW (ref >60)
GFR, EST NON AFRICAN AMERICAN: 45 mL/min — AB (ref >60)
GLUCOSE: 97 mg/dL (ref 65–99)
POTASSIUM: 3.6 mmol/L (ref 3.5–5.1)
Sodium: 141 mmol/L (ref 135–145)
TOTAL PROTEIN: 7.7 g/dL (ref 6.5–8.1)

## 2016-06-27 LAB — URINALYSIS COMPLETE WITH MICROSCOPIC (ARMC ONLY)
BILIRUBIN URINE: NEGATIVE
Bacteria, UA: NONE SEEN
GLUCOSE, UA: NEGATIVE mg/dL
HGB URINE DIPSTICK: NEGATIVE
Ketones, ur: NEGATIVE mg/dL
Leukocytes, UA: NEGATIVE
Nitrite: NEGATIVE
Protein, ur: NEGATIVE mg/dL
Specific Gravity, Urine: 1.014 (ref 1.005–1.030)
pH: 6 (ref 5.0–8.0)

## 2016-06-27 LAB — CBC
HEMATOCRIT: 36 % (ref 35.0–47.0)
Hemoglobin: 12 g/dL (ref 12.0–16.0)
MCH: 30.3 pg (ref 26.0–34.0)
MCHC: 33.5 g/dL (ref 32.0–36.0)
MCV: 90.6 fL (ref 80.0–100.0)
PLATELETS: 275 10*3/uL (ref 150–440)
RBC: 3.97 MIL/uL (ref 3.80–5.20)
RDW: 12.8 % (ref 11.5–14.5)
WBC: 13.2 10*3/uL — AB (ref 3.6–11.0)

## 2016-06-27 LAB — POCT PREGNANCY, URINE: PREG TEST UR: NEGATIVE

## 2016-06-27 NOTE — ED Notes (Addendum)
EDT, French Anaracy, in triage to dress pt into behav scrubs--burgandy print leggings, green tshirt, grey/blue tennis shoes, and pink coat removed and placed in labeled pt belongings bag to be secured on nursing station
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
## 2016-06-28 DIAGNOSIS — F0391 Unspecified dementia with behavioral disturbance: Secondary | ICD-10-CM

## 2016-06-28 MED ORDER — ASENAPINE MALEATE 10 MG SL SUBL
1.0000 | SUBLINGUAL_TABLET | Freq: Every day | SUBLINGUAL | Status: DC
  Administered 2016-06-28 – 2016-07-01 (×4): 10 mg via SUBLINGUAL
  Filled 2016-06-28: qty 1

## 2016-06-28 MED ORDER — BUPROPION HCL ER (SR) 150 MG PO TB12
ORAL_TABLET | ORAL | Status: AC
  Filled 2016-06-28: qty 1

## 2016-06-28 MED ORDER — MECLIZINE HCL 25 MG PO TABS: 25.0000 mg | ORAL_TABLET | Freq: Three times a day (TID) | ORAL | Status: DC | PRN

## 2016-06-28 MED ORDER — ASENAPINE MALEATE 5 MG SL SUBL
SUBLINGUAL_TABLET | SUBLINGUAL | Status: AC
  Administered 2016-06-28: 10 mg via SUBLINGUAL
  Filled 2016-06-28: qty 2

## 2016-06-28 MED ORDER — MEDROXYPROGESTERONE ACETATE 150 MG/ML IM SUSP: 150.0000 mg | INTRAMUSCULAR | Status: DC

## 2016-06-28 MED ORDER — BENZTROPINE MESYLATE 0.5 MG PO TABS
1.0000 mg | ORAL_TABLET | Freq: Two times a day (BID) | ORAL | Status: DC
  Administered 2016-06-28 – 2016-07-03 (×11): 1 mg via ORAL
  Administered 2016-07-03: 0.5 mg via ORAL
  Administered 2016-07-04 – 2016-07-08 (×9): 1 mg via ORAL
  Filled 2016-06-28 (×21): qty 2

## 2016-06-28 MED ORDER — POLYETHYLENE GLYCOL 3350 17 G PO PACK
17.0000 g | PACK | ORAL | Status: DC
  Administered 2016-06-30 – 2016-07-06 (×4): 17 g via ORAL
  Filled 2016-06-28 (×5): qty 1

## 2016-06-28 MED ORDER — ARIPIPRAZOLE 5 MG PO TABS
15.0000 mg | ORAL_TABLET | Freq: Every day | ORAL | Status: DC
  Administered 2016-06-28 – 2016-07-07 (×10): 15 mg via ORAL
  Filled 2016-06-28 (×11): qty 3

## 2016-06-28 MED ORDER — QUETIAPINE FUMARATE 25 MG PO TABS
50.0000 mg | ORAL_TABLET | Freq: Four times a day (QID) | ORAL | Status: DC | PRN
  Administered 2016-07-01 – 2016-07-07 (×2): 50 mg via ORAL
  Filled 2016-06-28 (×2): qty 2

## 2016-06-28 MED ORDER — FERROUS SULFATE 325 (65 FE) MG PO TABS
325.0000 mg | ORAL_TABLET | Freq: Every day | ORAL | Status: DC
  Administered 2016-06-28 – 2016-07-07 (×10): 325 mg via ORAL
  Filled 2016-06-28 (×11): qty 1

## 2016-06-28 MED ORDER — DIVALPROEX SODIUM 250 MG PO DR TAB
750.0000 mg | DELAYED_RELEASE_TABLET | Freq: Two times a day (BID) | ORAL | Status: DC
  Administered 2016-06-28 – 2016-07-08 (×20): 750 mg via ORAL
  Filled 2016-06-28 (×17): qty 3

## 2016-06-28 MED ORDER — LITHIUM CARBONATE 300 MG PO CAPS
300.0000 mg | ORAL_CAPSULE | Freq: Every day | ORAL | Status: DC
  Administered 2016-06-28 – 2016-07-08 (×11): 300 mg via ORAL
  Filled 2016-06-28 (×11): qty 1

## 2016-06-28 MED ORDER — LORAZEPAM 1 MG PO TABS
1.0000 mg | ORAL_TABLET | Freq: Two times a day (BID) | ORAL | Status: DC
  Administered 2016-06-28 – 2016-07-08 (×21): 1 mg via ORAL
  Filled 2016-06-28 (×21): qty 1

## 2016-06-28 MED ORDER — NALTREXONE HCL 50 MG PO TABS
50.0000 mg | ORAL_TABLET | Freq: Every day | ORAL | Status: DC
  Administered 2016-06-28 – 2016-07-07 (×9): 50 mg via ORAL
  Filled 2016-06-28 (×12): qty 1

## 2016-06-28 MED ORDER — VITAMIN D 1000 UNITS PO TABS
1000.0000 [IU] | ORAL_TABLET | Freq: Two times a day (BID) | ORAL | Status: DC
  Administered 2016-06-28 – 2016-07-08 (×21): 1000 [IU] via ORAL
  Filled 2016-06-28 (×22): qty 1

## 2016-06-28 MED ORDER — CEPHALEXIN 500 MG PO CAPS
500.0000 mg | ORAL_CAPSULE | Freq: Two times a day (BID) | ORAL | Status: DC
  Administered 2016-06-28 – 2016-07-04 (×14): 500 mg via ORAL
  Filled 2016-06-28 (×14): qty 1

## 2016-06-28 MED ORDER — DARIFENACIN HYDROBROMIDE ER 7.5 MG PO TB24
7.5000 mg | ORAL_TABLET | Freq: Every day | ORAL | Status: DC
  Administered 2016-06-28 – 2016-07-07 (×10): 7.5 mg via ORAL
  Filled 2016-06-28 (×12): qty 1

## 2016-06-28 MED ORDER — DIVALPROEX SODIUM 125 MG PO CSDR
DELAYED_RELEASE_CAPSULE | ORAL | Status: AC
  Filled 2016-06-28: qty 6

## 2016-06-28 MED ORDER — PANTOPRAZOLE SODIUM 40 MG PO TBEC
40.0000 mg | DELAYED_RELEASE_TABLET | Freq: Every day | ORAL | Status: DC
  Administered 2016-06-28 – 2016-07-07 (×10): 40 mg via ORAL
  Filled 2016-06-28 (×11): qty 1

## 2016-06-28 MED ORDER — DOCUSATE SODIUM 100 MG PO CAPS
200.0000 mg | ORAL_CAPSULE | Freq: Every day | ORAL | Status: DC
  Administered 2016-06-28 – 2016-07-07 (×9): 200 mg via ORAL
  Filled 2016-06-28 (×11): qty 2

## 2016-06-28 MED ORDER — DIPHENHYDRAMINE HCL 25 MG PO CAPS
50.0000 mg | ORAL_CAPSULE | Freq: Every day | ORAL | Status: DC
  Administered 2016-06-28 – 2016-07-07 (×10): 50 mg via ORAL
  Filled 2016-06-28 (×11): qty 2

## 2016-06-28 MED ORDER — HYDROCHLOROTHIAZIDE 25 MG PO TABS
25.0000 mg | ORAL_TABLET | Freq: Every day | ORAL | Status: DC
  Administered 2016-06-28 – 2016-07-07 (×10): 25 mg via ORAL
  Filled 2016-06-28 (×11): qty 1

## 2016-06-28 MED ORDER — CLONIDINE HCL 0.1 MG/24HR TD PTWK
0.1000 mg | MEDICATED_PATCH | TRANSDERMAL | Status: DC
  Administered 2016-06-28 – 2016-07-07 (×2): 0.1 mg via TRANSDERMAL
  Filled 2016-06-28 (×3): qty 1

## 2016-06-28 MED ORDER — BUPROPION HCL ER (XL) 150 MG PO TB24
150.0000 mg | ORAL_TABLET | Freq: Every day | ORAL | Status: DC
  Administered 2016-06-28 – 2016-07-07 (×10): 150 mg via ORAL
  Filled 2016-06-28 (×12): qty 1

## 2016-06-28 MED ORDER — PAROXETINE HCL 20 MG PO TABS
20.0000 mg | ORAL_TABLET | Freq: Every day | ORAL | Status: DC
  Administered 2016-06-28 – 2016-07-07 (×10): 20 mg via ORAL
  Filled 2016-06-28 (×12): qty 1
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
## 2016-06-28 NOTE — ED Notes (Signed)
Pt was told she could have crayons and paper for 1 hour after she ate lunch. Pt ate lunch and has now received crayons and papers.

## 2016-06-28 NOTE — ED Notes (Signed)
IVC/Brief note done/pending disposition

## 2016-06-28 NOTE — ED Notes (Signed)
Breakfast was given to patient. 
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
## 2016-06-28 NOTE — ED Notes (Signed)
Patient in bathroom at this time.

## 2016-06-28 NOTE — ED Notes (Signed)
Patient in shower 

## 2016-06-29 MED ORDER — ASENAPINE MALEATE 5 MG SL SUBL
SUBLINGUAL_TABLET | SUBLINGUAL | Status: AC
  Administered 2016-06-29: 10 mg via SUBLINGUAL
  Filled 2016-06-29: qty 2

## 2016-06-29 NOTE — ED Provider Notes (Signed)
-----------------------------------------   6:18 AM on 06/29/2016 -----------------------------------------   Blood pressure 103/71, pulse 99, temperature 98.6 F (37 C), temperature source Oral, resp. rate 18, height 5\' 2"  (1.575 m), weight 160 lb (72.6 kg), last menstrual period 05/28/2016, SpO2 100 %.  The patient had no acute events since last update.  Calm and cooperative at this time.  Disposition is pending Psychiatry/Behavioral Medicine team recommendations.     Irean HongJade J Yoneko Talerico, MD 06/29/16 [phone]

## 2016-06-29 NOTE — ED Notes (Signed)
Given dinner tray

## 2016-06-29 NOTE — ED Notes (Signed)
Pt up to the bathroom. Requested and given new scrub pants and underwear at this time.

## 2016-06-29 NOTE — ED Notes (Signed)
Pt given sandwich tray and sprite.  

## 2016-06-29 NOTE — BH Assessment (Signed)
[facility] ([phone]), patient remain on Wait List.

## 2016-06-29 NOTE — Consult Note (Signed)
[place] Consult   Reason for Consult:  Consult for 25 year old woman follow-up who has schizoaffective disorder and developmental disability Referring Physician:  Joni Fears Patient Identification: Hannah Keller MRN:  [id number] Principal Diagnosis: Mental retardation, idiopathic moderate Diagnosis:   Patient Active Problem List   Diagnosis Date Noted  . Seizures ([place]) [R56.9] 05/22/2016  . 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 Paranoid schizophrenia ([place]) [F20.0] 04/12/2016  . Acute renal failure syndrome ([place]) [N17.9]   . 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 Sinus pause [I45.5]   . Lithium toxicity [T56.891A] 03/29/2015  . Mental retardation, idiopathic moderate [F71] 12/01/2014    Total Time spent with patient: 30 minutes  Subjective:   Hannah Keller is a 25 y.o. female patient admitted with "I'm okay".  Follow-up for Thursday the 37th. 25 year old woman with developmental disability. She has no new complaints today. She has been calm and easily redirectable. Not engaging in dangerous behavior here in the hospital. Cooperative with medication. Not making any threatening statements. Health appears to be stable.  HPI:  This is a follow-up for this 25 year old woman with developmental disability and schizoaffective disorder. She has been in the hospital for an extended period of time because of repeated problems with her behavior. Since being back in the emergency room we have made some medicine alterations. Her behavior has generally been pretty calm and manageable. Occasionally gets slightly loud but has not been violent or threatening. Today we received word from her group home that they are willing to give her another try with discharge because her behavior has improved.  Past Psychiatric History: Long-standing problems with behavior related to her developmental disability. Diagnosis schizoaffective disorder as well  Risk to Self: Suicidal Ideation: No Suicidal Intent:  No Is patient at risk for suicide?: No Suicidal Plan?: No Specify Current Suicidal Plan: none Access to Means: No What has been your use of drugs/alcohol within the last 12 months?: none How many times?: 0 Other Self Harm Risks: none noted Triggers for Past Attempts: Unpredictable Intentional Self Injurious Behavior: None Risk to Others: Homicidal Ideation: No Thoughts of Harm to Others: No Current Homicidal Intent: No Current Homicidal Plan: No Access to Homicidal Means: No Identified Victim: none History of harm to others?: No Assessment of Violence: In past 6-12 months Violent Behavior Description: Fights with others (fights with others) Does patient have access to weapons?: No Criminal Charges Pending?: No Does patient have a court date: No Prior Inpatient Therapy: Prior Inpatient Therapy: No (pt denies. Was last seen [facility] not admitted) Prior Therapy Dates: unknown Prior Therapy Facilty/Provider(s): unknown Reason for Treatment: Schizophrenia Prior Outpatient Therapy: Prior Outpatient Therapy: Yes Prior Therapy Dates: current Prior Therapy Facilty/Provider(s): Provided by Group Home Reason for Treatment: schizophrenia Does patient have an ACCT team?: Unknown Does patient have Intensive In-House Services?  : No Does patient have [facility] services? : No Does patient have P4CC services?: No  Past Medical History:  Past Medical History:  Diagnosis Date  . Developmental delay   . Mental retardation    Mild  . Paranoid schizophrenia ([place])   . Seizures ([place])     Past Surgical History:  Procedure Laterality Date  . ESOPHAGOGASTRODUODENOSCOPY (EGD) WITH PROPOFOL N/A 05/18/2015   Procedure: ESOPHAGOGASTRODUODENOSCOPY (EGD) WITH PROPOFOL;  Surgeon: Lollie Sails, MD;  Location: [facility] ENDOSCOPY;  Service: Endoscopy;  Laterality: N/A;   Family History:  Family History  Problem Relation Age of Onset  . Family history unknown: Yes   Family Psychiatric  History:  Unknown Social History:  History  Alcohol Use No     History  Drug Use No    Social History   Social History  . Marital status: Single    Spouse name: N/A  . Number of children: 0  . Years of education: n/a   Occupational History  . Disabled    Social History Main Topics  . Smoking status: Never Smoker  . Smokeless tobacco: Never Used  . Alcohol use No  . Drug use: No  . Sexual activity: Not Asked   Other Topics Concern  . None   Social History Narrative   Pt currently resides at [facility].   Caffeine Use: None   Additional Social History:    Allergies:   Allergies  Allergen Reactions  . Ritalin [Methylphenidate Hcl] Other (See Comments)    Reaction:  Unknown     Labs:  Results for orders placed or performed during the hospital encounter of 06/27/16 (from the past 48 hour(s))  Comprehensive metabolic panel     Status: Abnormal   Collection Time: 06/27/16 10:56 PM  Result Value Ref Range   Sodium 141 135 - 145 mmol/L   Potassium 3.6 3.5 - 5.1 mmol/L    Comment: HEMOLYSIS AT THIS LEVEL MAY AFFECT RESULT   Chloride 109 101 - 111 mmol/L   CO2 25 22 - 32 mmol/L   Glucose, Bld 97 65 - 99 mg/dL   BUN 29 (H) 6 - 20 mg/dL   Creatinine, Ser 1.56 (H) 0.44 - 1.00 mg/dL   Calcium 9.7 8.9 - 10.3 mg/dL   Total Protein 7.7 6.5 - 8.1 g/dL   Albumin 3.9 3.5 - 5.0 g/dL   AST 40 15 - 41 U/L    Comment: HEMOLYSIS AT THIS LEVEL MAY AFFECT RESULT   ALT 19 14 - 54 U/L   Alkaline Phosphatase 45 38 - 126 U/L   Total Bilirubin 0.7 0.3 - 1.2 mg/dL    Comment: HEMOLYSIS AT THIS LEVEL MAY AFFECT RESULT   GFR calc non Af Amer 45 (L) >60 mL/min   GFR calc Af Amer 53 (L) >60 mL/min    Comment: (NOTE) The eGFR has been calculated using the CKD EPI equation. This calculation has not been validated in all clinical situations. eGFR's persistently <60 mL/min signify possible Chronic Kidney Disease.    Anion gap 7 5 - 15  Ethanol     Status: None   Collection Time:  06/27/16 10:56 PM  Result Value Ref Range   Alcohol, Ethyl (B) <5 <5 mg/dL    Comment:        LOWEST DETECTABLE LIMIT FOR SERUM ALCOHOL IS 5 mg/dL FOR MEDICAL PURPOSES ONLY   Salicylate level     Status: None   Collection Time: 06/27/16 10:56 PM  Result Value Ref Range   Salicylate Lvl <2.4 2.8 - 30.0 mg/dL  Acetaminophen level     Status: Abnormal   Collection Time: 06/27/16 10:56 PM  Result Value Ref Range   Acetaminophen (Tylenol), Serum <10 (L) 10 - 30 ug/mL    Comment:        THERAPEUTIC CONCENTRATIONS VARY SIGNIFICANTLY. A RANGE OF 10-30 ug/mL MAY BE AN EFFECTIVE CONCENTRATION FOR MANY PATIENTS. HOWEVER, SOME ARE BEST TREATED AT CONCENTRATIONS OUTSIDE THIS RANGE. ACETAMINOPHEN CONCENTRATIONS >150 ug/mL AT 4 HOURS AFTER INGESTION AND >50 ug/mL AT 12 HOURS AFTER INGESTION ARE OFTEN ASSOCIATED WITH TOXIC REACTIONS.   cbc     Status: Abnormal   Collection Time: 06/27/16  10:56 PM  Result Value Ref Range   WBC 13.2 (H) 3.6 - 11.0 K/uL   RBC 3.97 3.80 - 5.20 MIL/uL   Hemoglobin 12.0 12.0 - 16.0 g/dL   HCT 36.0 35.0 - 47.0 %   MCV 90.6 80.0 - 100.0 fL   MCH 30.3 26.0 - 34.0 pg   MCHC 33.5 32.0 - 36.0 g/dL   RDW 12.8 11.5 - 14.5 %   Platelets 275 150 - 440 K/uL  Urine Drug Screen, Qualitative     Status: Abnormal   Collection Time: 06/27/16 10:56 PM  Result Value Ref Range   Tricyclic, Ur Screen POSITIVE (A) NONE DETECTED   Amphetamines, Ur Screen NONE DETECTED NONE DETECTED   MDMA (Ecstasy)Ur Screen NONE DETECTED NONE DETECTED   Cocaine Metabolite,Ur [place] NONE DETECTED NONE DETECTED   Opiate, Ur Screen NONE DETECTED NONE DETECTED   Phencyclidine (PCP) Ur S NONE DETECTED NONE DETECTED   Cannabinoid 50 Ng, Ur [place] NONE DETECTED NONE DETECTED   Barbiturates, Ur Screen NONE DETECTED NONE DETECTED   Benzodiazepine, Ur Scrn POSITIVE (A) NONE DETECTED   Methadone Scn, Ur NONE DETECTED NONE DETECTED    Comment: (NOTE) 732  Tricyclics, urine               Cutoff 1000  ng/mL 200  Amphetamines, urine             Cutoff 1000 ng/mL 300  MDMA (Ecstasy), urine           Cutoff 500 ng/mL 400  Cocaine Metabolite, urine       Cutoff 300 ng/mL 500  Opiate, urine                   Cutoff 300 ng/mL 600  Phencyclidine (PCP), urine      Cutoff 25 ng/mL 700  Cannabinoid, urine              Cutoff 50 ng/mL 800  Barbiturates, urine             Cutoff 200 ng/mL 900  Benzodiazepine, urine           Cutoff 200 ng/mL 1000 Methadone, urine                Cutoff 300 ng/mL 1100 1200 The urine drug screen provides only a preliminary, unconfirmed 1300 analytical test result and should not be used for non-medical 1400 purposes. Clinical consideration and professional judgment should 1500 be applied to any positive drug screen result due to possible 1600 interfering substances. A more specific alternate chemical method 1700 must be used in order to obtain a confirmed analytical result.  1800 Gas chromato graphy / mass spectrometry (GC/MS) is the preferred 1900 confirmatory method.   Urinalysis complete, with microscopic ([facility] only)     Status: Abnormal   Collection Time: 06/27/16 10:56 PM  Result Value Ref Range   Color, Urine STRAW (A) YELLOW   APPearance CLEAR (A) CLEAR   Glucose, UA NEGATIVE NEGATIVE mg/dL   Bilirubin Urine NEGATIVE NEGATIVE   Ketones, ur NEGATIVE NEGATIVE mg/dL   Specific Gravity, Urine 1.014 1.005 - 1.030   Hgb urine dipstick NEGATIVE NEGATIVE   pH 6.0 5.0 - 8.0   Protein, ur NEGATIVE NEGATIVE mg/dL   Nitrite NEGATIVE NEGATIVE   Leukocytes, UA NEGATIVE NEGATIVE   RBC / HPF 0-5 0 - 5 RBC/hpf   WBC, UA 0-5 0 - 5 WBC/hpf   Bacteria, UA NONE SEEN NONE SEEN   Squamous Epithelial / LPF  0-5 (A) NONE SEEN   Mucous PRESENT    Hyaline Casts, UA PRESENT   Pregnancy, urine POC     Status: None   Collection Time: 06/27/16 11:16 PM  Result Value Ref Range   Preg Test, Ur NEGATIVE NEGATIVE    Comment:        THE SENSITIVITY OF THIS METHODOLOGY IS >24  mIU/mL     Current Facility-Administered Medications  Medication Dose Route Frequency Provider Last Rate Last Dose  . ARIPiprazole (ABILIFY) tablet 15 mg  15 mg Oral Q1200 Earleen Newport, MD   15 mg at 06/29/16 1210  . Asenapine Maleate SUBL 10 mg  1 tablet Sublingual Daily Earleen Newport, MD   10 mg at 06/29/16 1005  . benztropine (COGENTIN) tablet 1 mg  1 mg Oral BID Earleen Newport, MD   1 mg at 06/29/16 1004  . buPROPion (WELLBUTRIN XL) 24 hr tablet 150 mg  150 mg Oral Daily Earleen Newport, MD   150 mg at 06/29/16 1000  . cephALEXin (KEFLEX) capsule 500 mg  500 mg Oral BID Earleen Newport, MD   500 mg at 06/29/16 1003  . cholecalciferol (VITAMIN D) tablet 1,000 Units  1,000 Units Oral BID Earleen Newport, MD   1,000 Units at 06/29/16 1001  . cloNIDine (CATAPRES - Dosed in mg/24 hr) patch 0.1 mg  0.1 mg Transdermal Weekly Earleen Newport, MD   0.1 mg at 06/28/16 1557  . darifenacin (ENABLEX) 24 hr tablet 7.5 mg  7.5 mg Oral Daily Earleen Newport, MD   7.5 mg at 06/29/16 1000  . diphenhydrAMINE (BENADRYL) capsule 50 mg  50 mg Oral Daily Earleen Newport, MD   50 mg at 06/29/16 0959  . divalproex (DEPAKOTE) DR tablet 750 mg  750 mg Oral BID Earleen Newport, MD   750 mg at 06/29/16 1002  . docusate sodium (COLACE) capsule 200 mg  200 mg Oral Daily Earleen Newport, MD   200 mg at 06/29/16 1003  . ferrous sulfate tablet 325 mg  325 mg Oral Daily Earleen Newport, MD   325 mg at 06/29/16 1004  . hydrochlorothiazide (HYDRODIURIL) tablet 25 mg  25 mg Oral Daily Earleen Newport, MD   25 mg at 06/29/16 1000  . lithium carbonate capsule 300 mg  300 mg Oral QHS Earleen Newport, MD   300 mg at 06/28/16 2128  . LORazepam (ATIVAN) tablet 1 mg  1 mg Oral BID Earleen Newport, MD   1 mg at 06/29/16 6195  . meclizine (ANTIVERT) tablet 25 mg  25 mg Oral TID PRN Earleen Newport, MD      . naltrexone (DEPADE) tablet 50 mg  50 mg Oral Daily Earleen Newport, MD   50 mg at 06/29/16 0959  . pantoprazole (PROTONIX) EC tablet 40 mg  40 mg Oral Daily Earleen Newport, MD   40 mg at 06/29/16 1004  . PARoxetine (PAXIL) tablet 20 mg  20 mg Oral Daily Earleen Newport, MD   20 mg at 06/29/16 1001  . polyethylene glycol (MIRALAX / GLYCOLAX) packet 17 g  17 g Oral QODAY Earleen Newport, MD      . QUEtiapine (SEROQUEL) tablet 50 mg  50 mg Oral Q6H PRN Earleen Newport, MD       Current Outpatient Prescriptions  Medication Sig Dispense Refill  . acetaminophen (TYLENOL) 325 MG tablet Take 325 mg by mouth every 6 (  six) hours as needed for mild pain, fever or headache.    . ARIPiprazole (ABILIFY) 15 MG tablet Take 15 mg by mouth daily at 12 noon.    . Asenapine Maleate (SAPHRIS) 10 MG SUBL Place 1 tablet under the tongue daily.     . benztropine (COGENTIN) 1 MG tablet Take 1 tablet (1 mg total) by mouth 2 (two) times daily. (Patient taking differently: Take 0.5-1 mg by mouth 2 (two) times daily. ) 60 tablet 0  . buPROPion (WELLBUTRIN XL) 150 MG 24 hr tablet Take 150 mg by mouth daily.    . cephALEXin (KEFLEX) 500 MG capsule Take 1 capsule (500 mg total) by mouth 2 (two) times daily. 14 capsule 0  . cholecalciferol (VITAMIN D) 1000 UNITS tablet Take 1,000 Units by mouth 2 (two) times daily.    . cloNIDine (CATAPRES - DOSED IN MG/24 HR) 0.1 mg/24hr patch Place 0.1 mg onto the skin once a week.    . diphenhydrAMINE (BENADRYL) 50 MG capsule Take 1 capsule (50 mg total) by mouth daily. 14 capsule 0  . divalproex (DEPAKOTE SPRINKLE) 125 MG capsule Take 750 mg by mouth 2 (two) times daily.     Marland Kitchen docusate sodium (COLACE) 100 MG capsule Take 200 mg by mouth daily.     . ferrous sulfate 325 (65 FE) MG tablet Take 325 mg by mouth daily.    . hydrochlorothiazide (HYDRODIURIL) 25 MG tablet Take 25 mg by mouth daily.    Marland Kitchen lithium carbonate 300 MG capsule Take 1 capsule (300 mg total) by mouth at bedtime. (Patient taking differently: Take by mouth at  bedtime. ) 20 capsule 0  . LORazepam (ATIVAN) 1 MG tablet Take 1 mg by mouth 2 (two) times daily.    . meclizine (ANTIVERT) 25 MG tablet Take 25 mg by mouth 3 (three) times daily as needed for dizziness.    . medroxyPROGESTERone (DEPO-PROVERA) 150 MG/ML injection Inject 150 mg into the muscle every 3 (three) months.    . naltrexone (DEPADE) 50 MG tablet Take 50 mg by mouth daily.    . Olopatadine HCl (PATADAY) 0.2 % SOLN Apply 1 drop to eye daily as needed (for allergies).     Marland Kitchen omeprazole (PRILOSEC) 20 MG capsule Take 20 mg by mouth daily.    Marland Kitchen PARoxetine (PAXIL) 20 MG tablet Take 20 mg by mouth daily.    . polyethylene glycol (MIRALAX / GLYCOLAX) packet Take 17 g by mouth every other day.    Marland Kitchen QUEtiapine (SEROQUEL) 50 MG tablet Take 1 tablet (50 mg total) by mouth every 6 (six) hours as needed. (Patient taking differently: Take 50 mg by mouth 3 (three) times daily. )    . solifenacin (VESICARE) 10 MG tablet Take 10 mg by mouth daily.    . valACYclovir (VALTREX) 1000 MG tablet Take 2,000 mg by mouth 2 (two) times daily as needed (for fever blisters).      Musculoskeletal: Strength & Muscle Tone: within normal limits Gait & Station: normal Patient leans: N/A  Psychiatric Specialty Exam: Physical Exam  Nursing note and vitals reviewed. Constitutional: She appears well-developed and well-nourished.  HENT:  Head: Normocephalic and atraumatic.  Eyes: Conjunctivae are normal. Pupils are equal, round, and reactive to light.  Neck: Normal range of motion.  Cardiovascular: Normal heart sounds.   Respiratory: Effort normal.  GI: Soft.  Musculoskeletal: Normal range of motion.  Neurological: She is alert.  Skin: Skin is warm and dry.  Psychiatric: Her behavior is normal. Her  affect is labile. Her speech is tangential. Thought content is not paranoid. She expresses impulsivity. She expresses no homicidal and no suicidal ideation.    Review of Systems  Constitutional: Negative.   HENT:  Negative.   Eyes: Negative.   Respiratory: Negative.   Cardiovascular: Negative.   Gastrointestinal: Negative.   Musculoskeletal: Negative.   Skin: Negative.   Neurological: Negative.   Psychiatric/Behavioral: Negative for depression, hallucinations, memory loss, substance abuse and suicidal ideas. The patient is not nervous/anxious and does not have insomnia.     Blood pressure (!) 105/48, pulse 97, temperature 98.2 F (36.8 C), temperature source Oral, resp. rate 18, height 5' 2"  (1.575 m), weight 72.6 kg (160 lb), last menstrual period 05/28/2016, SpO2 100 %.Body mass index is 29.26 kg/m.  General Appearance: Fairly Groomed  Eye Contact:  Fair  Speech:  Slow  Volume:  Decreased  Mood:  Euthymic  Affect:  Labile  Thought Process:  Goal Directed  Orientation:  Other:  Partial  Thought Content:  Tangential  Suicidal Thoughts:  No  Homicidal Thoughts:  No  Memory:  Immediate;   Fair Recent;   Poor Remote;   Poor  Judgement:  Impaired  Insight:  Shallow  Psychomotor Activity:  Decreased  Concentration:  Concentration: Poor  Recall:  [place] of Knowledge:  Fair  Language:  Fair  Akathisia:  No  Handed:  Right  AIMS (if indicated):     Assets:  Financial Resources/Insurance Resilience  ADL's:  Impaired  Cognition:  Impaired,  Mild and Moderate  Sleep:        Treatment Plan Summary: Plan For what ever reason when she is not in a contained setting in the hospital the patient has been unable to control her impulsivity and has not been in a living situation where they have been able to manage her. We are working on trying to find some kind of placement situation. No indication to change medicine. Medicine changes are not likely to make any change to her current behavior which is calm and appropriate.  Disposition: Supportive therapy provided about ongoing stressors.  Alethia Berthold, MD 06/29/2016 12:34 PM

## 2016-06-30 LAB — LITHIUM LEVEL: Lithium Lvl: 0.31 mmol/L — ABNORMAL LOW (ref 0.60–1.20)

## 2016-06-30 MED ORDER — ASENAPINE MALEATE 5 MG SL SUBL
SUBLINGUAL_TABLET | SUBLINGUAL | Status: AC
  Administered 2016-06-30: 10 mg via SUBLINGUAL
  Filled 2016-06-30: qty 2

## 2016-06-30 NOTE — ED Notes (Signed)
BEHAVIORAL HEALTH ROUNDING Patient sleeping: No. Patient alert and oriented:  Alert and oriented to self and place  Behavior appropriate: Yes.  ; If no, describe:  Nutrition and fluids offered: yes Toileting and hygiene offered: Yes  Sitter present: q15 minute observations and security monitoring Law enforcement present: Yes  ODS  

## 2016-06-30 NOTE — ED Notes (Signed)
ED  PLACEMENT  Is the patient under IVC or is there intent for IVC: Yes.   Is the patient medically cleared: Yes.   Is there vacancy in the ED BHU: Yes.   Is the population mix appropriate for patient: Yes.   Is the patient awaiting placement in inpatient or outpatient setting: Yes.  Group home placement  Has the patient had a psychiatric consult: Yes.   Survey of unit performed for contraband, proper placement and condition of furniture, tampering with fixtures in bathroom, shower, and each patient room: Yes.  ; Findings:  APPEARANCE/BEHAVIOR Calm and cooperative NEURO ASSESSMENT Orientation: oriented x2  Self and place  Denies pain Hallucinations: No.None noted (Hallucinations) Speech: Normal Gait: normal RESPIRATORY ASSESSMENT Even  Unlabored respirations  CARDIOVASCULAR ASSESSMENT Pulses equal   regular rate  Skin warm and dry   GASTROINTESTINAL ASSESSMENT no GI complaint EXTREMITIES Full ROM  PLAN OF CARE Provide calm/safe environment. Vital signs assessed twice daily. ED BHU Assessment once each 12-hour shift. Collaborate with TTS daily or as condition indicates. Assure the ED provider has rounded once each shift. Provide and encourage hygiene. Provide redirection as needed. Assess for escalating behavior; address immediately and inform ED provider.  Assess family dynamic and appropriateness for visitation as needed: Yes.  ; If necessary, describe findings:  Educate the patient/family about BHU procedures/visitation: Yes.  ; If necessary, describe findings:

## 2016-06-30 NOTE — ED Notes (Signed)
She is sitting up on the bed coloring at this time  Pt observed with no unusual behavior  Appropriate to stimulation  No verbalized needs or concerns at this time  NAD assessed  Continue to monitor

## 2016-06-30 NOTE — ED Notes (Signed)
Patient in shower 

## 2016-06-30 NOTE — ED Notes (Signed)
BEHAVIORAL HEALTH ROUNDING Patient sleeping: No. Patient alert and oriented: oriented to self and place Behavior appropriate: Yes.  ; If no, describe:  Nutrition and fluids offered: yes Toileting and hygiene offered: Yes  She is starting her shower now Sitter present: q15 minute observations and security monitoring Law enforcement present: Yes  ODS  ENVIRONMENTAL ASSESSMENT Potentially harmful objects out of patient reach: Yes.   Personal belongings secured: Yes.   Patient dressed in hospital provided attire only: Yes.   Plastic bags out of patient reach: Yes.   Patient care equipment (cords, cables, call bells, lines, and drains) shortened, removed, or accounted for: Yes.   Equipment and supplies removed from bottom of stretcher: Yes.   Potentially toxic materials out of patient reach: Yes.   Sharps container removed or out of patient reach: Yes.

## 2016-06-30 NOTE — ED Provider Notes (Signed)
-----------------------------------------   7:33 AM on 06/30/2016 -----------------------------------------   Blood pressure (!) 101/53, pulse 94, temperature 98.6 F (37 C), temperature source Oral, resp. rate 18, height 5\' 2"  (1.575 m), weight 160 lb (72.6 kg), last menstrual period 05/28/2016, SpO2 100 %.  The patient had no acute events since last update.  Calm and cooperative at this time.  Disposition is pending per Psychiatry/Behavioral Medicine team recommendations.     Hannah Sicklearolina Georgios Kina, MD 06/30/16 [phone]

## 2016-06-30 NOTE — ED Notes (Signed)
Breakfast was given to patient. 

## 2016-06-30 NOTE — Progress Notes (Addendum)
LCSW was called to Lobby by ed nurse and there were 5 people from [facility] and patient  group Home Provider and patient Guardian for a case conference unplanned  People Present : LCSW   LCSW was NOT informed this was taking place today at 12 noon to 1pm however LCSW was able to listen to all parties and record their concerns.  The purpose of the meeting was to inform Dr Weber Cooks that patient needs to take her medication as prescribed and with applesauce, in addition we are not ( nurses, SW, [employer] support, staff to "coddle the patient" we are to be professional and removed . Pts psychologist will send over list of behavior strategies. ( Not received yet ) To  Ensure patients needs are met LCSW will review with charge nurse these concerns.  Group home provider indicated patient while home for 2 days ,has a weird twitch in her neck and she is concerned this is toxicity. LCSW and  Dr Weber Cooks  consulted and he reported her lithium levels were checked and patient has no toxicity issues.    Guardian wanted pass code so her care coordinators could call and follow up. ( LCSW provided it)  LCSW did question why patient's does not have additional supports to redirect her from coming into hospital and [place] provider spoke to me about some changes that are in place. The cardinal care coordinater is willing to look at [facility] in the near future. They are not prepared to take patient back.  Meeting concluded in a hour.

## 2016-06-30 NOTE — ED Notes (Signed)
BEHAVIORAL HEALTH ROUNDING Patient sleeping: No. Patient alert and oriented:  Alert and oriented to self and place  Behavior appropriate: Yes.  ; If no, describe:  Nutrition and fluids offered: yes Toileting and hygiene offered: Yes  Sitter present: q15 minute observations and security  monitoring Law enforcement present: Yes  ODS  meds administered as ordered  Assessment completed

## 2016-06-30 NOTE — ED Provider Notes (Signed)
-----------------------------------------   1:28 PM on 06/30/2016 -----------------------------------------   Blood pressure (!) 101/53, pulse 94, temperature 98.6 F (37 C), temperature source Oral, resp. rate 18, height 5\' 2"  (1.575 m), weight 160 lb (72.6 kg), last menstrual period 05/28/2016, SpO2 100 %.  Patient's HCPOA was here this afternoon to visit patient and she is concerned that patient might have lithium toxicity. According to her patient had an episode of lithium toxicity requiring HD in 2016 and patient was suppose to be off of it since October 2017. [facility], patient is showing involuntary movements and lack of coordination which are not patient's baseline and were present when she had toxic levels of lithim. Lithium leves were last checked on 06/01/16 and were low at 0.39. Patient is currently on lithium 300mg  qHS. I have just ordered repeat lithium levels and I will discuss this conversation and concerns with Dr. Toni Amendlapacs.  3:00 PM Dr Toni Amendlapacs aware. Recommended stopping lithium if level is elevated. Otherwise recommended not changing meds. Level is pending at the end of my shift. Signed out to Dr. Derrill KayGoodman.   Hannah Sicklearolina Juvencio Verdi, MD 07/02/16 [phone]

## 2016-07-01 MED ORDER — ASENAPINE MALEATE 5 MG SL SUBL
SUBLINGUAL_TABLET | SUBLINGUAL | Status: AC
  Administered 2016-07-01: 10 mg via SUBLINGUAL
  Filled 2016-07-01: qty 2

## 2016-07-01 NOTE — ED Notes (Signed)
BEHAVIORAL HEALTH ROUNDING Patient sleeping: No. Patient alert and oriented: yes Behavior appropriate: Yes.  ; If no, describe:  Nutrition and fluids offered: Yes  Toileting and hygiene offered: Yes  Sitter present: not applicable Law enforcement present: Yes  

## 2016-07-01 NOTE — ED Notes (Signed)
Patient in room quietly drawing pictures. NAD.

## 2016-07-01 NOTE — ED Notes (Signed)
BEHAVIORAL HEALTH ROUNDING Patient sleeping: Yes.   Patient alert and oriented: yes Behavior appropriate: Yes.  ; If no, describe:  Nutrition and fluids offered: Yes  Toileting and hygiene offered: Yes  Sitter present: no Law enforcement present: Yes  

## 2016-07-01 NOTE — ED Notes (Signed)
Patient given clean change of clothes

## 2016-07-01 NOTE — ED Provider Notes (Signed)
-----------------------------------------   6:35 AM on 07/01/2016 -----------------------------------------   Blood pressure (!) 99/52, pulse 97, temperature 98.2 F (36.8 C), temperature source Oral, resp. rate 18, height 5\' 2"  (1.575 m), weight 160 lb (72.6 kg), last menstrual period 05/28/2016, SpO2 99 %.  The patient had no acute events since last update.  Calm and cooperative at this time.  Disposition is pending Psychiatry/Behavioral Medicine team recommendations.     Irean HongJade J Joyanne Eddinger, MD 07/01/16 [phone]

## 2016-07-01 NOTE — ED Notes (Signed)

## 2016-07-01 NOTE — Progress Notes (Signed)
Called [facility] spoke to Joy to check on patient on wait list, no beds   Jomarion Mish Heritage HillsBandi LCSW [phone]

## 2016-07-01 NOTE — ED Notes (Signed)
Pt c/o tingling "like pins and needles" bilateral hands between thumb and forefinger, and back of neck, Dr Dolores FrameSung Notified and meds reviewed, pt returned to sleep

## 2016-07-01 NOTE — ED Notes (Signed)
Pt states pins and needles sensation is gone after seroquel administration. Pt states "i'm going to sleep now, that made me all better." resps unlabored, pt moving all extremities without difficulty.

## 2016-07-01 NOTE — ED Notes (Signed)
Pt on the phone with her aunt

## 2016-07-01 NOTE — ED Notes (Signed)
Alternately visiting with staff and coloring. Cooperative calm behavior.

## 2016-07-01 NOTE — ED Notes (Signed)
BEHAVIORAL HEALTH ROUNDING Patient sleeping: No. Patient alert and oriented: yes Behavior appropriate: Yes.  ; If no, describe:  Nutrition and fluids offered: Yes  Toileting and hygiene offered: Yes  Sitter present: no Law enforcement present: Yes  

## 2016-07-01 NOTE — ED Notes (Signed)
Pt was given a sprite

## 2016-07-02 MED ORDER — ASENAPINE MALEATE 5 MG SL SUBL
10.0000 mg | SUBLINGUAL_TABLET | Freq: Every day | SUBLINGUAL | Status: DC
  Administered 2016-07-02: 10 mg via SUBLINGUAL
  Administered 2016-07-03: 5 mg via SUBLINGUAL
  Administered 2016-07-04 – 2016-07-07 (×4): 10 mg via SUBLINGUAL
  Filled 2016-07-02 (×6): qty 2

## 2016-07-02 NOTE — ED Provider Notes (Signed)
-----------------------------------------   6:07 AM on 07/02/2016 -----------------------------------------   Blood pressure (!) 109/94, pulse (!) 108, temperature 98.7 F (37.1 C), temperature source Oral, resp. rate 16, height 5\' 2"  (1.575 m), weight 160 lb (72.6 kg), last menstrual period 05/28/2016, SpO2 100 %.  The patient had no acute events since last update.  Calm and cooperative at this time.  Disposition is pending Psychiatry/Behavioral Medicine team recommendations.     Myrna Blazeravid Matthew Schaevitz, MD 07/02/16 [phone]

## 2016-07-02 NOTE — ED Notes (Signed)
Patient given meal tray.

## 2016-07-02 NOTE — ED Notes (Signed)
PT IVC PENDING PLACEMENT/IVC PAPERS TO BE RENEWED ON 07/03/2016 @ 10:42PM

## 2016-07-03 MED ORDER — DIVALPROEX SODIUM 250 MG PO DR TAB
DELAYED_RELEASE_TABLET | ORAL | Status: AC
  Administered 2016-07-03: 750 mg via ORAL
  Filled 2016-07-03: qty 3

## 2016-07-03 NOTE — ED Provider Notes (Signed)
-----------------------------------------   7:28 AM on 07/03/2016 -----------------------------------------   Blood pressure 114/64, pulse (!) 101, temperature 98.3 F (36.8 C), temperature source Oral, resp. rate 18, height 5\' 2"  (1.575 m), weight 160 lb (72.6 kg), last menstrual period 05/28/2016, SpO2 100 %.  The patient had no acute events since last update.  Calm and cooperative at this time.  Disposition is pending Psychiatry/Behavioral Medicine team recommendations.      Irean HongJade J Sung, MD 07/03/16 [phone]

## 2016-07-03 NOTE — ED Notes (Signed)
Report from shannin, rn.  

## 2016-07-03 NOTE — Progress Notes (Signed)
Clinical Social Worker (CSW) contacted [facility] to check status of referral. Per [facility] patient is on the waiting list however there are no beds available.   Baker Hughes IncorporatedBailey Adolph Clutter, LCSW [phone]

## 2016-07-03 NOTE — ED Notes (Signed)
Pt sleeping, resps unlabored.  

## 2016-07-03 NOTE — ED Notes (Signed)
Pt given cup or sprite,graham crackers and peanut butter.

## 2016-07-03 NOTE — ED Notes (Signed)
IVC/Pending placement 

## 2016-07-03 NOTE — ED Notes (Signed)
Received report from April RN.

## 2016-07-04 NOTE — ED Notes (Signed)
Pt. Noted in room sleeping;. No complaints or concerns voiced. No distress or abnormal behavior noted. Will continue to monitor with security cameras. Q 15 minute rounds continue. 

## 2016-07-04 NOTE — ED Notes (Signed)
[facility] Patient is taking a shower at this time. Bed sheets will be changed after the shower.

## 2016-07-04 NOTE — ED Provider Notes (Signed)
-----------------------------------------   8:02 AM on 07/04/2016 -----------------------------------------   Blood pressure 119/90, pulse 99, temperature 98.4 F (36.9 C), temperature source Oral, resp. rate 16, height 5\' 2"  (1.575 m), weight 160 lb (72.6 kg), last menstrual period 05/28/2016, SpO2 100 %.  The patient had no acute events since last update.  Calm and cooperative at this time. [facility]     Rebecka ApleyAllison P Zamari Bonsall, MD 07/04/16 [phone]

## 2016-07-04 NOTE — BH Assessment (Signed)
07/04/16, 1630.  TTS called to lobby to speak withLaura Nathanial Rancherhompson Quinn of [employer].  [phone].  TTS unable to come immediately and Ms. Vincenza HewsQuinn was gone when TTS arrived, however Ms. Vincenza HewsQuinn did speak with pt's RN, who shared the following info:  Ms. Vincenza HewsQuinn has worked with pt before and has located a possible hospital placement for [facility] [place].  Ms. Vincenza HewsQuinn also provided several documents, which are in TTS office: Behavior support plan and suggestions for the hospital.  Contact at [facility] is [place], [phone].  Fax: [phone].  Christiane HaJonathan contacted and does have available IDD beds.  All requested referral info faxed to Christiane HaJonathan, who confirmed that he had received them and that pt was under review by the MD at this time. Daleen SquibbGreg Daisuke Bailey, LCSW

## 2016-07-04 NOTE — ED Notes (Signed)
Pt wandering into hallway, attempting to enter other pt's rooms. Redirected by ODS. Pt requiring multiple conversations to redirect her to her room. EDT offering to take pt to shower at this time.

## 2016-07-04 NOTE — Consult Note (Signed)
[facility] Face-to-Face Psychiatry Consult   Reason for Consult:  Consult for 25 year old woman follow-up who has schizoaffective disorder and developmental disability Referring Physician:  Scotty Court Patient Identification: Hannah Keller MRN:  [id number] Principal Diagnosis: Mental retardation, idiopathic moderate Diagnosis:   Patient Active Problem List   Diagnosis Date Noted  . Seizures (HCC) [R56.9] 05/22/2016  . 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 Paranoid schizophrenia (HCC) [F20.0] 04/12/2016  . Acute renal failure syndrome (HCC) [N17.9]   . 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 Sinus pause [I45.5]   . Lithium toxicity [T56.891A] 03/29/2015  . 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 female patient admitted with "I'm okay".  Follow-up for Tuesday, December 5. No new complaints. No changes to behavior. Medically stable. No change to clinical situation at all.  HPI:  This is a follow-up for this 25 year old woman with developmental disability and schizoaffective disorder. She has been in the hospital for an extended period of time because of repeated problems with her behavior. Since being back in the emergency room we have made some medicine alterations. Her behavior has generally been pretty calm and manageable. Occasionally gets slightly loud but has not been violent or threatening. Today we received word from her group home that they are willing to give her another try with discharge because her behavior has improved.  Past Psychiatric History: Long-standing problems with behavior related to her developmental disability. Diagnosis schizoaffective disorder as well  Risk to Self: Suicidal Ideation: No Suicidal Intent: No Is patient at risk for suicide?: No Suicidal Plan?: No Specify Current Suicidal Plan: none Access to Means: No What has been your use of drugs/alcohol within the last 12  months?: none How many times?: 0 Other Self Harm Risks: none noted Triggers for Past Attempts: Unpredictable Intentional Self Injurious Behavior: None Risk to Others: Homicidal Ideation: No Thoughts of Harm to Others: No Current Homicidal Intent: No Current Homicidal Plan: No Access to Homicidal Means: No Identified Victim: none History of harm to others?: No Assessment of Violence: In past 6-12 months Violent Behavior Description: Fights with others (fights with others) Does patient have access to weapons?: No Criminal Charges Pending?: No Does patient have a court date: No Prior Inpatient Therapy: Prior Inpatient Therapy: No (pt denies. Was last seen [facility] not admitted) Prior Therapy Dates: unknown Prior Therapy Facilty/Provider(s): unknown Reason for Treatment: Schizophrenia Prior Outpatient Therapy: Prior Outpatient Therapy: Yes Prior Therapy Dates: current Prior Therapy Facilty/Provider(s): Provided by Group Home Reason for Treatment: schizophrenia Does patient have an ACCT team?: Unknown Does patient have Intensive In-House Services?  : No Does patient have [facility] services? : No Does patient have P4CC services?: No  Past Medical History:  Past Medical History:  Diagnosis Date  . Developmental delay   . Mental retardation    Mild  . Paranoid schizophrenia (HCC)   . Seizures (HCC)     Past Surgical History:  Procedure Laterality Date  . ESOPHAGOGASTRODUODENOSCOPY (EGD) WITH PROPOFOL N/A 05/18/2015   Procedure: ESOPHAGOGASTRODUODENOSCOPY (EGD) WITH PROPOFOL;  Surgeon: Christena Deem, MD;  Location: [facility] ENDOSCOPY;  Service: Endoscopy;  Laterality: N/A;   Family History:  Family History  Problem Relation Age of Onset  . Family history unknown: Yes   Family Psychiatric  History: Unknown Social History:  History  Alcohol Use No     History  Drug Use No    Social History  Social History  . Marital status: Single    Spouse name: N/A  . Number of  children: 0  . Years of education: n/a   Occupational History  . Disabled    Social History Main Topics  . Smoking status: Never Smoker  . Smokeless tobacco: Never Used  . Alcohol use No  . Drug use: No  . Sexual activity: Not Asked   Other Topics Concern  . None   Social History Narrative   Pt currently resides at [facility].   Caffeine Use: None   Additional Social History:    Allergies:   Allergies  Allergen Reactions  . Ritalin [Methylphenidate Hcl] Other (See Comments)    Reaction:  Unknown     Labs:  No results found for this or any prBJ'sevious visit (from the past 48 hour(s)).  Current Facility-Administered Medications  Medication Dose Route Frequency Provider Last Rate Last Dose  . ARIPiprazole (ABILIFY) tablet 15 mg  15 mg Oral Q1200 Emily FilbertJonathan E Williams, MD   15 mg at 07/04/16 1215  . asenapine (SAPHRIS) sublingual tablet 10 mg  10 mg Sublingual Daily Sheema M Hallaji, RPH   10 mg at 07/04/16 1015  . benztropine (COGENTIN) tablet 1 mg  1 mg Oral BID Emily FilbertJonathan E Williams, MD   1 mg at 07/04/16 1013  . buPROPion (WELLBUTRIN XL) 24 hr tablet 150 mg  150 mg Oral Daily Emily FilbertJonathan E Williams, MD   150 mg at 07/04/16 1012  . cephALEXin (KEFLEX) capsule 500 mg  500 mg Oral BID Emily FilbertJonathan E Williams, MD   500 mg at 07/04/16 1015  . cholecalciferol (VITAMIN D) tablet 1,000 Units  1,000 Units Oral BID Emily FilbertJonathan E Williams, MD   1,000 Units at 07/04/16 1014  . cloNIDine (CATAPRES - Dosed in mg/24 hr) patch 0.1 mg  0.1 mg Transdermal Weekly Emily FilbertJonathan E Williams, MD   0.1 mg at 06/28/16 1557  . darifenacin (ENABLEX) 24 hr tablet 7.5 mg  7.5 mg Oral Daily Emily FilbertJonathan E Williams, MD   7.5 mg at 07/04/16 1013  . diphenhydrAMINE (BENADRYL) capsule 50 mg  50 mg Oral Daily Emily FilbertJonathan E Williams, MD   50 mg at 07/04/16 1014  . divalproex (DEPAKOTE) DR tablet 750 mg  750 mg Oral BID Emily FilbertJonathan E Williams, MD   750 mg at 07/04/16 1014  . docusate sodium (COLACE) capsule 200 mg  200 mg Oral  Daily Emily FilbertJonathan E Williams, MD   200 mg at 07/04/16 1013  . ferrous sulfate tablet 325 mg  325 mg Oral Daily Emily FilbertJonathan E Williams, MD   325 mg at 07/04/16 1015  . hydrochlorothiazide (HYDRODIURIL) tablet 25 mg  25 mg Oral Daily Emily FilbertJonathan E Williams, MD   25 mg at 07/04/16 1013  . lithium carbonate capsule 300 mg  300 mg Oral QHS Emily FilbertJonathan E Williams, MD   300 mg at 07/03/16 2106  . LORazepam (ATIVAN) tablet 1 mg  1 mg Oral BID Emily FilbertJonathan E Williams, MD   1 mg at 07/04/16 1013  . meclizine (ANTIVERT) tablet 25 mg  25 mg Oral TID PRN Emily FilbertJonathan E Williams, MD      . naltrexone (DEPADE) tablet 50 mg  50 mg Oral Daily Emily FilbertJonathan E Williams, MD   50 mg at 07/04/16 1015  . pantoprazole (PROTONIX) EC tablet 40 mg  40 mg Oral Daily Emily FilbertJonathan E Williams, MD   40 mg at 07/04/16 1013  . PARoxetine (PAXIL) tablet 20 mg  20 mg Oral Daily Cecille AmsterdamJonathan E  Mayford Knife, MD   20 mg at 07/04/16 1015  . polyethylene glycol (MIRALAX / GLYCOLAX) packet 17 g  17 g Oral QODAY Emily Filbert, MD   17 g at 07/04/16 1015  . QUEtiapine (SEROQUEL) tablet 50 mg  50 mg Oral Q6H PRN Emily Filbert, MD   50 mg at 07/01/16 0016   Current Outpatient Prescriptions  Medication Sig Dispense Refill  . acetaminophen (TYLENOL) 325 MG tablet Take 325 mg by mouth every 6 (six) hours as needed for mild pain, fever or headache.    . ARIPiprazole (ABILIFY) 15 MG tablet Take 15 mg by mouth daily at 12 noon.    . Asenapine Maleate (SAPHRIS) 10 MG SUBL Place 1 tablet under the tongue daily.     . benztropine (COGENTIN) 1 MG tablet Take 1 tablet (1 mg total) by mouth 2 (two) times daily. (Patient taking differently: Take 0.5-1 mg by mouth 2 (two) times daily. ) 60 tablet 0  . buPROPion (WELLBUTRIN XL) 150 MG 24 hr tablet Take 150 mg by mouth daily.    . cephALEXin (KEFLEX) 500 MG capsule Take 1 capsule (500 mg total) by mouth 2 (two) times daily. 14 capsule 0  . cholecalciferol (VITAMIN D) 1000 UNITS tablet Take 1,000 Units by mouth 2 (two) times daily.     . cloNIDine (CATAPRES - DOSED IN MG/24 HR) 0.1 mg/24hr patch Place 0.1 mg onto the skin once a week.    . diphenhydrAMINE (BENADRYL) 50 MG capsule Take 1 capsule (50 mg total) by mouth daily. 14 capsule 0  . divalproex (DEPAKOTE SPRINKLE) 125 MG capsule Take 750 mg by mouth 2 (two) times daily.     Marland Kitchen docusate sodium (COLACE) 100 MG capsule Take 200 mg by mouth daily.     . ferrous sulfate 325 (65 FE) MG tablet Take 325 mg by mouth daily.    . hydrochlorothiazide (HYDRODIURIL) 25 MG tablet Take 25 mg by mouth daily.    Marland Kitchen lithium carbonate 300 MG capsule Take 1 capsule (300 mg total) by mouth at bedtime. (Patient taking differently: Take by mouth at bedtime. ) 20 capsule 0  . LORazepam (ATIVAN) 1 MG tablet Take 1 mg by mouth 2 (two) times daily.    . meclizine (ANTIVERT) 25 MG tablet Take 25 mg by mouth 3 (three) times daily as needed for dizziness.    . medroxyPROGESTERone (DEPO-PROVERA) 150 MG/ML injection Inject 150 mg into the muscle every 3 (three) months.    . naltrexone (DEPADE) 50 MG tablet Take 50 mg by mouth daily.    . Olopatadine HCl (PATADAY) 0.2 % SOLN Apply 1 drop to eye daily as needed (for allergies).     Marland Kitchen omeprazole (PRILOSEC) 20 MG capsule Take 20 mg by mouth daily.    Marland Kitchen PARoxetine (PAXIL) 20 MG tablet Take 20 mg by mouth daily.    . polyethylene glycol (MIRALAX / GLYCOLAX) packet Take 17 g by mouth every other day.    Marland Kitchen QUEtiapine (SEROQUEL) 50 MG tablet Take 1 tablet (50 mg total) by mouth every 6 (six) hours as needed. (Patient taking differently: Take 50 mg by mouth 3 (three) times daily. )    . solifenacin (VESICARE) 10 MG tablet Take 10 mg by mouth daily.    . valACYclovir (VALTREX) 1000 MG tablet Take 2,000 mg by mouth 2 (two) times daily as needed (for fever blisters).      Musculoskeletal: Strength & Muscle Tone: within normal limits Gait & Station: normal Patient leans:  N/A  Psychiatric Specialty Exam: Physical Exam  Nursing note and vitals  reviewed. Constitutional: She appears well-developed and well-nourished.  HENT:  Head: Normocephalic and atraumatic.  Eyes: Conjunctivae are normal. Pupils are equal, round, and reactive to light.  Neck: Normal range of motion.  Cardiovascular: Normal heart sounds.   Respiratory: Effort normal.  GI: Soft.  Musculoskeletal: Normal range of motion.  Neurological: She is alert.  Skin: Skin is warm and dry.  Psychiatric: Her behavior is normal. Her affect is labile. Her speech is tangential. Thought content is not paranoid. She expresses impulsivity. She expresses no homicidal and no suicidal ideation.    Review of Systems  Constitutional: Negative.   HENT: Negative.   Eyes: Negative.   Respiratory: Negative.   Cardiovascular: Negative.   Gastrointestinal: Negative.   Musculoskeletal: Negative.   Skin: Negative.   Neurological: Negative.   Psychiatric/Behavioral: Negative for depression, hallucinations, memory loss, substance abuse and suicidal ideas. The patient is not nervous/anxious and does not have insomnia.     Blood pressure 129/83, pulse 98, temperature 98.1 F (36.7 C), temperature source Oral, resp. rate 18, height 5\' 2"  (1.575 m), weight 72.6 kg (160 lb), last menstrual period 05/28/2016, SpO2 99 %.Body mass index is 29.26 kg/m.  General Appearance: Fairly Groomed  Eye Contact:  Fair  Speech:  Slow  Volume:  Decreased  Mood:  Euthymic  Affect:  Labile  Thought Process:  Goal Directed  Orientation:  Other:  Partial  Thought Content:  Tangential  Suicidal Thoughts:  No  Homicidal Thoughts:  No  Memory:  Immediate;   Fair Recent;   Poor Remote;   Poor  Judgement:  Impaired  Insight:  Shallow  Psychomotor Activity:  Decreased  Concentration:  Concentration: Poor  Recall:  Fair  Fund of Knowledge:  Fair  Language:  Fair  Akathisia:  No  Handed:  Right  AIMS (if indicated):     Assets:  Financial Resources/Insurance Resilience  ADL's:  Impaired  Cognition:   Impaired,  Mild and Moderate  Sleep:        Treatment Plan Summary: Plan For what ever reason when she is not in a contained setting in the hospital the patient has been unable to control her impulsivity and has not been in a living situation where they have been able to manage her. We are working on trying to find some kind of placement situation. No indication to change medicine. Medicine changes are not likely to make any change to her current behavior which is calm and appropriate.  Disposition: Supportive therapy provided about ongoing stressors.  Mordecai RasmussenJohn Kinberly Perris, MD 07/04/2016 4:14 PM

## 2016-07-04 NOTE — ED Notes (Addendum)
Pt woke to get vitals.  Changed underwear and pants tat were slightly wet.  Bedding was dry

## 2016-07-05 LAB — LITHIUM LEVEL: LITHIUM LVL: 0.16 mmol/L — AB (ref 0.60–1.20)

## 2016-07-05 MED ORDER — LORAZEPAM 0.5 MG PO TABS
ORAL_TABLET | ORAL | Status: AC
  Administered 2016-07-05: 1 mg via ORAL
  Filled 2016-07-05: qty 2

## 2016-07-05 NOTE — ED Notes (Signed)
Resumed care from Verizonjennifer rn.  Pt ate small amount on dinner tray.  Pt alert and calm.

## 2016-07-05 NOTE — ED Notes (Signed)
Pt was very cooperative when taking medication. Pt requested medications be given with apple sauce. All medications were taken by mouth without difficulty. No other needs expressed.

## 2016-07-05 NOTE — ED Notes (Signed)
Pt eating dinner

## 2016-07-05 NOTE — ED Notes (Signed)
IVC/  PENDING  PLACEMENT 

## 2016-07-05 NOTE — ED Notes (Signed)
Breakfast was given to patient. 

## 2016-07-05 NOTE — ED Notes (Signed)
Pt states had bowel movement in her pants.

## 2016-07-05 NOTE — ED Provider Notes (Signed)
-----------------------------------------   8:17 AM on 07/05/2016 -----------------------------------------   Blood pressure 115/64, pulse 77, temperature 98.2 F (36.8 C), temperature source Oral, resp. rate 18, height 5\' 2"  (1.575 m), weight 160 lb (72.6 kg), last menstrual period 05/28/2016, SpO2 99 %.  The patient had no acute events since last update.  Calm and cooperative at this time.  Disposition is pending Psychiatry/Behavioral Medicine team recommendations.     Jeanmarie PlantJames A Jaizon Deroos, MD 07/05/16 [phone]

## 2016-07-05 NOTE — ED Notes (Signed)
Patient in shower 

## 2016-07-06 MED ORDER — BENZTROPINE MESYLATE 0.5 MG PO TABS
ORAL_TABLET | ORAL | Status: AC
  Administered 2016-07-06: 1 mg via ORAL
  Filled 2016-07-06: qty 2

## 2016-07-06 MED ORDER — DIVALPROEX SODIUM 250 MG PO DR TAB
DELAYED_RELEASE_TABLET | ORAL | Status: AC
  Administered 2016-07-06: 750 mg via ORAL
  Filled 2016-07-06: qty 1

## 2016-07-06 MED ORDER — DIVALPROEX SODIUM 500 MG PO DR TAB
DELAYED_RELEASE_TABLET | ORAL | Status: AC
  Filled 2016-07-06: qty 1

## 2016-07-06 NOTE — ED Notes (Signed)
Pt was given a cup of water with RN approval./ pt calm and cooperative at this time with no signs of distress. Pt seen sitting in bed awake listening to music. Environment is safe. Nothing is needed at this time from staff

## 2016-07-06 NOTE — ED Notes (Signed)
CSW followed up with [facility] [place] [phone]. Pt has been declined due to no IDD bed availability.   CSW called pt's care coordinator Bethena Roys(Tenethia) to inform her of above [phone]. No answer, left a message. CSW awaiting call back.  Jonathon JordanLynn B Othmar Ringer, MSW, Theresia MajorsLCSWA [phone]

## 2016-07-06 NOTE — ED Notes (Signed)
Patient given refreshment and snacks at this time.  Patient comfortable and denies any other needs at this time.

## 2016-07-06 NOTE — ED Notes (Signed)
Dinner given

## 2016-07-06 NOTE — ED Notes (Signed)
Pt ambulated to the bathroom. Pt given warm blanket by officer.

## 2016-07-06 NOTE — ED Notes (Signed)
.  [facility] 

## 2016-07-06 NOTE — ED Notes (Signed)
Pt given sandwich tray and drink. No other needs expressed.

## 2016-07-06 NOTE — ED Notes (Signed)
Patient alerted this tech to the fact that she had wet her bed while sleeping. Patient's bed mattress was disinfected, and all linens were changed. With assistance, patient wiped perianal area with CHG bath cloths and changed into clean, dry underwear, socks and scrubs.

## 2016-07-06 NOTE — ED Notes (Signed)
Lunch provided.

## 2016-07-06 NOTE — ED Provider Notes (Signed)
-----------------------------------------   7:02 AM on 07/06/2016 -----------------------------------------   Blood pressure 109/62, pulse 92, temperature 98.4 F (36.9 C), temperature source Oral, resp. rate 18, height 5\' 2"  (1.575 m), weight 160 lb (72.6 kg), SpO2 99 %.  The patient had no acute events since last update.  Calm and cooperative at this time.  Disposition is pending Psychiatry/Behavioral Medicine team recommendations.     Willy EddyPatrick Takerra Lupinacci, MD 07/06/16 [phone]

## 2016-07-06 NOTE — ED Notes (Signed)
Patient in bathroom

## 2016-07-06 NOTE — ED Notes (Signed)
Pt still calm and cooperative/ pt seen lying in bed with lights off-blinds open and listening to music.pt needs nothing at this time

## 2016-07-06 NOTE — BH Assessment (Signed)
[facility] ([phone]), confirming patient was in the ER.  She remains on their  Wait List.

## 2016-07-06 NOTE — ED Notes (Signed)
Patient took medications with applesauce. Talkative and cooperative.

## 2016-07-07 NOTE — BH Assessment (Signed)
Writer called and confirmed([phone]) patient remains on [facility] Wait List. 

## 2016-07-07 NOTE — ED Notes (Signed)
BEHAVIORAL HEALTH ROUNDING Patient sleeping: Yes.   Patient alert and oriented: eyes closed  Appears asleep Behavior appropriate: Yes.  ; If no, describe:  Nutrition and fluids offered: Yes  Toileting and hygiene offered: sleeping Sitter present: q 15 minute observations and security monitoring Law enforcement present: yes  ODS 

## 2016-07-07 NOTE — ED Notes (Signed)
BEHAVIORAL HEALTH ROUNDING Patient sleeping: No. Patient alert and oriented: yes Behavior appropriate: Yes.  ; If no, describe:  Nutrition and fluids offered: yes Toileting and hygiene offered: Yes  Sitter present: q15 minute observations and security  monitoring Law enforcement present: Yes  ODS  

## 2016-07-07 NOTE — ED Notes (Signed)
Pt given meal tray and juice. 

## 2016-07-07 NOTE — ED Notes (Signed)
ED  Is the patient under IVC or is there intent for IVC: Yes.   Is the patient medically cleared: Yes.   Is there vacancy in the ED BHU: Yes.   Is the population mix appropriate for patient: pt with mild MR - does not qualify for BHU where she could walk around more Is the patient awaiting placement in inpatient or outpatient setting: Yes.   Has the patient had a psychiatric consult: Yes.   Survey of unit performed for contraband, proper placement and condition of furniture, tampering with fixtures in bathroom, shower, and each patient room: Yes.  ; Findings:  APPEARANCE/BEHAVIOR Calm and cooperative NEURO ASSESSMENT Orientation: oriented to self and place  Denies pain Hallucinations: No.None noted (Hallucinations) Speech: Normal Gait: normal RESPIRATORY ASSESSMENT Even  Unlabored respirations  CARDIOVASCULAR ASSESSMENT Pulses equal   regular rate  Skin warm and dry   GASTROINTESTINAL ASSESSMENT no GI complaint EXTREMITIES Full ROM  PLAN OF CARE Provide calm/safe environment. Vital signs assessed twice daily. ED BHU Assessment once each 12-hour shift. Collaborate with TTS daily or as condition indicates. Assure the ED provider has rounded once each shift. Provide and encourage hygiene. Provide redirection as needed. Assess for escalating behavior; address immediately and inform ED provider.  Assess family dynamic and appropriateness for visitation as needed: Yes.  ; If necessary, describe findings:  Educate the patient/family about BHU procedures/visitation: Yes.  ; If necessary, describe findings:

## 2016-07-07 NOTE — ED Notes (Signed)
Amy,RN notified of pt bp.

## 2016-07-07 NOTE — ED Notes (Signed)
BEHAVIORAL HEALTH ROUNDING Patient sleeping: No. Patient alert and oriented:   Oriented to self and place   Behavior appropriate: Yes.  ; If no, describe:  Nutrition and fluids offered: yes Toileting and hygiene offered: Yes  Sitter present: q15 minute observations and security monitoring Law enforcement present: Yes  ODS   ENVIRONMENTAL ASSESSMENT Potentially harmful objects out of patient reach: Yes.   Personal belongings secured: Yes.   Patient dressed in hospital provided attire only: Yes.   Plastic bags out of patient reach: Yes.   Patient care equipment (cords, cables, call bells, lines, and drains) shortened, removed, or accounted for: Yes.   Equipment and supplies removed from bottom of stretcher: Yes.   Potentially toxic materials out of patient reach: Yes.   Sharps container removed or out of patient reach: Yes.   

## 2016-07-07 NOTE — ED Notes (Signed)
Pt given sprite, graham crackers and peanut butter 

## 2016-07-07 NOTE — ED Notes (Signed)
Pt given meal tray and sprite. 

## 2016-07-07 NOTE — ED Notes (Signed)
Patient observed lying in bed with eyes closed  Even, unlabored respirations observed   NAD pt appears to be sleeping  I will continue to monitor along with every 15 minute visual observations and ongoing security monitoring    

## 2016-07-07 NOTE — ED Notes (Signed)
meds adminstered as ordered  - assessment completed    BEHAVIORAL HEALTH ROUNDING Patient sleeping: No. Patient alert and oriented:  Oriented to self and place Behavior appropriate: Yes.  ; If no, describe:  Nutrition and fluids offered: yes Toileting and hygiene offered: Yes  Sitter present: q15 minute observations and security monitoring Law enforcement present: Yes  ODS

## 2016-07-08 LAB — GLUCOSE, CAPILLARY: GLUCOSE-CAPILLARY: 105 mg/dL — AB (ref 65–99)

## 2016-07-08 LAB — BASIC METABOLIC PANEL
Anion gap: 5 (ref 5–15)
BUN: 16 mg/dL (ref 6–20)
CALCIUM: 9.6 mg/dL (ref 8.9–10.3)
CHLORIDE: 106 mmol/L (ref 101–111)
CO2: 28 mmol/L (ref 22–32)
CREATININE: 1.3 mg/dL — AB (ref 0.44–1.00)
GFR calc Af Amer: >60 mL/min (ref >60)
GFR calc non Af Amer: 57 mL/min — ABNORMAL LOW (ref >60)
GLUCOSE: 73 mg/dL (ref 65–99)
Potassium: 3.3 mmol/L — ABNORMAL LOW (ref 3.5–5.1)
Sodium: 139 mmol/L (ref 135–145)

## 2016-07-08 LAB — CBC
HCT: 37.8 % (ref 35.0–47.0)
HEMOGLOBIN: 12.9 g/dL (ref 12.0–16.0)
MCH: 31.4 pg (ref 26.0–34.0)
MCHC: 34.2 g/dL (ref 32.0–36.0)
MCV: 91.7 fL (ref 80.0–100.0)
PLATELETS: 223 10*3/uL (ref 150–440)
RBC: 4.12 MIL/uL (ref 3.80–5.20)
RDW: 12.6 % (ref 11.5–14.5)
WBC: 11 10*3/uL (ref 3.6–11.0)

## 2016-07-08 MED ORDER — HALOPERIDOL LACTATE 5 MG/ML IJ SOLN
20.0000 mg | Freq: Once | INTRAMUSCULAR | Status: AC
  Administered 2016-07-08: 20 mg via INTRAMUSCULAR
  Filled 2016-07-08: qty 4

## 2016-07-08 MED ORDER — LORAZEPAM 2 MG/ML IJ SOLN
INTRAMUSCULAR | Status: AC
  Administered 2016-07-08: 13:00:00
  Filled 2016-07-08: qty 1

## 2016-07-08 MED ORDER — LORAZEPAM 2 MG/ML IJ SOLN
2.0000 mg | Freq: Once | INTRAMUSCULAR | Status: AC
  Administered 2016-07-08: 2 mg via INTRAMUSCULAR
  Filled 2016-07-08: qty 1

## 2016-07-08 MED ORDER — MIDAZOLAM HCL 5 MG/5ML IJ SOLN
4.0000 mg | Freq: Once | INTRAMUSCULAR | Status: AC
  Administered 2016-07-08: 4 mg via INTRAVENOUS

## 2016-07-08 MED ORDER — MIDAZOLAM HCL 5 MG/5ML IJ SOLN
INTRAMUSCULAR | Status: AC
  Administered 2016-07-08: 4 mg via INTRAVENOUS
  Filled 2016-07-08: qty 5

## 2016-07-08 MED ORDER — HALOPERIDOL LACTATE 5 MG/ML IJ SOLN
INTRAMUSCULAR | Status: AC
  Administered 2016-07-08: 13:00:00
  Filled 2016-07-08: qty 1

## 2016-07-08 MED ORDER — LORAZEPAM 2 MG/ML IJ SOLN
2.0000 mg | Freq: Once | INTRAMUSCULAR | Status: AC
  Administered 2016-07-08: 2 mg via INTRAVENOUS
  Filled 2016-07-08: qty 1

## 2016-07-08 MED ORDER — DIPHENHYDRAMINE HCL 50 MG/ML IJ SOLN
50.0000 mg | Freq: Once | INTRAMUSCULAR | Status: AC
  Administered 2016-07-08: 50 mg via INTRAMUSCULAR
  Filled 2016-07-08: qty 1

## 2016-07-08 MED ORDER — HALOPERIDOL LACTATE 5 MG/ML IJ SOLN
4.0000 mg | Freq: Once | INTRAMUSCULAR | Status: AC
  Administered 2016-07-08: 4 mg via INTRAMUSCULAR
  Filled 2016-07-08: qty 1

## 2016-07-08 NOTE — BH Assessment (Addendum)
16:05-[facility] ([phone]) requesting additional information about the patient (New and Authorized Regional Referral Form, Diversion Form, Updated Vitals, IVC, Updated Notes and MAR). They have a possible bed becoming available.  [facility]    State Referral Form and Diversion Paperwork completed supporting documentation completed and faxed to [employer]. Originals placed on patient's paper chart.   Received the Authorization/Tracking # [id number]) from [facility] (Ebony-[phone]).   Information fax[facility] and confirmed with [facility] staff (Kim-[phone]) it was received.  Hospital Denials: 07/08/2016 @ 16:30-[facility](Phyllis C., Charge Nurse) Too aggressive, IQ must be above 70 07/08/2016 @ 16:45-[facility](Allison, Admissions Department) No beds, Too aggressive, and IQ too low to program. 07/08/2016 @ 17:00-[facility](Tina T. Equities traderAdministrator Coordinator) Too Aggressive and IQ must be above 70 07/08/2016 @ 14:15-[facility] Annell Greening(Jonathan Bass, Admission Department) Patient too aggressive, unable to safely manage her on the unit.   [facility] 07/08/2016 @ 16:55, Contacted [facility] (Crystal [phone]. [phone])  Disposition;Patient information received on 06/01/2016 was being reviewed and pending approval for their wait list in order to start the process of receiving services. At this time, no additional information is need.

## 2016-07-08 NOTE — ED Notes (Signed)
Talked to patient, patient agreed to stay calm if taken out of 4 point restraints.  Dr. Mayford KnifeWilliams informed, agreed to discontinue restraints if patient cooperates.

## 2016-07-08 NOTE — ED Notes (Signed)
Pt has been  aggressive with staff 3 times at this point. The last episode at this time lasted approximately from 12:30 until 13:30. The fell asleep at approx 13:50.

## 2016-07-08 NOTE — ED Notes (Signed)
Soft restraints placed on pt by 2 ED Techs with RN and Security present. Per MD Mayford KnifeWilliams order.
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
## 2016-07-08 NOTE — ED Notes (Signed)
Pt to toilet reports wetting self, pt cleaned self, and changed her own clothes, linens changed
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
## 2016-07-08 NOTE — ED Notes (Signed)
Pt asking for water.  Assisted pt with drink of water.  Keeps sliding down on the stretcher and shaking stretcher. Gave pt a warm blanket to calm her.  Trying to fall asleep
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
## 2016-07-08 NOTE — Progress Notes (Signed)
Called [facility]- No beds patient remains on wait list.  Hannah [name] [name] LCSW [phone]

## 2016-07-08 NOTE — BH Assessment (Signed)
[facility].  Call report to [phone] Representative was Amor Agdeppa, RN.  ER Staff is aware of it Marchelle Folks(Amanda, ER Sect.; Dr. Mayford KnifeWilliams, ER MD & Molli HazardMatthew, Patient's Nurse)    Writer called and left a HIPPA Compliant message with Group Home Maureen Ralphs([name]), requesting a return phone call.  Per [facility] Admission Charge Nurse (Amor Agdeppa), there are no accepting physicians at their facility due to it being a "state facility and we are apart of a team and have multiple doctors that work here." He further states, multiple doctors are a part of the admission process, so there isn't a specific doctor who accepts and they will not know who will be assigned to be the attending physician until the patient arrives. He also states, referring facilities have questioned this and [facility] admissions were advised by their supervisors to inform them, it will not be an Engineering geologistMTALA Violation.   Writer called [facility] ([phone]) and informed them patient will have to transfer to their facility tomorrow (07/09/2016) due to a lack of transportation. [facility] states, due to the patient being accepted and the paperwork requested was received she will not be lose her bed.
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
## 2016-07-08 NOTE — ED Provider Notes (Signed)
[facility]   Emily FilbertJonathan E Williams, MD 07/08/16 2144

## 2016-07-08 NOTE — BH Assessment (Signed)
Updated information about patient's behaviors [facility] and confirmed they were received ([phone]).

## 2016-07-08 NOTE — ED Notes (Signed)
Pt asked to use the bathroom.  Assisted her on a bedpan.  Pt did not go, and began to scoot down on the stretcher with the pan still under her.  Removed it because she states she did not have to go anymore.

## 2016-07-08 NOTE — ED Provider Notes (Addendum)
Patient attempting to leave her room, was attempting to go across the hallway and is now becoming quite agitated. She is resisting, and not redirecting with verbal cues. She is attempted to [employer] Sher[employer]-Williamsgrab security officers gun, and is becoming increasingly agitated despite staff attempts to enter listen to music, coloring with her, and that he can watch television.  He is given small dose of Ativan earlier, with limited effect. At this point for patient and staff safety I ordered additional Ativan as well as Haldol intramuscular for the patient's ongoing and increasingly agitated behavior.   Sharyn CreamerMark Jaskarn Schweer, MD 07/08/16 1121  ----------------------------------------- 12:51 PM on 07/08/2016 -----------------------------------------  Patient again becoming very combative, fighting with him trying to bite staff. She says that she wants to go, and is continuing to fight. She is received repeated doses of Ativan and Haldol, now going to trial Versed intramuscular for patient and staff safety.    Sharyn CreamerMark Dosha Broshears, MD 07/08/16 [phone]

## 2016-07-08 NOTE — ED Provider Notes (Signed)
Patient was increasingly agitated, required additional Ativan, Haldol and Benadryl. Patient is placed in restraints.   Emily FilbertJonathan E Mirca Yale, MD 07/08/16 [phone]

## 2016-07-08 NOTE — BH Assessment (Signed)
[facility] ([phone]) requesting additional information about the patient, due to a possible bed becoming available. New Regional Referral Form, Diversion Form, Updated Vitals, IVC, Updated Notes and MAR.  [facility], pending review.    State Referral Form and Diversion Paperwork completed supporting documentation completed and faxed to [employer].   Received the Authorization/Tracking # [id number]) from [facility] ([phone]).   Information [facility] and confirmed with [facility] staff [phone]) it was received.

## 2016-07-08 NOTE — ED Provider Notes (Signed)
-----------------------------------------   7:16 AM on 07/08/2016 -----------------------------------------   Blood pressure 123/68, pulse 92, temperature 98.2 F (36.8 C), temperature source Oral, resp. rate 18, height 5\' 2"  (1.575 m), weight 160 lb (72.6 kg), SpO2 98 %.  The patient had no acute events since last update.  Calm and cooperative at this time.  Disposition is pending Psychiatry/Behavioral Medicine team recommendations.     Sharman CheekPhillip Cevin Rubinstein, MD 07/08/16 [phone]

## 2016-07-08 NOTE — ED Notes (Signed)
This tech assisted EDT Theodoro Grist(Dave) in placing restraints on pt bed per MD Mayford Knife(Williams) order

## 2016-07-09 NOTE — ED Notes (Signed)
Pt sleeping comfortably.  Given extra blankets.

## 2016-07-09 NOTE — ED Notes (Signed)
Nurse called reports to Baxterconnie, Charity fundraiserN at [facility]

## 2016-07-09 NOTE — ED Notes (Signed)
Pt given water.  Offered bathroom, but pt refused.  Obtained vitals.  Pt resting quietly

## 2016-07-09 NOTE — ED Notes (Signed)
Pt restless.  Offered water and bathroom.  Pt refused.  Fell back to sleep

## 2016-07-09 NOTE — ED Notes (Signed)
Pt asleep.

## 2016-11-01 ENCOUNTER — Encounter: Payer: Self-pay | Admitting: Emergency Medicine

## 2016-11-01 ENCOUNTER — Emergency Department
Admission: EM | Admit: 2016-11-01 | Discharge: 2016-11-14 | Disposition: A | Discharge status: Other Acute Inpatient Hospital | Payer: Medicaid Other | Attending: Emergency Medicine | Admitting: Emergency Medicine

## 2016-11-01 DIAGNOSIS — Z79899 Other long term (current) drug therapy: Secondary | ICD-10-CM | POA: Diagnosis not present

## 2016-11-01 DIAGNOSIS — F71 Moderate intellectual disabilities: Secondary | ICD-10-CM | POA: Diagnosis not present

## 2016-11-01 DIAGNOSIS — F918 Other conduct disorders: Secondary | ICD-10-CM | POA: Diagnosis not present

## 2016-11-01 DIAGNOSIS — Z046 Encounter for general psychiatric examination, requested by authority: Secondary | ICD-10-CM | POA: Diagnosis present

## 2016-11-01 DIAGNOSIS — R4689 Other symptoms and signs involving appearance and behavior: Secondary | ICD-10-CM

## 2016-11-01 DIAGNOSIS — I441 Atrioventricular block, second degree: Secondary | ICD-10-CM | POA: Diagnosis present

## 2016-11-01 DIAGNOSIS — F200 Paranoid schizophrenia: Secondary | ICD-10-CM | POA: Diagnosis present

## 2016-11-01 LAB — CBC WITH DIFFERENTIAL/PLATELET
BASOS ABS: 0 10*3/uL (ref 0–0.1)
Basophils Relative: 0 %
EOS PCT: 3 %
Eosinophils Absolute: 0.3 10*3/uL (ref 0–0.7)
HEMATOCRIT: 36.9 % (ref 35.0–47.0)
HEMOGLOBIN: 12.5 g/dL (ref 12.0–16.0)
LYMPHS PCT: 22 %
Lymphs Abs: 2.9 10*3/uL (ref 1.0–3.6)
MCH: 30.8 pg (ref 26.0–34.0)
MCHC: 33.8 g/dL (ref 32.0–36.0)
MCV: 91.1 fL (ref 80.0–100.0)
Monocytes Absolute: 1.2 10*3/uL — ABNORMAL HIGH (ref 0.2–0.9)
Monocytes Relative: 9 %
NEUTROS ABS: 8.6 10*3/uL — AB (ref 1.4–6.5)
Neutrophils Relative %: 66 %
PLATELETS: 199 10*3/uL (ref 150–440)
RBC: 4.05 MIL/uL (ref 3.80–5.20)
RDW: 13.8 % (ref 11.5–14.5)
WBC: 13.1 10*3/uL — ABNORMAL HIGH (ref 3.6–11.0)

## 2016-11-01 LAB — COMPREHENSIVE METABOLIC PANEL
ALBUMIN: 3.4 g/dL — AB (ref 3.5–5.0)
ALK PHOS: 56 U/L (ref 38–126)
ALT: 14 U/L (ref 14–54)
AST: 25 U/L (ref 15–41)
Anion gap: 5 (ref 5–15)
BILIRUBIN TOTAL: 0.5 mg/dL (ref 0.3–1.2)
BUN: 23 mg/dL — AB (ref 6–20)
CO2: 21 mmol/L — ABNORMAL LOW (ref 22–32)
Calcium: 9.1 mg/dL (ref 8.9–10.3)
Chloride: 113 mmol/L — ABNORMAL HIGH (ref 101–111)
Creatinine, Ser: 1.02 mg/dL — ABNORMAL HIGH (ref 0.44–1.00)
GFR calc Af Amer: >60 mL/min (ref >60)
GFR calc non Af Amer: >60 mL/min (ref >60)
GLUCOSE: 143 mg/dL — AB (ref 65–99)
POTASSIUM: 3.9 mmol/L (ref 3.5–5.1)
Sodium: 139 mmol/L (ref 135–145)
TOTAL PROTEIN: 7.2 g/dL (ref 6.5–8.1)

## 2016-11-01 LAB — ETHANOL: Alcohol, Ethyl (B): <5 mg/dL (ref <5)

## 2016-11-01 MED ORDER — HALOPERIDOL LACTATE 5 MG/ML IJ SOLN
INTRAMUSCULAR | Status: AC
  Administered 2016-11-02: 5 mg via INTRAMUSCULAR
  Filled 2016-11-01: qty 1

## 2016-11-01 MED ORDER — HALOPERIDOL LACTATE 5 MG/ML IJ SOLN
5.0000 mg | Freq: Once | INTRAMUSCULAR | Status: AC
  Administered 2016-11-02: 5 mg via INTRAMUSCULAR

## 2016-11-01 MED ORDER — DIPHENHYDRAMINE HCL 50 MG/ML IJ SOLN
INTRAMUSCULAR | Status: AC
  Administered 2016-11-02: 25 mg via INTRAMUSCULAR
  Filled 2016-11-01: qty 1

## 2016-11-01 MED ORDER — MIDAZOLAM HCL 2 MG/2ML IJ SOLN
2.0000 mg | Freq: Once | INTRAMUSCULAR | Status: AC
  Administered 2016-11-02: 2 mg via INTRAMUSCULAR

## 2016-11-01 MED ORDER — MIDAZOLAM HCL 2 MG/2ML IJ SOLN
INTRAMUSCULAR | Status: AC
  Administered 2016-11-02: 2 mg via INTRAMUSCULAR
  Filled 2016-11-01: qty 2

## 2016-11-01 MED ORDER — DIPHENHYDRAMINE HCL 50 MG/ML IJ SOLN
25.0000 mg | Freq: Once | INTRAMUSCULAR | Status: AC
  Administered 2016-11-02: 25 mg via INTRAMUSCULAR

## 2016-11-01 NOTE — ED Notes (Signed)
Pt handed this nurse a phone number for her grandmother Marylou Flesher [phone]
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
## 2016-11-01 NOTE — ED Notes (Signed)
Spoke with Maureen Ralphs from facility's group home. States that she is sick with the flu and she will have to try to find someone to come get her. Will call back to keep [place] updated.
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
## 2016-11-01 NOTE — ED Notes (Signed)
Pt discharged to home. Caregiver driving.  Discharge instructions reviewed with caregiver.  Verbalized understanding.  No questions or concerns at this time.  Teach back verified.  Pt in NAD.  No items left in ED.
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
## 2016-11-02 DIAGNOSIS — F71 Moderate intellectual disabilities: Secondary | ICD-10-CM

## 2016-11-02 MED ORDER — BENZTROPINE MESYLATE 0.5 MG PO TABS
1.0000 mg | ORAL_TABLET | Freq: Two times a day (BID) | ORAL | Status: DC
  Administered 2016-11-02 – 2016-11-14 (×21): 1 mg via ORAL
  Filled 2016-11-02 (×22): qty 2

## 2016-11-02 MED ORDER — LORAZEPAM 2 MG/ML IJ SOLN
2.0000 mg | Freq: Once | INTRAMUSCULAR | Status: AC
  Administered 2016-11-02: 2 mg via INTRAMUSCULAR

## 2016-11-02 MED ORDER — OLANZAPINE 10 MG PO TABS
20.0000 mg | ORAL_TABLET | Freq: Two times a day (BID) | ORAL | Status: DC
  Administered 2016-11-02 – 2016-11-14 (×20): 20 mg via ORAL
  Filled 2016-11-02 (×23): qty 2

## 2016-11-02 MED ORDER — DIVALPROEX SODIUM 500 MG PO DR TAB
1500.0000 mg | DELAYED_RELEASE_TABLET | Freq: Every day | ORAL | Status: DC
  Administered 2016-11-03 – 2016-11-12 (×8): 1500 mg via ORAL
  Filled 2016-11-02 (×8): qty 3

## 2016-11-02 MED ORDER — DIVALPROEX SODIUM 500 MG PO DR TAB
1000.0000 mg | DELAYED_RELEASE_TABLET | Freq: Every morning | ORAL | Status: DC
  Administered 2016-11-02 – 2016-11-14 (×13): 1000 mg via ORAL
  Filled 2016-11-02 (×15): qty 2

## 2016-11-02 MED ORDER — LORAZEPAM 2 MG/ML IJ SOLN
INTRAMUSCULAR | Status: AC
  Filled 2016-11-02: qty 1

## 2016-11-02 MED ORDER — HALOPERIDOL LACTATE 5 MG/ML IJ SOLN
INTRAMUSCULAR | Status: AC
  Filled 2016-11-02: qty 1

## 2016-11-02 MED ORDER — DIPHENHYDRAMINE HCL 50 MG/ML IJ SOLN
INTRAMUSCULAR | Status: AC
  Filled 2016-11-02: qty 1

## 2016-11-02 MED ORDER — HALOPERIDOL LACTATE 5 MG/ML IJ SOLN
5.0000 mg | Freq: Once | INTRAMUSCULAR | Status: AC
  Administered 2016-11-02: 5 mg via INTRAMUSCULAR
  Filled 2016-11-02: qty 1

## 2016-11-02 MED ORDER — DIPHENHYDRAMINE HCL 50 MG/ML IJ SOLN
50.0000 mg | Freq: Once | INTRAMUSCULAR | Status: AC
  Administered 2016-11-02: 50 mg via INTRAMUSCULAR

## 2016-11-02 MED ORDER — HALOPERIDOL LACTATE 5 MG/ML IJ SOLN
5.0000 mg | Freq: Once | INTRAMUSCULAR | Status: AC
  Administered 2016-11-02: 5 mg via INTRAMUSCULAR

## 2016-11-02 MED ORDER — CLONIDINE HCL 0.1 MG/24HR TD PTWK
0.1000 mg | MEDICATED_PATCH | TRANSDERMAL | Status: DC
  Administered 2016-11-02 – 2016-11-09 (×2): 0.1 mg via TRANSDERMAL
  Filled 2016-11-02 (×2): qty 1

## 2016-11-02 MED ORDER — DOCUSATE SODIUM 100 MG PO CAPS
100.0000 mg | ORAL_CAPSULE | Freq: Two times a day (BID) | ORAL | Status: DC
  Administered 2016-11-02 – 2016-11-14 (×20): 100 mg via ORAL
  Filled 2016-11-02 (×21): qty 1

## 2016-11-02 MED ORDER — PANTOPRAZOLE SODIUM 40 MG PO TBEC
40.0000 mg | DELAYED_RELEASE_TABLET | Freq: Every day | ORAL | Status: DC
  Administered 2016-11-02 – 2016-11-14 (×13): 40 mg via ORAL
  Filled 2016-11-02 (×13): qty 1

## 2016-11-02 MED ORDER — LORAZEPAM 2 MG/ML IJ SOLN
2.0000 mg | Freq: Once | INTRAMUSCULAR | Status: AC
  Administered 2016-11-02: 2 mg via INTRAMUSCULAR
  Filled 2016-11-02: qty 1

## 2016-11-02 MED ORDER — LITHIUM CARBONATE 300 MG PO CAPS
300.0000 mg | ORAL_CAPSULE | Freq: Every day | ORAL | Status: DC
  Administered 2016-11-03 – 2016-11-14 (×9): 300 mg via ORAL
  Filled 2016-11-02 (×14): qty 1

## 2016-11-02 NOTE — Progress Notes (Signed)
Clinical Social WorkeChild psychotherapistceived consult. CSW discussed case with RN. Psych consult is pending. CSW will wait for psych recommendations.   [employer], LCSW [phone]

## 2016-11-02 NOTE — ED Notes (Signed)
BEHAVIORAL HEALTH ROUNDING Patient sleeping: No. Patient alert and oriented: yes Behavior appropriate: Yes.  ; If no, describe:  Nutrition and fluids offered: yes Toileting and hygiene offered: Yes  Sitter present: q15 minute observations and security  monitoring Law enforcement present: Yes  ODS  

## 2016-11-02 NOTE — ED Notes (Addendum)
Received a call from  Bethena Roys  [phone]  He was demanding-"I want to talk to nurse Faysal Fenoglio - Who are you - this went on for several minutes   - not telling me who he was even calling about - I was about to hang up when she stated he was the guardian for this pt  "I doubt you  even know what you are doing - I doubt you are feeding her or keeping her safe.  How do I call your director     - caller informed of how to call ED director  -  - finally he calmed down and stated that he wants her to go to [facility] for hospitalization - I gave him TTS and social work number so that he can make his requests

## 2016-11-02 NOTE — ED Notes (Signed)
PT IVC/SEEN BY DR CLAPACS/PT DOES NOT MEET CRITERIA FOR INPATIENT ADMISSION.

## 2016-11-02 NOTE — ED Notes (Signed)
Patient resting and being calm.  Second restraint removed from left arm at this time.  Will continue to monitor patient for agitation and per restraint policy.  1:1 at bedside.

## 2016-11-02 NOTE — ED Notes (Signed)
Pt sleeping at this time.
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
## 2016-11-02 NOTE — ED Notes (Signed)
One restraint, right arm, removed at this time.  Will continue to monitor patient for agitation and per restraint policy.  1:1 at bedside.

## 2016-11-02 NOTE — ED Notes (Signed)
Patient observed lying in bed with eyes closed  Even, unlabored respirations observed   NAD pt appears to be sleeping  I will continue to monitor along with every 15 minute visual observations and ongoing security monitoring    
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
## 2016-11-02 NOTE — ED Notes (Addendum)
Patient sleeping, has not been agitated.  Restraints removed from bilateral feet at this time.  Restraints order to be discontinued.  Order placed to monitor patient for safety checks every 15 minutes.  Will continue to monitor patient's behavior.  1:1 sitter at bedside.

## 2016-11-02 NOTE — ED Notes (Signed)
Erroneous entry with documentation.
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
## 2016-11-02 NOTE — ED Notes (Signed)
Pt sleeping will wake when her name is called

## 2016-11-02 NOTE — ED Notes (Signed)
Pt asleep at this time, meal tray and juice placed in room. Sitter at bedside.

## 2016-11-02 NOTE — ED Notes (Addendum)
Patient given paper and crayons, will allow as long as patient stays calm.

## 2016-11-02 NOTE — ED Notes (Addendum)
BEHAVIORAL HEALTH ROUNDING Patient sleeping: No. Patient alert and oriented: yes Behavior appropriate:  Aggressive - spitting and biting  meds administered   Restraints to be ordered  .  ; If no, describe:  Nutrition and fluids offered: yes Toileting and hygiene offered: Yes  Sitter present: q15 minute observations and security monitoring Law enforcement present: Yes  ODS

## 2016-11-02 NOTE — ED Notes (Signed)

## 2016-11-03 DIAGNOSIS — F71 Moderate intellectual disabilities: Secondary | ICD-10-CM | POA: Diagnosis not present

## 2016-11-03 LAB — POCT PREGNANCY, URINE: PREG TEST UR: NEGATIVE

## 2016-11-03 MED ORDER — ZIPRASIDONE MESYLATE 20 MG IM SOLR
INTRAMUSCULAR | Status: AC
  Filled 2016-11-03: qty 20

## 2016-11-03 MED ORDER — ZIPRASIDONE MESYLATE 20 MG IM SOLR
20.0000 mg | Freq: Once | INTRAMUSCULAR | Status: AC
  Administered 2016-11-03: 20 mg via INTRAMUSCULAR
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
## 2016-11-03 NOTE — Progress Notes (Signed)
LCSW called  Barb and completed verbal confirmation and is now on priority wait list.  LCSW consulted with ED RN and had a brief visit with Ms Hannah Keller and gently stated to have a good sleep. She thanked me, remained laying down and I  left the room.  [employer] LCSW  [phone]

## 2016-11-03 NOTE — ED Notes (Signed)
Per RN shift report Viviann Spare,) MD advised to hold meds until pt awake, due to aggressive and erratic behavior.
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
## 2016-11-03 NOTE — ED Notes (Signed)
Pt in room punching bed mattress.

## 2016-11-03 NOTE — Progress Notes (Signed)
LCSW called Barb from [employer] and collected data to complete referral to [facility]  LCSW will complete referral for [facility], exceptions forms and receive authorization  LCSW called guardian awaiting for additional information.  [employer] LCSW [phone]
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
## 2016-11-03 NOTE — ED Notes (Signed)
Patient at door swinging at staff

## 2016-11-03 NOTE — ED Notes (Signed)
Patient on phone  

## 2016-11-03 NOTE — Progress Notes (Signed)
Clinical Social Worker (CSW) spoke to [facility] start worker Ramy Jodrey on yesterday 11/02/16 via telephone. Per Ramy [facility] will have to complete a face to face assessment with patient before they can offer a crisis bed. Per Ramy CSW can contact [place] [facility] crisis line at [phone].   [employer], LCSW [phone]

## 2016-11-03 NOTE — ED Notes (Signed)
Patient in bathroom

## 2016-11-03 NOTE — Progress Notes (Signed)
LCSW received call from [facility] Nathanial Millman who will come to ED to meet and assess patient for potential placement at [facility] Nathaniel Man. He will call at 8am tomorrow.  [employer] LCSW [phone]
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
## 2016-11-03 NOTE — ED Notes (Signed)
Pt refusing to go to her bed per officer request. Pt swinging her arms at officer. MD made aware.

## 2016-11-03 NOTE — ED Notes (Signed)
Lunch was placed in patient's room , patient sleeping at this time. 

## 2016-11-03 NOTE — Progress Notes (Signed)
LCSW has faxed over request for tracking and authorization number for [facility], faxed all recent EDP and EDRN nursing notes to [facility] , labs Pand H meds, referral form and exceptions form.  Called and left another message for patients guardian and called [name] to collect additional collateral for these applications.  Called [facility], speaking Nathanial Millman [phone] due to patient acuity may not be able access patient today ( Epic notes reflect current status) but will call later this afternoon to follow up.  [place] Wait list- will be looked in, her case was re-opened.  [employer] LCSW [phone]

## 2016-11-04 MED ORDER — HALOPERIDOL LACTATE 5 MG/ML IJ SOLN
INTRAMUSCULAR | Status: AC
  Administered 2016-11-04: 5 mg via INTRAMUSCULAR
  Filled 2016-11-04: qty 1

## 2016-11-04 MED ORDER — ZIPRASIDONE MESYLATE 20 MG IM SOLR
INTRAMUSCULAR | Status: AC
  Administered 2016-11-04: 20 mg via INTRAMUSCULAR
  Filled 2016-11-04: qty 20

## 2016-11-04 MED ORDER — LORAZEPAM 2 MG/ML IJ SOLN
INTRAMUSCULAR | Status: AC
  Administered 2016-11-04: 2 mg via INTRAMUSCULAR
  Filled 2016-11-04: qty 1

## 2016-11-04 MED ORDER — DIPHENHYDRAMINE HCL 50 MG/ML IJ SOLN
50.0000 mg | Freq: Once | INTRAMUSCULAR | Status: AC
  Administered 2016-11-04: 50 mg via INTRAVENOUS

## 2016-11-04 MED ORDER — LORAZEPAM 2 MG/ML IJ SOLN
2.0000 mg | Freq: Once | INTRAMUSCULAR | Status: AC
  Administered 2016-11-04: 2 mg via INTRAVENOUS

## 2016-11-04 MED ORDER — ZIPRASIDONE MESYLATE 20 MG IM SOLR
20.0000 mg | Freq: Once | INTRAMUSCULAR | Status: AC
  Administered 2016-11-04: 20 mg via INTRAMUSCULAR
  Filled 2016-11-04: qty 20

## 2016-11-04 MED ORDER — LORAZEPAM 2 MG/ML IJ SOLN
INTRAMUSCULAR | Status: AC
  Administered 2016-11-04: 2 mg via INTRAVENOUS
  Filled 2016-11-04: qty 1

## 2016-11-04 MED ORDER — DIPHENHYDRAMINE HCL 50 MG/ML IJ SOLN
INTRAMUSCULAR | Status: AC
  Administered 2016-11-04: 50 mg via INTRAVENOUS
  Filled 2016-11-04: qty 1

## 2016-11-04 MED ORDER — HALOPERIDOL LACTATE 5 MG/ML IJ SOLN
5.0000 mg | Freq: Once | INTRAMUSCULAR | Status: AC
  Administered 2016-11-04: 5 mg via INTRAMUSCULAR

## 2016-11-04 MED ORDER — LORAZEPAM 2 MG/ML IJ SOLN
2.0000 mg | Freq: Once | INTRAMUSCULAR | Status: AC
  Administered 2016-11-04: 2 mg via INTRAMUSCULAR

## 2016-11-04 MED ORDER — ZIPRASIDONE MESYLATE 20 MG IM SOLR
20.0000 mg | Freq: Once | INTRAMUSCULAR | Status: AC
  Administered 2016-11-04: 20 mg via INTRAMUSCULAR

## 2016-11-04 MED ORDER — LORAZEPAM 2 MG/ML IJ SOLN: 2.0000 mg | Freq: Once | INTRAMUSCULAR | Status: DC

## 2016-11-04 NOTE — ED Notes (Addendum)
After Geodon injection pt still attempting to hit, kick, scratch and bite staff despite attempts to verbally redirect patient. RN apply soft mittens, however, pt was able to get them off and throw them across the room. Dr. Lenard Lance informed of his and verbal order given for Ativan 2 mg and Benadryl 50 mg IM.  Pt given Ativan 2 mg IM and Benadryl 50 mg IM at 0953.

## 2016-11-04 NOTE — Progress Notes (Signed)
Received authorization [id number]  Called Patients guardian and left another message awaiting call back.   [place] requesting additional information ( Patient IDD Diagnosis) Prior Hospitalizations, if she needs 1-1, once LCSW has obtained this information will re-fax to [facility] Access
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
## 2016-11-04 NOTE — Progress Notes (Signed)
Nathanial Millman from [facility] start coming to assess patient at 1pm today.   [employer] LCSW [phone]

## 2016-11-04 NOTE — ED Notes (Signed)
Pt in room, currently calm and cooperative.

## 2016-11-04 NOTE — ED Provider Notes (Signed)
-----------------------------------------   7:18 AM on 11/04/2016 -----------------------------------------   Blood pressure (!) 104/57, pulse 81, temperature 98 F (36.7 C), resp. rate 16, height  (1.473 m), weight 160 lb (72.6 kg), last menstrual period 10/08/2016, SpO2 100 %.  The patient had no acute events since last update.  Patient has not had any further episodes of agitation or violent since yesterday morning. [facility], awaiting to hear back.     Minna Antis, MD 11/04/16 [phone]

## 2016-11-04 NOTE — ED Notes (Signed)
After release patient noted to be sitting in bed resting calm and cooperatively eating crackers and watching TV at this time. Pt is noted to be in NAD at this time. Skin warm, dry, and intact at this time. Respirations even and unlabored. Pt maintains active and passive ROM to all extremities, neurologically patient remains at baseline. Will continue to monitor for further patient needs.

## 2016-11-04 NOTE — ED Notes (Signed)
BEHAVIORAL HEALTH ROUNDING  Patient sleeping: No.  Patient alert and oriented: yes  Behavior appropriate: Yes. ; If no, describe:  Nutrition and fluids offered: Yes  Toileting and hygiene offered: Yes  Sitter present: not applicable, Q 15 min safety rounds and observation.  Law enforcement present: Yes ODS  
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
## 2016-11-04 NOTE — ED Notes (Signed)
Pt given dinner tray.

## 2016-11-04 NOTE — Progress Notes (Signed)
Received call from [facility] completed and signed off on the diversion form and has faxed it to [facility].  LCSW supporting [place] [facility]  Nathanial Millman to assess Saia if possible.   Monterio Bob LCSW

## 2016-11-04 NOTE — ED Notes (Signed)
ENVIRONMENTAL ASSESSMENT  Potentially harmful objects out of patient reach: Yes.  Personal belongings secured: Yes.  Patient dressed in hospital provided attire only: Yes.  Plastic bags out of patient reach: Yes.  Patient care equipment (cords, cables, call bells, lines, and drains) shortened, removed, or accounted for: Yes.  Equipment and supplies removed from bottom of stretcher: Yes.  Potentially toxic materials out of patient reach: Yes.  Sharps container removed or out of patient reach: Yes.   BEHAVIORAL HEALTH ROUNDING  Patient sleeping: No.  Patient alert and oriented: yes  Behavior appropriate: Yes. ; If no, describe:  Nutrition and fluids offered: Yes  Toileting and hygiene offered: Yes  Sitter present: not applicable, Q 15 min safety rounds and observation.  Law enforcement present: Yes ODS  ED BHU PLACEMENT JUSTIFICATION  Is the patient under IVC or is there intent for IVC: Yes.  Is the patient medically cleared: Yes.  Is there vacancy in the ED BHU: Yes.  Is the population mix appropriate for patient: No pt does not meet BHU criteria.  Is the patient awaiting placement in inpatient or outpatient setting: Yes.  Has the patient had a psychiatric consult: Yes.  Survey of unit performed for contraband, proper placement and condition of furniture, tampering with fixtures in bathroom, shower, and each patient room: Yes. ; Findings: All clear  APPEARANCE/BEHAVIOR  calm, cooperative and adequate rapport can be established  NEURO ASSESSMENT  Orientation: time, place and person  Hallucinations: No.None noted (Hallucinations)  Speech: Normal at her baseline Gait: normal  RESPIRATORY ASSESSMENT  WNL  CARDIOVASCULAR ASSESSMENT  WNL  GASTROINTESTINAL ASSESSMENT  WNL  EXTREMITIES  WNL  PLAN OF CARE  Provide calm/safe environment. Vital signs assessed TID. ED BHU Assessment once each 12-hour shift. Collaborate with TTS daily or as condition indicates. Assure the ED provider has  rounded once each shift. Provide and encourage hygiene. Provide redirection as needed. Assess for escalating behavior; address immediately and inform ED provider.  Assess family dynamic and appropriateness for visitation as needed: Yes. ; If necessary, describe findings:  Educate the patient/family about BHU procedures/visitation: Yes. ; If necessary, describe findings: Pt is calm and cooperative at this time. Pt understanding and accepting of unit procedures/rules. Will continue to monitor with Q 15 min safety rounds and observation.

## 2016-11-04 NOTE — ED Notes (Signed)
Dr. Lenard Lance at bedside at this time. Pt is crying and screaming "let me go!" and "I don't want to be here".

## 2016-11-04 NOTE — ED Notes (Signed)
MD to bedside, when patient released from restraints, pt then became agitated, refused to go back in room. Pt then began yelling at staff, attempting to hit and scratch staff. MD, this RN, ODS security, Onalee Hua, EDT attempted to redirect patient back to room without success.

## 2016-11-04 NOTE — ED Notes (Signed)
This RN remains 1:1 sitter with patient while in restraints. Pt is noted to be pulling against restraints at this time. This RN reinforced with patient the criteria for release. Pt noted to be pulling against her restraints attempting to get out of them. Will continue to monitor for patient to meet criteria for release.

## 2016-11-04 NOTE — ED Notes (Signed)
Pt requesting to talk to MD, Dr. Lenard Lance aware.

## 2016-11-04 NOTE — ED Notes (Signed)
Pt noted to be out of room multiple times, this RN, ODS, Noreene Larsson, RN, Theodoro Grist, EDT attempted to redirect without success. Pt started to hit staff, attempting to scratch. MD notified, this RN received verbal order for  IM Haldol,  IM Ativan. Administered by this RN and Noreene Larsson, RN. Pt noted to continue to be attempting to scratch and bite at this time.

## 2016-11-04 NOTE — ED Notes (Signed)
Report given to Ann, RN.

## 2016-11-04 NOTE — ED Notes (Signed)
Pt given meal tray.

## 2016-11-04 NOTE — Progress Notes (Signed)
LCSW received  A call from [employer] [facility] Coordinator [place] who reported pt has been approved for Diversion and it will be faxed to [facility]  LCSW called [facility] spoke to Chales Salmon who has reviewed and now forwarded it to her director Cindee Salt DON [facility]. Currently there are no beds. Chales Salmon will call ASAP if female bed comes available.  Assisted Nathanial Millman with demographics of patient who is from [facility] and he has recommended inpatient  [facility]- Crisis bed and will fax his reports over to [facility] in the next hour and provide LCSW a copy.  Called ED Secretary and reported NO beds (female) she will advise Megan EDRN.- LCSW on Standby to ensure if [facility] bed available staff will be informed.  LCSW attempted to re-direct patient with  Little or no success. Once she would sit and talk with me and 20 seconds later attempting to race out door yelling, biting ,scratching herself and kicking at trays and staff. Several attempts to redirect and guide patient back in her room. NMedical staff consulted.  LCSW will keep them posted.  [employer] LCSW [phone]

## 2016-11-04 NOTE — ED Notes (Signed)
MD at bedside at this time. Pt noted to be calming down at this time. Per MD release from restraints at this time. Pt released.

## 2016-11-04 NOTE — ED Provider Notes (Signed)
-----------------------------------------   1:47 PM on 11/04/2016 -----------------------------------------  The patient has once again become agitated and hitting staff members. Unfortunately the initial sedation did not appear to last very long. Patient will receive an additional 2 mg of Ativan and 5 mg of Haldol intramuscularly. Patient continues to be extremely combative attempting to bite and hit staff members. They're having to actively restrain her to keep her from biting and hitting. Every time we attempted to de-escalate she briefly comes down but as soon as she is relief she again attempts to bite and hit wall yelling at staff members. We will place the patient in soft restraints for her safety as well as the staff members.   Minna Antis, MD 11/04/16 1348

## 2016-11-04 NOTE — ED Notes (Signed)
Pt visualized in NAD. Pt in room with TV on and paper and crayons. Denies any needs at this time. Will continue to monitor for further patient needs.
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
## 2016-11-04 NOTE — ED Notes (Signed)
This RN remains 1:1 sitter with patient at this time. Pt is noted to be calm and cooperative given water, requesting crackers at this time. MD notified patient is calm and cooperative, states he will come to bedside to assess for restraint removal.

## 2016-11-04 NOTE — ED Notes (Signed)
Pt remains calm and cooperative. NAD noted at this time. Respirations even and unlabored, skin warm, dry, and intact. Pt remains at baseline neurologically. Pt is noted to be coloring and watching TV. PT requesting to know when dinner is.

## 2016-11-04 NOTE — ED Notes (Signed)
Pt ivc/ pending placement  

## 2016-11-05 MED ORDER — HALOPERIDOL LACTATE 5 MG/ML IJ SOLN
INTRAMUSCULAR | Status: AC
  Administered 2016-11-05: 10 mg via INTRAMUSCULAR
  Filled 2016-11-05: qty 2

## 2016-11-05 MED ORDER — DIPHENHYDRAMINE HCL 50 MG/ML IJ SOLN
50.0000 mg | Freq: Once | INTRAMUSCULAR | Status: AC
  Administered 2016-11-05: 50 mg via INTRAMUSCULAR
  Filled 2016-11-05: qty 1

## 2016-11-05 MED ORDER — LORAZEPAM 2 MG/ML IJ SOLN
2.0000 mg | Freq: Once | INTRAMUSCULAR | Status: DC
  Filled 2016-11-05: qty 1

## 2016-11-05 MED ORDER — HALOPERIDOL LACTATE 5 MG/ML IJ SOLN
10.0000 mg | Freq: Once | INTRAMUSCULAR | Status: AC
  Administered 2016-11-05: 10 mg via INTRAMUSCULAR

## 2016-11-05 NOTE — ED Notes (Addendum)
Pt assisted to shower. Pt given towels, wash clothes, a clean set of behavioral health clothing to change in to, soap, deodorant, toothbrush, toothpaste and lotion to use which will all be disposed of once pt is finished in the shower. Bed has been cleaned and cleaned linen and a warm blanket placed on bed. Floor mopped by EVS.

## 2016-11-05 NOTE — ED Notes (Signed)
Pt given lunch tray, resting In bed eating

## 2016-11-05 NOTE — ED Notes (Signed)
Pt resting in bed, resp even and unlabored 

## 2016-11-05 NOTE — ED Notes (Signed)
BEHAVIORAL HEALTH ROUNDING Patient sleeping: Yes.   Patient alert and oriented: not applicable SLEEPING Behavior appropriate: Yes.  ; If no, describe: SLEEPING Nutrition and fluids offered: No SLEEPING Toileting and hygiene offered: NoSLEEPING Sitter present: not applicable, Q 15 min safety rounds and observation. Law enforcement present: Yes ODS 

## 2016-11-05 NOTE — ED Notes (Signed)
Pt began to be very aggressive, yelling and trying to walk down hallway, swinging and kicking at staff,  pt put on stretcher, orders received form IM haldol

## 2016-11-05 NOTE — ED Notes (Signed)
Pt given meal tray and a cup of sprite. Pt sitting up in bed eating and watching tv.

## 2016-11-05 NOTE — ED Notes (Signed)
IVC 

## 2016-11-05 NOTE — ED Provider Notes (Signed)
-----------------------------------------   8:30 AM on 11/05/2016 -----------------------------------------   BP 125/75 (BP Location: Right Arm)   Pulse 80   Temp 97.8 F (36.6 C) (Oral)   Resp 18   Ht  (1.473 m)   Wt 72.6 kg   LMP 10/08/2016 (Approximate)   SpO2 100%   BMI 33.44 kg/m   No acute events overnight. Vitals reviewed. Patient did not require further sedation overnight. Disposition pending behavioral medicine team recommendations    Jene Every, MD 11/05/16 0830

## 2016-11-05 NOTE — ED Notes (Signed)
Pt done with shower at this time, to room to brush teeth and eat breakfast.

## 2016-11-05 NOTE — ED Notes (Signed)
Pt given meal tray, juice and a sprite. Pt sitting on bed eating and watching tv.

## 2016-11-05 NOTE — ED Notes (Signed)
Pt sitting on floor in room coloring, awake and alert, denies any needs

## 2016-11-05 NOTE — Progress Notes (Signed)
LCSW called Ewing Schlein at [facility]- No beds today and is hopeful there will be a female bed tomorrow. Patient is at top of list. Faxed over [place] start Assessment and recommendations.  Awaiting a bed offer.  Lataya Varnell LCSW
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
## 2016-11-05 NOTE — ED Notes (Signed)
Pt given crayons and paper, resting in bed coloring

## 2016-11-05 NOTE — ED Notes (Signed)
Pt resting in bed, eating breakfast, TV turned on for pt

## 2016-11-05 NOTE — ED Notes (Signed)
Pt awake, put in bathroom to take shower

## 2016-11-05 NOTE — ED Notes (Signed)
Breakfast tray placed in rm, pt asleep at this time.

## 2016-11-05 NOTE — ED Notes (Signed)
Pt asking for sprite and crackers, given to pt, pt asking to use phone, pt calm and cooperative at this time

## 2016-11-05 NOTE — ED Notes (Signed)
Pt resting in bed, lights dimmed, pt in no distress

## 2016-11-05 NOTE — ED Notes (Signed)
Pt given cup of water 

## 2016-11-05 NOTE — ED Notes (Signed)
Pt awake, coloring in room and watching tv, pt calm at this time

## 2016-11-05 NOTE — ED Notes (Signed)
Pt resting in bed, eyes closed resp even and unlabored

## 2016-11-05 NOTE — ED Notes (Signed)
Pt resting in bed, resp even and unlabored, eyes closed, lights dimmed in room

## 2016-11-05 NOTE — ED Notes (Signed)
BEHAVIORAL HEALTH ROUNDING  Patient sleeping: No.  Patient alert and oriented: yes  Behavior appropriate: Yes. ; If no, describe:  Nutrition and fluids offered: Yes  Toileting and hygiene offered: Yes  Sitter present: not applicable, Q 15 min safety rounds and observation.  Law enforcement present: Yes ODS  

## 2016-11-05 NOTE — ED Notes (Addendum)
Pt given a cup of sprite, graham crackers and peanut butter.

## 2016-11-06 NOTE — ED Provider Notes (Signed)
-----------------------------------------   6:57 AM on 11/06/2016 -----------------------------------------   Blood pressure 124/71, pulse (!) 102, temperature 98.7 F (37.1 C), temperature source Oral, resp. rate 19, height  (1.473 m), weight 160 lb (72.6 kg), last menstrual period 10/08/2016, SpO2 98 %.  The patient had no acute events since last update.  Calm and cooperative at this time.  Disposition is pending Psychiatry/Behavioral Medicine team recommendations.     Rebecka Apley, MD 11/06/16 [phone]

## 2016-11-06 NOTE — ED Notes (Addendum)
meds administered as ordered  - assessment completed   Snack and drink provided    BEHAVIORAL HEALTH ROUNDING Patient sleeping: No. Patient alert and oriented: yes Behavior appropriate: Yes.  ; If no, describe:  Nutrition and fluids offered: yes Toileting and hygiene offered: Yes  Sitter present: q15 minute observations and security monitoring Law enforcement present: Yes  ODS

## 2016-11-06 NOTE — ED Notes (Signed)
ED BHU PLACEMENT JUSTIFICATION Is the patient under IVC or is there intent for IVC: yes Is the patient medically cleared: Yes.   Is there vacancy in the ED BHU: Yes.   Is the population mix appropriate for patient: Yes.   Is the patient awaiting placement in inpatient or outpatient setting: Yes.   Has the patient had a psychiatric consult: Yes.   Survey of unit performed for contraband, proper placement and condition of furniture, tampering with fixtures in bathroom, shower, and each patient room: Yes.  ; Findings:  APPEARANCE/BEHAVIOR Calm and cooperative NEURO ASSESSMENT Orientation: oriented to place and self   Denies pain Hallucinations: No.None noted (Hallucinations) Speech: Normal Gait: normal RESPIRATORY ASSESSMENT Even  Unlabored respirations  CARDIOVASCULAR ASSESSMENT Pulses equal   regular rate  Skin warm and dry   GASTROINTESTINAL ASSESSMENT no GI complaint EXTREMITIES Full ROM  PLAN OF CARE Provide calm/safe environment. Vital signs assessed twice daily. ED BHU Assessment once each 12-hour shift. Collaborate with TTS daily or as condition indicates. Assure the ED provider has rounded once each shift. Provide and encourage hygiene. Provide redirection as needed. Assess for escalating behavior; address immediately and inform ED provider.  Assess family dynamic and appropriateness for visitation as needed: Yes.  ; If necessary, describe findings:  Educate the patient/family about BHU procedures/visitation: Yes.  ; If necessary, describe findings:

## 2016-11-06 NOTE — ED Notes (Signed)
PT IVC/ PENDING PLACEMENT  

## 2016-11-06 NOTE — ED Notes (Signed)

## 2016-11-06 NOTE — ED Notes (Signed)
Pt given supper tray. Pt given sprite.

## 2016-11-06 NOTE — ED Notes (Signed)
BEHAVIORAL HEALTH ROUNDING Patient sleeping: No. Patient alert and oriented: yes Behavior appropriate: Yes.  ; If no, describe:  Nutrition and fluids offered: yes Toileting and hygiene offered: Yes  Sitter present: q15 minute observations and security  monitoring Law enforcement present: Yes  ODS  

## 2016-11-06 NOTE — ED Notes (Signed)
She is lying in bed  - covered by her blankets - eyes closed  She appears to be resting  NAD assessed  Continue to monitor

## 2016-11-06 NOTE — ED Notes (Signed)
Pt taking a shower. Linens changed, trash removed from room.

## 2016-11-06 NOTE — ED Notes (Signed)
Pt sleeping. Vitals will be taken when pt wakes up.

## 2016-11-06 NOTE — ED Notes (Signed)
Patient took night time meds without incident. Patient denies needs at this time. Patient calm and cooperative.

## 2016-11-06 NOTE — ED Notes (Signed)
Pt given breakfast tray

## 2016-11-06 NOTE — ED Notes (Signed)
Pt observed with no unusual behavior  TV is on with music playing    Appropriate to stimulation  No verbalized needs or concerns at this time  NAD assessed  Continue to monitor

## 2016-11-06 NOTE — ED Notes (Signed)
Pt given lunch tray.

## 2016-11-06 NOTE — Clinical Social Work Note (Addendum)
CSW contacted [facility] [phone] patient is still on wait list, CSW was informed the physician is supposed to be reviewing patient's record currently.  [facility] said they will call CSW back if bed is available, awaiting call back from [facility].  11:43am  CSW contacted [facility] again to get an updated on bed availability spoke to [facility] in admissions, and the packet is still with medical director being reviewed.  CSW awaiting call back from [facility].  2:00pm  CSW received phone call from Colan Neptune patient's Care Coordinator at [facility] [phone] asking for an update on placement for patient.  CSW informed her that [facility] is still reviewing patient's chart and a bed is still not available.  Patient's care coordinator was asking for further update on how patient has been doing, CSW advised her to contact ED and ask to speak with her nurse.  2:45pm  CSW received phone call from [facility], patient is still on the wait list.  CSW continuing to follow patient's progress throughout discharge planning.  Ervin Knack. Coriana Angello, MSW, Theresia Majors [phone]  11/06/2016 9:50 AM

## 2016-11-07 DIAGNOSIS — F71 Moderate intellectual disabilities: Secondary | ICD-10-CM | POA: Diagnosis not present

## 2016-11-07 MED ORDER — HALOPERIDOL LACTATE 5 MG/ML IJ SOLN
INTRAMUSCULAR | Status: AC
  Filled 2016-11-07: qty 1

## 2016-11-07 MED ORDER — HALOPERIDOL LACTATE 5 MG/ML IJ SOLN
5.0000 mg | Freq: Once | INTRAMUSCULAR | Status: AC
  Administered 2016-11-07: 5 mg via INTRAMUSCULAR

## 2016-11-07 MED ORDER — DIPHENHYDRAMINE HCL 50 MG/ML IJ SOLN
50.0000 mg | Freq: Once | INTRAMUSCULAR | Status: AC
  Administered 2016-11-07: 50 mg via INTRAMUSCULAR

## 2016-11-07 MED ORDER — DIPHENHYDRAMINE HCL 50 MG/ML IJ SOLN
INTRAMUSCULAR | Status: AC
  Filled 2016-11-07: qty 1

## 2016-11-07 NOTE — ED Notes (Signed)
Lunch was given to patient 
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
## 2016-11-07 NOTE — ED Notes (Signed)
Breakfast  was given to patient and a sprite was given

## 2016-11-07 NOTE — ED Notes (Signed)

## 2016-11-07 NOTE — ED Notes (Signed)
Patient in bathroom

## 2016-11-07 NOTE — ED Notes (Signed)
ED BHU PLACEMENT JUSTIFICATION Is the patient under IVC or is there intent for IVC: Yes.   Is the patient medically cleared: Yes.   Is there vacancy in the ED BHU: Yes.   Is the population mix appropriate for patient: Yes.   Is the patient awaiting placement in inpatient or outpatient setting: Yes.   Has the patient had a psychiatric consult: Yes.   Survey of unit performed for contraband, proper placement and condition of furniture, tampering with fixtures in bathroom, shower, and each patient room: Yes.  ; Findings:  APPEARANCE/BEHAVIOR Calm and cooperative NEURO ASSESSMENT Orientation: oriented to self and place   Denies pain Hallucinations: No.None noted (Hallucinations) Speech: Normal Gait: normal RESPIRATORY ASSESSMENT Even  Unlabored respirations  CARDIOVASCULAR ASSESSMENT Pulses equal   regular rate  Skin warm and dry   GASTROINTESTINAL ASSESSMENT no GI complaint EXTREMITIES Full ROM  PLAN OF CARE Provide calm/safe environment. Vital signs assessed twice daily. ED BHU Assessment once each 12-hour shift. Collaborate with daily or as condition indicates. Assure the ED provider has rounded once each shift. Provide and encourage hygiene. Provide redirection as needed. Assess for escalating behavior; address immediately and inform ED provider.  Assess family dynamic and appropriateness for visitation as needed: Yes.  ; If necessary, describe findings:  Educate the patient/family about BHU procedures/visitation: Yes.  ; If necessary, describe findings:

## 2016-11-07 NOTE — ED Notes (Signed)
BEHAVIORAL HEALTH ROUNDING Patient sleeping: No. Patient alert and oriented:  Alert  Oriented to self and place  Behavior appropriate: Yes.  ; If no, describe:  Nutrition and fluids offered: yes Toileting and hygiene offered: Yes  Sitter present: q15 minute observations and security  monitoring Law enforcement present: Yes  ODS

## 2016-11-07 NOTE — BH Assessment (Signed)
Writer confirmed with [facility] ([phone]) she remains on their Wait List.

## 2016-11-07 NOTE — ED Notes (Addendum)
Assessment completed  meds administered as ordered    3 packs of crackers with PB, a sprite and applesauce provided  She takes her meds whole in applesauce

## 2016-11-07 NOTE — ED Notes (Signed)
She is calmer - medication appears to be helping  TV turned on to her preferred channel
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
## 2016-11-07 NOTE — ED Notes (Signed)
She is standing in the doorway of her room  - waving at me during report   NAD observed

## 2016-11-07 NOTE — ED Notes (Signed)
BEHAVIORAL HEALTH ROUNDING Patient sleeping: No. Patient alert and oriented: yes Behavior appropriate: Yes.  ; If no, describe:  Nutrition and fluids offered: yes Toileting and hygiene offered: Yes  Sitter present: q15 minute observations and security  monitoring Law enforcement present: Yes  ODS  

## 2016-11-07 NOTE — ED Notes (Signed)
BEHAVIORAL HEALTH ROUNDING Patient sleeping: No. Patient alert and oriented:  Alert - oriented to self, place  Behavior appropriate: Yes.  ; If no, describe:  Nutrition and fluids offered: yes Toileting and hygiene offered: Yes  Sitter present: q15 minute observations and security monitoring Law enforcement present: Yes  ODS

## 2016-11-07 NOTE — ED Notes (Signed)
BEHAVIORAL HEALTH ROUNDING Patient sleeping: No. Patient alert and oriented: alert - oriented to self and place  Behavior appropriate: Yes.  ; If no, describe:  Nutrition and fluids offered: yes Toileting and hygiene offered: Yes  Sitter present: q15 minute observations and security  monitoring Law enforcement present: Yes  ODS

## 2016-11-07 NOTE — ED Notes (Signed)
BEHAVIORAL HEALTH ROUNDING Patient sleeping: No. Patient alert and oriented:  Alert  Oriented to self place  Behavior appropriate: Yes.  ; If no, describe:  Nutrition and fluids offered: yes Toileting and hygiene offered: Yes  Sitter present: q15 minute observations and security monitoring Law enforcement present: Yes  ODS

## 2016-11-07 NOTE — ED Notes (Signed)
She is in the hallway - walking into others rooms  Screaming  - don't touch me -  I am leaving - go get my clothes - I am ready to go  - pt IVC  MD Don Perking to hallway to assess pt   meds to be ordered     Pt still able to be rerouted verbally at present

## 2016-11-08 MED ORDER — LORAZEPAM 2 MG/ML IJ SOLN
2.0000 mg | INTRAMUSCULAR | Status: DC | PRN
  Administered 2016-11-10 – 2016-11-11 (×4): 2 mg via INTRAMUSCULAR
  Filled 2016-11-08 (×2): qty 1

## 2016-11-08 MED ORDER — HALOPERIDOL LACTATE 5 MG/ML IJ SOLN
10.0000 mg | Freq: Once | INTRAMUSCULAR | Status: AC
  Administered 2016-11-10: 10 mg via INTRAMUSCULAR
  Filled 2016-11-08 (×2): qty 2

## 2016-11-08 MED ORDER — HALOPERIDOL LACTATE 5 MG/ML IJ SOLN
20.0000 mg | Freq: Once | INTRAMUSCULAR | Status: AC
  Administered 2016-11-08: 20 mg via INTRAMUSCULAR
  Filled 2016-11-08: qty 4

## 2016-11-08 MED ORDER — HALOPERIDOL LACTATE 5 MG/ML IJ SOLN
INTRAMUSCULAR | Status: AC
  Administered 2016-11-08: 20 mg via INTRAMUSCULAR
  Filled 2016-11-08: qty 2

## 2016-11-08 MED ORDER — LORAZEPAM 2 MG/ML IJ SOLN
2.0000 mg | Freq: Once | INTRAMUSCULAR | Status: AC
  Administered 2016-11-08: 2 mg via INTRAMUSCULAR

## 2016-11-08 MED ORDER — ZIPRASIDONE HCL 40 MG PO CAPS
40.0000 mg | ORAL_CAPSULE | Freq: Two times a day (BID) | ORAL | Status: DC
  Filled 2016-11-08: qty 1
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
## 2016-11-08 NOTE — ED Notes (Signed)
Pt came into hallway and was talking to ED tech. After having a calm conversation with the tech, the tech asked the pt to return to her room the pt refused and attempted to go into the room across the hall (RM 20). The tech stopped her and the pt started yelling and fighting with the ED Tech as well as another tech who came to assist. Security assisted with containing the pt in her room and once the pt was placed back in her bed they assisted with holding the pt down in order to keep her from injuring others or herself. The pt was attempting to kick, hit, bite and spit at staff. The EDP was notified and medication was ordered as well as restraints. The pt was placed in soft 4pt restraints per MD order.
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
## 2016-11-08 NOTE — BH Assessment (Signed)
Updated information about patient behaviors in the ER faxed and confirmed it was received.  Patient remains on their priority wait list.

## 2016-11-08 NOTE — ED Notes (Signed)
Pt in shower. Linens changed
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
## 2016-11-08 NOTE — ED Notes (Signed)
Pt given breakfast tray with sprite

## 2016-11-08 NOTE — ED Notes (Signed)
Pt sleeping. Supper tray sat at sink

## 2016-11-08 NOTE — ED Notes (Signed)
Restraints removed to allow pt to use the bathroom. Cap refill <3 sec. Pt denies numbness/tingling in hands, arms, feet and legs.

## 2016-11-08 NOTE — BH Assessment (Signed)
Late Entry-Writer spoke with patient's [facility] Care Coordinator Theador Hawthorne [phone]) and updated her on the patient behaviors in the ER. Also informed her patient remains on [facility] Priority Wait List. Care Coordinator was under the impression she had already transferred to [facility].

## 2016-11-08 NOTE — ED Notes (Signed)
Pt becoming anxious and agitated. Pt attempting to leave room. Pt asked to return to room by staff and security. Additional security called and he was able to calm the pt. Pt stated she hungry and this RN ordered a second tray for the pt.

## 2016-11-08 NOTE — ED Provider Notes (Signed)
-----------------------------------------   6:44 AM on 11/08/2016 -----------------------------------------   Blood pressure (!) 123/97, pulse 90, temperature 98.8 F (37.1 C), temperature source Oral, resp. rate 20, height  (1.473 m), weight 160 lb (72.6 kg), last menstrual period 10/08/2016, SpO2 100 %.  The patient had no acute events since last update.  Calm and cooperative at this time.  Disposition is pending Psychiatry/Behavioral Medicine team recommendations.     Irean Hong, MD 11/08/16 [phone]

## 2016-11-09 MED ORDER — HALOPERIDOL LACTATE 5 MG/ML IJ SOLN
5.0000 mg | Freq: Once | INTRAMUSCULAR | Status: AC
  Administered 2016-11-09: 5 mg via INTRAMUSCULAR

## 2016-11-09 MED ORDER — DIPHENHYDRAMINE HCL 50 MG/ML IJ SOLN
25.0000 mg | Freq: Once | INTRAMUSCULAR | Status: AC
  Administered 2016-11-09: 25 mg via INTRAVENOUS

## 2016-11-09 MED ORDER — DIPHENHYDRAMINE HCL 50 MG/ML IJ SOLN
INTRAMUSCULAR | Status: AC
  Filled 2016-11-09: qty 1
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
## 2016-11-09 NOTE — ED Notes (Signed)
She has been to the [facility] this am  - dancing and snapping her fingers - appears happy   NAD assessed   BEHAVIORAL HEALTH ROUNDING Patient sleeping: No. Patient alert and oriented: yes Behavior appropriate: Yes.  ; If no, describe:  Nutrition and fluids offered: yes Toileting and hygiene offered: Yes  Sitter present: q15 minute observations and security monitoring Law enforcement present: Yes  ODS  ENVIRONMENTAL ASSESSMENT Potentially harmful objects out of patient reach: Yes.   Personal belongings secured: Yes.   Patient dressed in hospital provided attire only: Yes.   Plastic bags out of patient reach: Yes.   Patient care equipment (cords, cables, call bells, lines, and drains) shortened, removed, or accounted for: Yes.   Equipment and supplies removed from bottom of stretcher: Yes.   Potentially toxic materials out of patient reach: Yes.   Sharps container removed or out of patient reach: Yes.

## 2016-11-09 NOTE — ED Notes (Signed)
Patient in shower 

## 2016-11-09 NOTE — ED Notes (Signed)
BEHAVIORAL HEALTH ROUNDING Patient sleeping: No. Patient alert and oriented: yes Behavior appropriate: Yes.  ; If no, describe:  Nutrition and fluids offered: yes Toileting and hygiene offered: Yes  Sitter present: q15 minute observations and security  monitoring Law enforcement present: Yes  ODS  

## 2016-11-09 NOTE — ED Notes (Signed)
Lunch was given to patient 
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
## 2016-11-09 NOTE — ED Notes (Signed)
BEHAVIORAL HEALTH ROUNDING Patient sleeping: Yes.   Patient alert and oriented: eyes closed  Appears to be asleep Behavior appropriate: Yes.  ; If no, describe:  Nutrition and fluids offered: Yes  Toileting and hygiene offered: sleeping Sitter present: q 15 minute observations and security monitoring Law enforcement present: yes  ODS 

## 2016-11-09 NOTE — ED Notes (Signed)
Patient went shower ,refused to wash but patient changed clothes
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
## 2016-11-09 NOTE — ED Provider Notes (Signed)
-----------------------------------------   7:10 AM on 11/09/2016 -----------------------------------------   Blood pressure 130/68, pulse (!) 108, temperature 98.8 F (37.1 C), temperature source Oral, resp. rate 18, height  (1.473 m), weight 72.6 kg, last menstrual period 10/08/2016, SpO2 98 %.  The patient had no acute events since last update.  Calm and cooperative at this time.  Placement pending.     Loleta Rose, MD 11/09/16 [phone]

## 2016-11-09 NOTE — ED Notes (Signed)
Pt with an allergy to lorazepam two days ago and now lorazepam no longer on her allergy list ?
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
## 2016-11-09 NOTE — ED Notes (Signed)
Pt observed with no unusual behavior - lying in bed    Appropriate to stimulation  No verbalized needs or concerns at this time  NAD assessed  Continue to monitor 

## 2016-11-09 NOTE — Clinical Social Work Note (Signed)
CSW spoke with Hannah Keller, intake social worker at [facility]. Pt remains on the waiting list. Hannah Keller will fax the discharge summary from [facility] to ensure continuity of care. CSW left a voicemail with pt's guardian. CSW will continue to follow.   Dede Query, MSW, LCSW  Clinical Social Worker  [phone]

## 2016-11-09 NOTE — Clinical Social Work Note (Signed)
CSW spoke with pt's Guardian and provided update. He is in agreement with pt discharge plan to [facility]. Guardian has also given permission to speak with Maureen Ralphs, pt's group home administrator. CSW will continue to follow.   Dede Query, MSW, LCSW  Clincial Social Worker  [phone]

## 2016-11-09 NOTE — ED Notes (Signed)
Breakfast was given to patient. 

## 2016-11-09 NOTE — ED Notes (Signed)
Saltine crackers and milk was given to patient

## 2016-11-09 NOTE — ED Notes (Signed)
Patient in bathroom

## 2016-11-09 NOTE — ED Notes (Signed)
Sprite was given to patient 

## 2016-11-09 NOTE — ED Notes (Signed)
Paper with crayons  - crackers and PB provided

## 2016-11-09 NOTE — ED Notes (Signed)
IM injections administered as ordered   Pt then took her PO meds with applesauce    Sitting up on her bed with [employer] channel on TV  Continue to monitor closely

## 2016-11-10 MED ORDER — HALOPERIDOL LACTATE 5 MG/ML IJ SOLN
INTRAMUSCULAR | Status: AC
  Administered 2016-11-10: 10 mg via INTRAMUSCULAR
  Filled 2016-11-10: qty 2

## 2016-11-10 MED ORDER — LORAZEPAM 2 MG/ML IJ SOLN: 4.0000 mg | Freq: Once | INTRAMUSCULAR | Status: DC

## 2016-11-10 MED ORDER — HALOPERIDOL LACTATE 5 MG/ML IJ SOLN: 10.0000 mg | Freq: Once | INTRAMUSCULAR | Status: DC

## 2016-11-10 MED ORDER — DIPHENHYDRAMINE HCL 50 MG/ML IJ SOLN
50.0000 mg | Freq: Once | INTRAMUSCULAR | Status: AC
  Administered 2016-11-10: 50 mg via INTRAMUSCULAR

## 2016-11-10 MED ORDER — LORAZEPAM 2 MG/ML IJ SOLN
INTRAMUSCULAR | Status: AC
  Administered 2016-11-10: 2 mg via INTRAMUSCULAR
  Filled 2016-11-10: qty 2

## 2016-11-10 MED ORDER — DIPHENHYDRAMINE HCL 50 MG/ML IJ SOLN
INTRAMUSCULAR | Status: AC
  Administered 2016-11-10: 50 mg via INTRAMUSCULAR
  Filled 2016-11-10: qty 1

## 2016-11-10 MED ORDER — ONDANSETRON 4 MG PO TBDP
4.0000 mg | ORAL_TABLET | Freq: Once | ORAL | Status: AC
  Administered 2016-11-10: 4 mg via ORAL

## 2016-11-10 MED ORDER — CHLORPROMAZINE HCL 25 MG/ML IJ SOLN
100.0000 mg | Freq: Once | INTRAMUSCULAR | Status: AC
  Administered 2016-11-10: 100 mg via INTRAMUSCULAR
  Filled 2016-11-10: qty 4

## 2016-11-10 MED ORDER — CHLORPROMAZINE HCL 25 MG/ML IJ SOLN
100.0000 mg | Freq: Once | INTRAMUSCULAR | Status: DC
  Filled 2016-11-10: qty 4

## 2016-11-10 MED ORDER — ONDANSETRON 4 MG PO TBDP
ORAL_TABLET | ORAL | Status: AC
  Administered 2016-11-10: 4 mg via ORAL
  Filled 2016-11-10: qty 1

## 2016-11-10 NOTE — ED Notes (Signed)
Patient in bathroom

## 2016-11-10 NOTE — ED Notes (Signed)
Patient walked out of room. Patient stated, "I want to go home. I am sad". Verbal de escalation attempted. Patient attempting to leave quad area. Patient asked multiple times to return back to room. Patient agitated but has returned to room.

## 2016-11-10 NOTE — ED Notes (Signed)
PT taken to courtyard on lower level with two security officers and ED paramedic. Pt was taken in a wheelchair and will be in a completely enclosed courtyard. Pt in NAD at time of leaving ED.

## 2016-11-10 NOTE — ED Notes (Signed)
Crackers and milk was given to patient.

## 2016-11-10 NOTE — ED Notes (Signed)
PT began walking out of room again and reporting to staff that she needed

## 2016-11-10 NOTE — ED Notes (Signed)
Patient is asleep on the stretcher, rise and fall of chest noted; patient appears to be comfortable and does not appear to be in distress at this time. Sitter is present and rounding every 15 minutes to ensure safety. Law enforcement is present. No fluids or meals offered at this time. No toiletting offered at this time.  

## 2016-11-10 NOTE — ED Notes (Signed)
Unable to verbally de escalate patient at this time.

## 2016-11-10 NOTE — ED Notes (Signed)
Patient in room singing and dancing.   

## 2016-11-10 NOTE — ED Notes (Signed)
Patient resting with eye closed on stretcher with even and non labored respirations noted. Will continue to monitor.

## 2016-11-10 NOTE — Progress Notes (Signed)
William at [facility]- Called to inquire if bed is available, very close on list and information has been provided to ensure patient transition to [facility] is smooth.   [employer] LCSW [phone]
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
## 2016-11-10 NOTE — ED Notes (Signed)
MD notified of patients increased agitation. Verbal order for manual hold at this time.
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
## 2016-11-10 NOTE — ED Notes (Signed)
Lunch was given to patient 

## 2016-11-10 NOTE — ED Notes (Signed)
Patient visiting with her group home coordinator. Patient laughing and smiling.
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
## 2016-11-10 NOTE — ED Notes (Signed)
Verbal de-escalation attempted at this time. No success.

## 2016-11-10 NOTE — ED Notes (Signed)
Breakfast was given to patient. 

## 2016-11-10 NOTE — ED Notes (Signed)
Patient in shower 

## 2016-11-10 NOTE — ED Notes (Signed)
[facility]/ Pt remains the same on wait list

## 2016-11-10 NOTE — ED Notes (Signed)
Patient kicking door. MD aware.

## 2016-11-10 NOTE — ED Notes (Signed)
Pt has been medicated multiple times throughout the night for agitation and aggressive behavior. Pt has been attempting to leave ED and has become agitated when staff tells her this is not possible. Pt is currently medicated and refusing to sit in the bed. Pt stumbling when standing and is not safe to walk around but becomes agitated and aggressive toward staff when placed back in bed. Pt calms down immediately but then will attempt to leave again. Pt is a high fall risk at this time. Redirection, sitters, and redirection have not helped this time.

## 2016-11-10 NOTE — ED Notes (Signed)
Patient calm and cooperative at this time. Resting on stretcher, listening to music on tv. Even and non labored respirations noted. Will continue to monitor.

## 2016-11-10 NOTE — ED Notes (Signed)
Patient is ambulatory in room. Calm and cooperative.

## 2016-11-10 NOTE — ED Notes (Signed)
Manual hold released at this time. No signs of injury. Kathlene November, ED Medic, in room sitting with patient. Patient cooperative but remains agitated. Patient given sprite to drink.
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
## 2016-11-10 NOTE — ED Notes (Signed)
Patient calm and cooperative at this time. Patient given cup of applesauce.

## 2016-11-10 NOTE — ED Notes (Signed)
Dr.clapaac in room with patient 
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
## 2016-11-11 MED ORDER — DIPHENHYDRAMINE HCL 50 MG/ML IJ SOLN
50.0000 mg | Freq: Once | INTRAMUSCULAR | Status: DC
Start: 2016-11-11 — End: 2016-11-14
  Filled 2016-11-11: qty 1

## 2016-11-11 MED ORDER — HALOPERIDOL LACTATE 5 MG/ML IJ SOLN
5.0000 mg | Freq: Once | INTRAMUSCULAR | Status: DC
  Filled 2016-11-11: qty 1

## 2016-11-11 MED ORDER — ZIPRASIDONE MESYLATE 20 MG IM SOLR
20.0000 mg | Freq: Once | INTRAMUSCULAR | Status: AC
  Administered 2016-11-11: 20 mg via INTRAMUSCULAR

## 2016-11-11 MED ORDER — ZIPRASIDONE MESYLATE 20 MG IM SOLR
INTRAMUSCULAR | Status: AC
  Administered 2016-11-11: 20 mg via INTRAMUSCULAR
  Filled 2016-11-11: qty 20

## 2016-11-11 MED ORDER — LORAZEPAM 2 MG/ML IJ SOLN
2.0000 mg | Freq: Once | INTRAMUSCULAR | Status: DC
Start: 2016-11-11 — End: 2016-11-14
  Filled 2016-11-11 (×2): qty 1
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
## 2016-11-11 NOTE — ED Notes (Signed)
Patient becoming more restless and agitated.  Walking out of room frequently.  Refusing to return to room, sitter with patient.  Patient continues to refuse to return to room, despite repeated requests.  Patient's agitation increased, attempting to kick, punch, bite, and spit at staff.  Patient returned to stretcher, Ativan given as per [facility].  Dr. Elenora Gamma notifed and to bedside to evaluate patient.

## 2016-11-11 NOTE — ED Notes (Signed)
The patient was given a lunch tray to eat with diet cola to drink. Sitter still with the patient in her room.

## 2016-11-11 NOTE — ED Notes (Signed)
BEHAVIORAL HEALTH ROUNDING Patient sleeping: Yes.   Patient alert and oriented: not applicable SLEEPING Behavior appropriate: Yes.  ; If no, describe: SLEEPING Nutrition and fluids offered: No SLEEPING Toileting and hygiene offered: NoSLEEPING Sitter present: yes, Q 15 min safety rounds and observation. Law enforcement present: Yes ODS 

## 2016-11-11 NOTE — ED Notes (Signed)
ENVIRONMENTAL ASSESSMENT  Potentially harmful objects out of patient reach: Yes.  Personal belongings secured: Yes.  Patient dressed in hospital provided attire only: Yes.  Plastic bags out of patient reach: Yes.  Patient care equipment (cords, cables, call bells, lines, and drains) shortened, removed, or accounted for: Yes.  Equipment and supplies removed from bottom of stretcher: Yes.  Potentially toxic materials out of patient reach: Yes.  Sharps container removed or out of patient reach: Yes.   BEHAVIORAL HEALTH ROUNDING  Patient sleeping: No.  Patient alert and oriented: yes  Behavior appropriate: Yes. ; If no, describe:  Nutrition and fluids offered: Yes  Toileting and hygiene offered: Yes  Sitter present: yes, Q 15 min safety rounds and observation.  Law enforcement present: Yes ODS  Pt laying on the bed and calm at this time. This RN and Erskine Squibb, RN into room to talk with pt about remaining calm. Pt agrees to stay calm at this time. Sitter (Beth, EDT) remains at bedside.

## 2016-11-11 NOTE — Progress Notes (Signed)
LCSW called [facility] and was informed by [facility] there are no beds today but will call me as this patient is on a short list.  Ermalinda Joubert LCSW [phone]

## 2016-11-11 NOTE — ED Provider Notes (Signed)
-----------------------------------------   6:41 AM on 11/11/2016 -----------------------------------------   Blood pressure 117/65, pulse (!) 102, temperature 97.9 F (36.6 C), temperature source Oral, resp. rate 20, height  (1.473 m), weight 160 lb (72.6 kg), last menstrual period 10/08/2016, SpO2 98 %.  The patient had no acute events since last update.  Calm and cooperative at this time.  Disposition is pending Psychiatry/Behavioral Medicine/CSW team recommendations.     Irean Hong, MD 11/11/16 [phone]

## 2016-11-11 NOTE — ED Notes (Signed)
Pt to toilet but urinated in floor, pt unsteady and unable to ambulate on her own, changed pt's clothes and bedding, soft restraints reapplied to new bedding

## 2016-11-11 NOTE — ED Provider Notes (Signed)
-----------------------------------------   7:06 PM on 11/11/2016 -----------------------------------------  The patient is once again become agitated and attempting to bite and spit at staff members. Attempted redirection multiple times without success. We will dose 20 mg of Geodon intramuscularly.   Minna Antis, MD 11/11/16 [phone]

## 2016-11-11 NOTE — ED Notes (Signed)
Pt advised that she was still hungry and did not want to stay in her room. Pt advised that she wanted some cereal. Due to tech in the room with pt she was allowed to eat cereal with a spoon and chocolate milk. Nurse advised.

## 2016-11-11 NOTE — ED Notes (Signed)
Pt 1:1 safety sitter removed att, pt sleeping soundly

## 2016-11-11 NOTE — ED Notes (Signed)
Patient showering

## 2016-11-12 MED ORDER — CHLORPROMAZINE HCL 50 MG PO TABS
50.0000 mg | ORAL_TABLET | Freq: Once | ORAL | Status: AC
  Administered 2016-11-12: 50 mg via ORAL
  Filled 2016-11-12: qty 1

## 2016-11-12 NOTE — ED Provider Notes (Signed)
-----------------------------------------   9:40 AM on 11/12/2016 -----------------------------------------   Blood pressure 121/75, pulse (!) 101, temperature 98.3 F (36.8 C), temperature source Oral, resp. rate 18, height  (1.473 m), weight 160 lb (72.6 kg), last menstrual period 10/08/2016, SpO2 95 %.  The patient had no acute events since last update.  Calm and cooperative at this time.  Disposition is pending Psychiatry/Behavioral Medicine team recommendations.     Sharman Cheek, MD 11/12/16 [phone]

## 2016-11-12 NOTE — ED Notes (Signed)
Pt noted to be incont of urine. Pt bed changed and pt assisted with changing her scrubs. Pt remains calm and cooperative.

## 2016-11-12 NOTE — ED Notes (Signed)
BEHAVIORAL HEALTH ROUNDING Patient sleeping: Yes.   Patient alert and oriented: not applicable SLEEPING Behavior appropriate: Yes.  ; If no, describe: SLEEPING Nutrition and fluids offered: No SLEEPING Toileting and hygiene offered: NoSLEEPING Sitter present: yes, Q 15 min safety rounds and observation. Law enforcement present: Yes ODS 

## 2016-11-12 NOTE — ED Notes (Signed)
BEHAVIORAL HEALTH ROUNDING Patient sleeping: Yes.   Patient alert and oriented: not applicable SLEEPING Behavior appropriate: Yes.  ; If no, describe: SLEEPING Nutrition and fluids offered: No SLEEPING Toileting and hygiene offered: NoSLEEPING Sitter present: 1:1 sitter at bedside, Q 15 min safety rounds and observation. Law enforcement present: Yes ODS

## 2016-11-12 NOTE — ED Notes (Signed)
Pt IVC/ Pending Placement 

## 2016-11-12 NOTE — ED Notes (Addendum)
BEHAVIORAL HEALTH ROUNDING Patient sleeping: Yes.   Patient alert and oriented: not applicable SLEEPING Behavior appropriate: Yes.  ; If no, describe: SLEEPING Nutrition and fluids offered: No SLEEPING Toileting and hygiene offered: NoSLEEPING Sitter present: not applicable, Q 15 min safety rounds and observation. Law enforcement present: Yes ODS  ENVIRONMENTAL ASSESSMENT  Potentially harmful objects out of patient reach: Yes.  Personal belongings secured: Yes.  Patient dressed in hospital provided attire only: Yes.  Plastic bags out of patient reach: Yes.  Patient care equipment (cords, cables, call bells, lines, and drains) shortened, removed, or accounted for: Yes.  Equipment and supplies removed from bottom of stretcher: Yes.  Potentially toxic materials out of patient reach: Yes.  Sharps container removed or out of patient reach: Yes.   

## 2016-11-12 NOTE — ED Notes (Signed)
Pt on phone with mom

## 2016-11-12 NOTE — Progress Notes (Signed)
LCSW consulted with ED RN and patient had a great night. LCSW called [facility] and patient remains on [facility] priority wait list as per [facility] in admissions there are no current beds.   [employer] LCSW  [phone]

## 2016-11-12 NOTE — ED Notes (Signed)
Visitor Silvio Pate) with pt. Visitor told pt that Maureen Ralphs would visit tomorrow and they were trying to get her back home.

## 2016-11-12 NOTE — ED Notes (Signed)
Pt given breakfast tray with sprite 

## 2016-11-12 NOTE — ED Notes (Signed)
Pt given graham crackers and milk. Pt informed after this there will not be anything else until lunch time.

## 2016-11-12 NOTE — ED Notes (Signed)
BEHAVIORAL HEALTH ROUNDING Patient sleeping: Yes.   Patient alert and oriented: not applicable SLEEPING Behavior appropriate: Yes.  ; If no, describe: SLEEPING Nutrition and fluids offered: No SLEEPING Toileting and hygiene offered: NoSLEEPING Sitter present: not applicable, Q 15 min safety rounds and observation. Law enforcement present: Yes ODS 

## 2016-11-12 NOTE — ED Notes (Signed)
Pt linens changed, trash removed from room and floor mopped. Checked under mattress for any items. Nothing other than paper found.

## 2016-11-12 NOTE — ED Notes (Signed)
BEHAVIORAL HEALTH ROUNDING  Patient sleeping: No.  Patient alert and oriented: yes  Behavior appropriate: Yes. ; If no, describe:  Nutrition and fluids offered: Yes  Toileting and hygiene offered: Yes  Sitter present: yes, Q 15 min safety rounds and observation.  Law enforcement present: Yes ODS

## 2016-11-12 NOTE — ED Notes (Signed)
Pt awake and up to bathroom. Pt calm and cooperative at this time. Spoke with pt about having a good day and being cooperative today. Pt states she will be on good behavior today.

## 2016-11-12 NOTE — ED Notes (Signed)
Pt is taking a shower.

## 2016-11-13 MED ORDER — HALOPERIDOL LACTATE 5 MG/ML IJ SOLN
INTRAMUSCULAR | Status: AC
  Administered 2016-11-13: 5 mg via INTRAMUSCULAR
  Filled 2016-11-13: qty 1

## 2016-11-13 MED ORDER — LORAZEPAM 2 MG/ML IJ SOLN
INTRAMUSCULAR | Status: AC
  Administered 2016-11-13: 2 mg via INTRAMUSCULAR
  Filled 2016-11-13: qty 1

## 2016-11-13 MED ORDER — LORAZEPAM 2 MG/ML IJ SOLN
2.0000 mg | Freq: Once | INTRAMUSCULAR | Status: AC
  Administered 2016-11-13: 2 mg via INTRAMUSCULAR

## 2016-11-13 MED ORDER — HALOPERIDOL LACTATE 5 MG/ML IJ SOLN
5.0000 mg | Freq: Once | INTRAMUSCULAR | Status: AC
  Administered 2016-11-13: 5 mg via INTRAMUSCULAR

## 2016-11-13 MED ORDER — ACETAMINOPHEN 325 MG PO TABS
650.0000 mg | ORAL_TABLET | Freq: Once | ORAL | Status: AC
  Administered 2016-11-13: 650 mg via ORAL

## 2016-11-13 MED ORDER — HALOPERIDOL LACTATE 5 MG/ML IJ SOLN
10.0000 mg | Freq: Once | INTRAMUSCULAR | Status: AC
  Administered 2016-11-13: 10 mg via INTRAMUSCULAR

## 2016-11-13 MED ORDER — HALOPERIDOL LACTATE 5 MG/ML IJ SOLN
INTRAMUSCULAR | Status: AC
  Administered 2016-11-13: 10 mg via INTRAMUSCULAR
  Filled 2016-11-13: qty 2

## 2016-11-13 MED ORDER — ACETAMINOPHEN 325 MG PO TABS
ORAL_TABLET | ORAL | Status: AC
  Administered 2016-11-13: 650 mg via ORAL
  Filled 2016-11-13: qty 2

## 2016-11-13 NOTE — ED Notes (Signed)
Gave pt graham crackers and peanut butter. Gave pt a sprite.

## 2016-11-13 NOTE — Progress Notes (Signed)
   11/10/16 1342 11/10/16 1343 11/10/16 2106  Vitals  Temp --  --  (Attempting to allow for pt to calm down. decreased stimuli)  BP 127/74 --  --   BP Location --  --  --   BP Method --  --  --   Patient Position (if appropriate) --  --  --   Pulse Rate (!) 115 --  --   Pulse Rate Source --  --  --   Resp 19 --  --   Oxygen Therapy  SpO2 100 % --  --   O2 Device Room Air --  --   Pain Assessment  Pain Assessment --  No/denies pain --   Pain Score --  --  --   Faces Pain Scale --  --  --   Pain Type --  --  --      11/10/16 2200 11/10/16 2350 11/11/16 1831  Vitals  Temp --  --  --   BP 117/65 --  112/75  BP Location Right Arm --  Left Arm  BP Method Automatic --  Automatic  Patient Position (if appropriate) Lying --  Standing  Pulse Rate (!) 118 (!) 102 (!) 123  Pulse Rate Source Dinamap Monitor Dinamap  Resp --  20 20  Oxygen Therapy  SpO2 97 % 98 % 99 %  O2 Device Room Air Room Air Room Air  Pain Assessment  Pain Assessment Faces 0-10 --   Pain Score --  Asleep --   Faces Pain Scale 2 --  --   Pain Type Acute pain --  --

## 2016-11-13 NOTE — ED Notes (Signed)
Pt was standing in hall stating "I just want to go home", and refusing to go into room this tech redirected pt and asked pt if she would like graham crackers and chocolate milk ; pt agreed and pt went into to room and is enjoying her snack.
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
## 2016-11-13 NOTE — ED Notes (Signed)
This RN placed lavender essential oil on pt teddy bear. Pt was informed that perfume was placed on teddy bear. Pt agreed to let this RN placed lavender on teddy bear.

## 2016-11-13 NOTE — ED Notes (Signed)
BEHAVIORAL HEALTH ROUNDING  Patient sleeping: No.  Patient alert and oriented: yes  Behavior appropriate: Yes. ; If no, describe:  Nutrition and fluids offered: Yes  Toileting and hygiene offered: Yes  Sitter present: not applicable, Q 15 min safety rounds and observation.  Law enforcement present: Yes ODS  

## 2016-11-13 NOTE — ED Notes (Signed)
BEHAVIORAL HEALTH ROUNDING Patient sleeping: Yes.   Patient alert and oriented: not applicable SLEEPING Behavior appropriate: Yes.  ; If no, describe: SLEEPING Nutrition and fluids offered: No SLEEPING Toileting and hygiene offered: NoSLEEPING Sitter present: not applicable, Q 15 min safety rounds and observation. Law enforcement present: Yes ODS 
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
## 2016-11-13 NOTE — ED Provider Notes (Signed)
Patient became agitated yelling and attempting to bite staff. She was to be talked in the laying down on the bed and then tried to get up again had to be restrained by holding her arms and legs until she was given Ativan and Haldol IM. She then calmed down rapidly and stop trying to bite and scratch and Kick.    Arnaldo Natal, MD 11/13/16 1900

## 2016-11-13 NOTE — ED Notes (Signed)
Gave meal tray
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
## 2016-11-13 NOTE — Clinical Social Work Note (Signed)
[facility] Admissions Rep: Vivien Rossetti called CSW to ask for any legal paperwork, a new diversion, MARS X5 days, vital signs X 5 days, MD note X2 days and Behavior Notes for the past 24 hours. CSW will work on getting all of this together and faxing to [phone]. York Spaniel MSW,LCSW [phone]

## 2016-11-13 NOTE — ED Notes (Signed)
Pt is sleeping at this time, equal rise and fall of chest is noted, pt in NAD at this time.

## 2016-11-13 NOTE — ED Notes (Signed)
Gave pt two blankets. 

## 2016-11-13 NOTE — ED Notes (Signed)
PT IVC/ PENDING PLACEMENT  

## 2016-11-13 NOTE — Clinical Social Work Note (Addendum)
CSW has sent the diversion/exception form to [employer] for signature and then they will fax it to [facility]. CSW has provided Britta Mccreedy at [facility] with updated information that covers the past 5 days per her request. Will await [facility] disposition. York Spaniel MSW,LCSW [phone]

## 2016-11-13 NOTE — Progress Notes (Signed)
   11/11/16 2123 11/12/16 0641 11/12/16 1531  Vitals  Temp 99.8 F (37.7 C) 98.3 F (36.8 C) --   Temp Source Oral Oral --   BP 124/66 121/75 114/71  BP Location Left Arm Left Arm Right Arm  BP Method Automatic Automatic Automatic  Patient Position (if appropriate) Lying Lying Lying  Pulse Rate (!) 108 (!) 101 98  Pulse Rate Source Dinamap Dinamap Dinamap  Resp Oxygen Therapy  SpO2 --  95 % 98 %  O2 Device --  Room Air Room Air     11/12/16 2208 11/13/16 0612  Vitals  Temp 98.5 F (36.9 C) 99.5 F (37.5 C)  Temp Source Tympanic Oral  BP 101/84 116/65  BP Location Left Arm Right Arm  BP Method Automatic Automatic  Patient Position (if appropriate) Sitting Sitting  Pulse Rate (!) 114 (!) 111  Pulse Rate Source --  --   Resp 17 18  Oxygen Therapy  SpO2 99 % --   O2 Device Room Air --

## 2016-11-13 NOTE — ED Provider Notes (Signed)
Cor became agitated and was unable to be verbally redirected back into her room. Multiple attempts to try to get her to redirect were unsuccessful. I ordered an injection of Haldol and Ativan to address agitation and safety for self and staff.   Governor Rooks, MD 11/13/16 1240

## 2016-11-13 NOTE — ED Notes (Signed)
Pt resting peacefully in bed appears no distress and looks comfortable

## 2016-11-13 NOTE — Progress Notes (Signed)
   11/08/16 2210 11/09/16 0950 11/09/16 1454  Vitals  Temp 98.8 F (37.1 C) 98.3 F (36.8 C) 98.1 F (36.7 C)  Temp Source Oral --  Oral  BP 130/68 130/68 122/68  BP Location Right Arm --  Right Arm  BP Method Automatic --  Automatic  Patient Position (if appropriate) Lying --  Lying  Pulse Rate (!) 108 99 97  Pulse Rate Source Dinamap --  Dinamap  Resp Oxygen Therapy  SpO2 98 % --  100 %  O2 Device Room Air --  Room Air  Pain Assessment  Pain Assessment --  --  No/denies pain     11/09/16 2122 11/10/16 0557 11/10/16 0710  Vitals  Temp 98.1 F (36.7 C) 97.9 F (36.6 C) --   Temp Source Oral Oral --   BP (!) 106/56 121/62 --   BP Location Right Arm Right Arm --   BP Method Automatic Automatic --   Patient Position (if appropriate) Lying Sitting --   Pulse Rate (!) 102 95 --   Pulse Rate Source Dinamap Dinamap --   Resp 18 18 --   Oxygen Therapy  SpO2 99 % 100 % --   O2 Device Room Air Room Air --   Pain Assessment  Pain Assessment --  --  No/denies pain

## 2016-11-13 NOTE — ED Notes (Signed)
Pt with sitter at bedside calm at this time.

## 2016-11-13 NOTE — ED Provider Notes (Signed)
-----------------------------------------   6:41 AM on 11/13/2016 -----------------------------------------   Blood pressure 116/65, pulse (!) 111, temperature 99.5 F (37.5 C), temperature source Oral, resp. rate 18, height  (1.473 m), weight 160 lb (72.6 kg), last menstrual period 10/08/2016, SpO2 99 %.  The patient had no acute events since last update.  Calm and cooperative at this time.  Disposition is pending Psychiatry/Behavioral Medicine team recommendations.     Irean Hong, MD 11/13/16 [phone]

## 2016-11-14 NOTE — Clinical Social Work Note (Addendum)
CSW received message to call intake at [facility]. CSW called and spoke with Marliss Czar, the admission nurse for today. Patient has been cleared to discharge to [facility]. Nurse will need to call report to: [phone] then call the sheriff's office to transport her. CSW has updated the ED AD: Heather this morning regarding the above. York Spaniel MSW,LCSW [phone]

## 2016-11-14 NOTE — ED Notes (Signed)
ED BHU PLACEMENT JUSTIFICATION Is the patient under IVC or is there intent for IVC: Yes.   Is the patient medically cleared: Yes.   Is there vacancy in the ED BHU: Yes.   Is the population mix appropriate for patient: Yes.   Is the patient awaiting placement in inpatient or outpatient setting: Yes.   Has the patient had a psychiatric consult: Yes.   Survey of unit performed for contraband, proper placement and condition of furniture, tampering with fixtures in bathroom, shower, and each patient room: Yes.  ; Findings:  APPEARANCE/BEHAVIOR Calm and cooperative NEURO ASSESSMENT Orientation: oriented to self place and situation   Denies pain Hallucinations: No.None noted (Hallucinations) Speech: Normal Gait: normal RESPIRATORY ASSESSMENT Even  Unlabored respirations  CARDIOVASCULAR ASSESSMENT Pulses equal   regular rate  Skin warm and dry   GASTROINTESTINAL ASSESSMENT no GI complaint EXTREMITIES Full ROM  PLAN OF CARE Provide calm/safe environment. Vital signs assessed twice daily. ED BHU Assessment once each 12-hour shift. Collaborate with TTS  daily or as condition indicates. Assure the ED provider has rounded once each shift. Provide and encourage hygiene. Provide redirection as needed. Assess for escalating behavior; address immediately and inform ED provider.  Assess family dynamic and appropriateness for visitation as needed: Yes.  ; If necessary, describe findings:  Educate the patient/family about BHU procedures/visitation: Yes.  ; If necessary, describe findings:

## 2016-11-14 NOTE — ED Notes (Signed)
Pt sleeping at this time, rise and fall of chest noted, snoring can be heard.

## 2016-11-14 NOTE — ED Notes (Signed)
Pt currently sleeping on left side, arms visible, audible snoring can be heard from pt.

## 2016-11-14 NOTE — ED Notes (Signed)
Called [facility] [phone]

## 2016-11-14 NOTE — ED Notes (Signed)
Pt ambulated to BR without difficulty. Pt back in rm, warm blanket provided to pt.

## 2016-11-14 NOTE — ED Notes (Signed)
Patient observed lying in bed with eyes closed  Even, unlabored respirations observed   NAD pt appears to be sleeping  I will continue to monitor along with every 15 minute visual observations and ongoing security monitoring    

## 2016-11-14 NOTE — ED Notes (Signed)

## 2016-11-14 NOTE — Clinical Social Work Note (Signed)
CSW received call from [employer] at [facility] stating that they had received, approved, and faxed back the diversion/exception form to [facility] Awaiting [facility] disposition. York Spaniel MSW,LcSW [phone]

## 2016-11-14 NOTE — ED Notes (Signed)
Am meds administered as ordered   Assessment completed  Pt informed of her pending transfer to [facility] - She appears happy  Continue to monitor

## 2016-11-14 NOTE — ED Notes (Signed)
BEHAVIORAL HEALTH ROUNDING Patient sleeping: No. Patient alert and oriented: yes  Alert  - oriented to self place situation  Behavior appropriate: Yes.  ; If no, describe:  Nutrition and fluids offered: yes Toileting and hygiene offered: Yes  Sitter present: q15 minute observations and security monitoring Law enforcement present: Yes  ODS

## 2016-11-14 NOTE — ED Notes (Signed)
She is transporting via [facility] to [facility] at this time  I called [facility] to let them know she should arrive in approx 1 hour

## 2016-11-14 NOTE — ED Notes (Signed)
Pt given breakfast tray and and 5 packs of crackers, peanut butter, chocolate milk

## 2016-11-14 NOTE — ED Provider Notes (Signed)
-----------------------------------------   6:31 AM on 11/14/2016 -----------------------------------------   Blood pressure (!) 133/98, pulse (!) 122, temperature 98.1 F (36.7 C), temperature source Oral, resp. rate (!) 24, height  (1.473 m), weight 160 lb (72.6 kg), last menstrual period 10/08/2016, SpO2 99 %.  The patient had no acute events since last update.  Calm and cooperative at this time.  The patient had no events overnight. She is currently on the [facility] priority waiting list.     Rebecka Apley, MD 11/14/16 [phone]

## 2017-01-15 IMAGING — RF DG SWALLOWING FUNCTION - NRPT [facility]
13 of 24 series · 13 of 24 positions shown · non-contrast
Comparison: none

[Series 1: run · 1 of 28 frames shown (1 of 13)]
[frame 5/28]
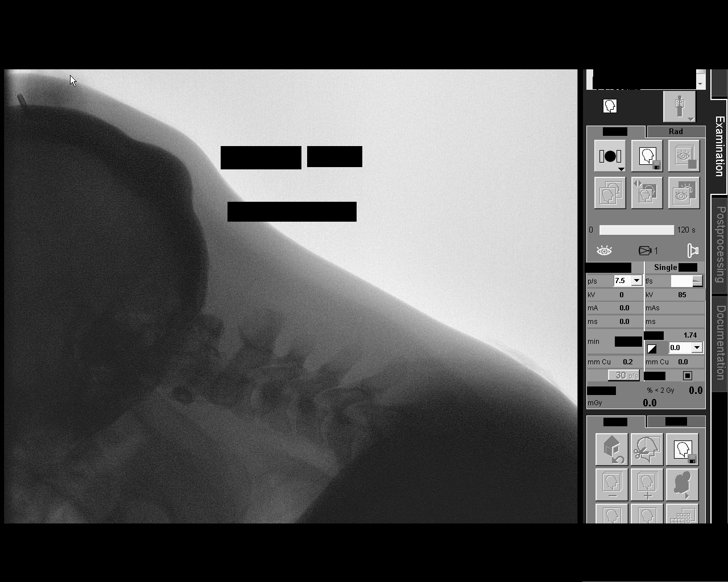

[Series 3: run · 1 of 75 frames shown (2 of 13)]
[frame 12/75]
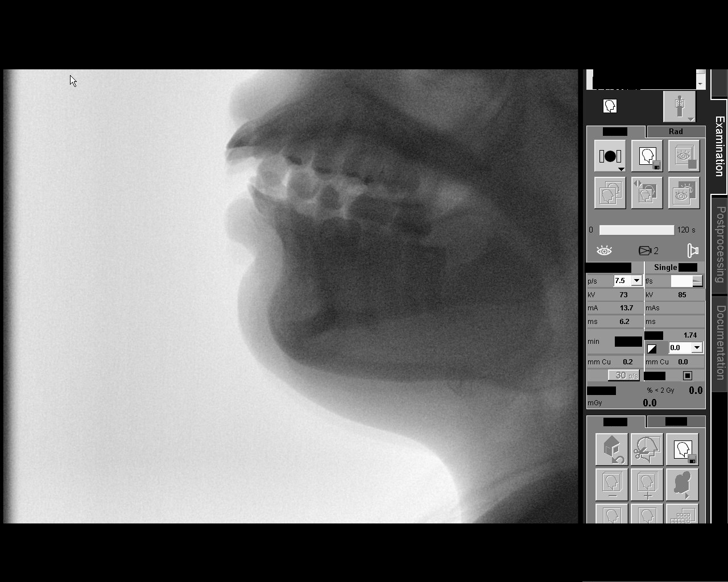

[Series 5: run · 1 of 14 frames shown (3 of 13)]
[frame 1/14]
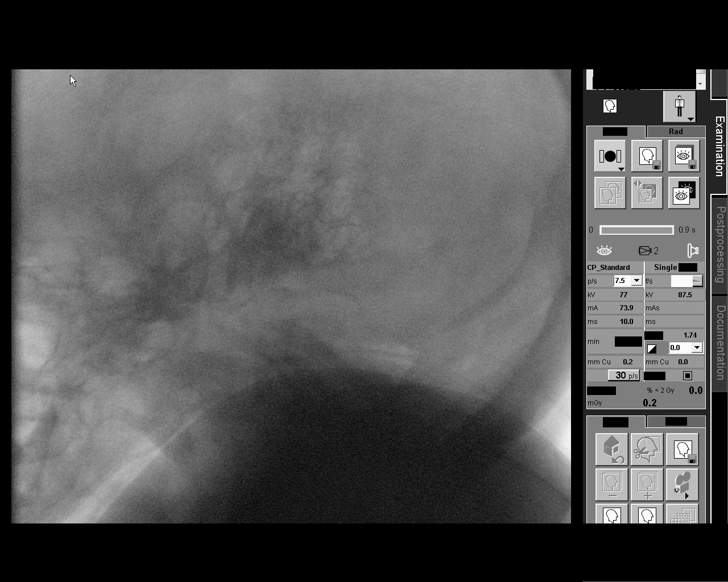

[Series 7: run · 1 of 429 frames shown (4 of 13)]
[frame 215/429]
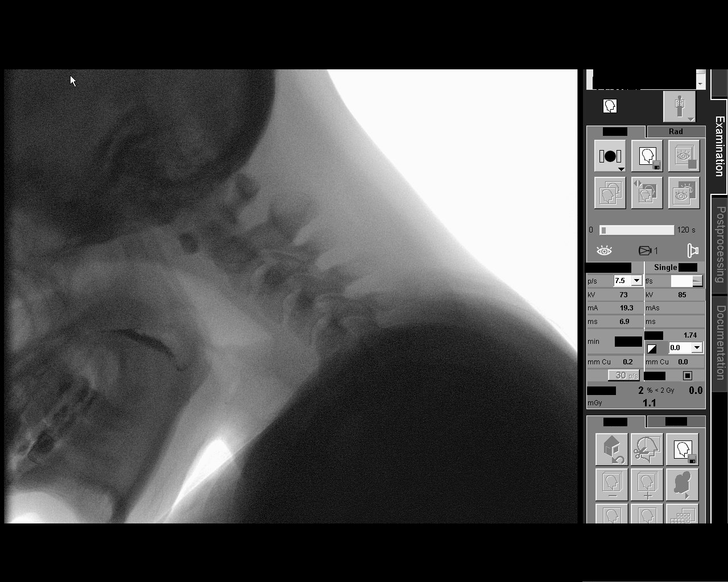

[Series 9: run · 1 of 78 frames shown (5 of 13)]
[frame 40/78]
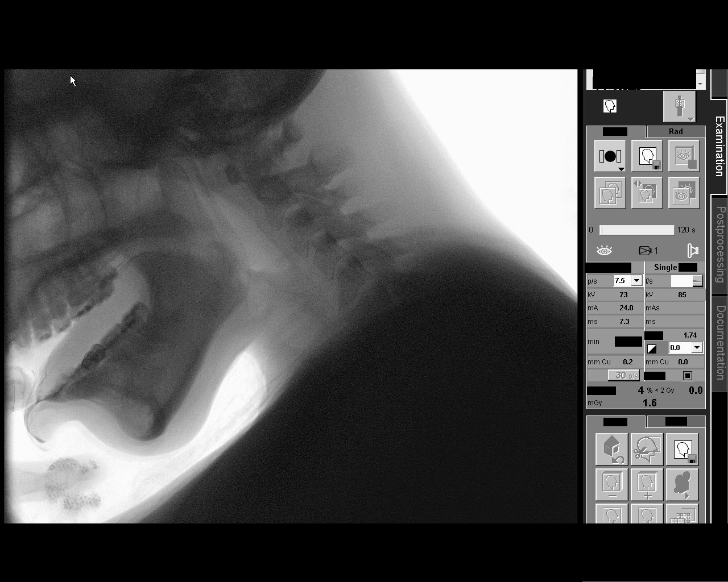

[Series 11: run · 1 of 107 frames shown (6 of 13)]
[frame 54/107]
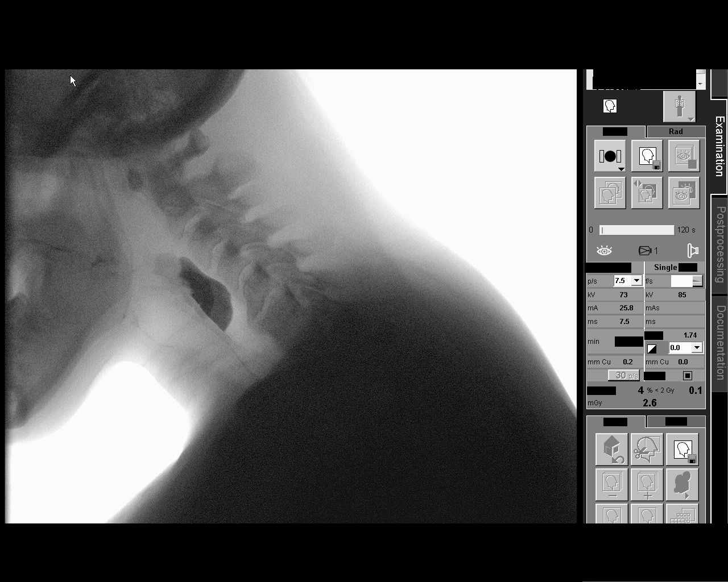

[Series 13: run · 1 of 383 frames shown (7 of 13)]
[frame 192/383]
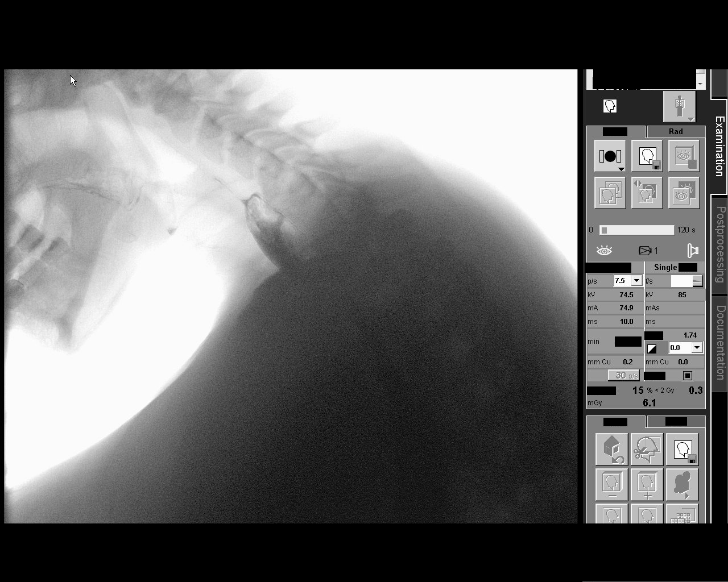

[Series 16: run · 1 of 19 frames shown (8 of 13)]
[frame 10/19]
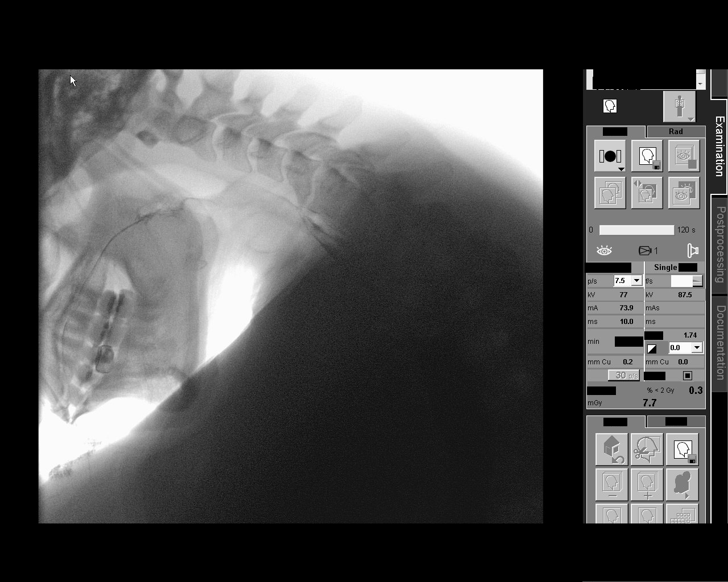

[Series 18: run · 1 of 102 frames shown (9 of 13)]
[frame 16/102]
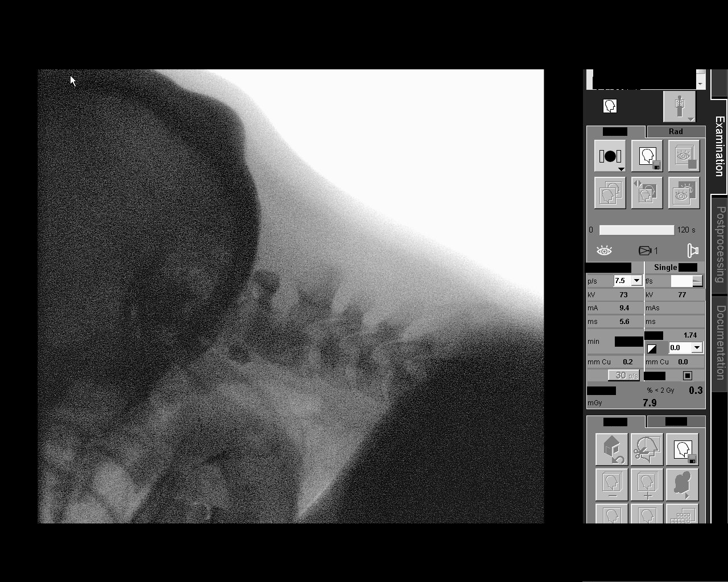

[Series 20: run · 1 of 11 frames shown (10 of 13)]
[frame 2/11]
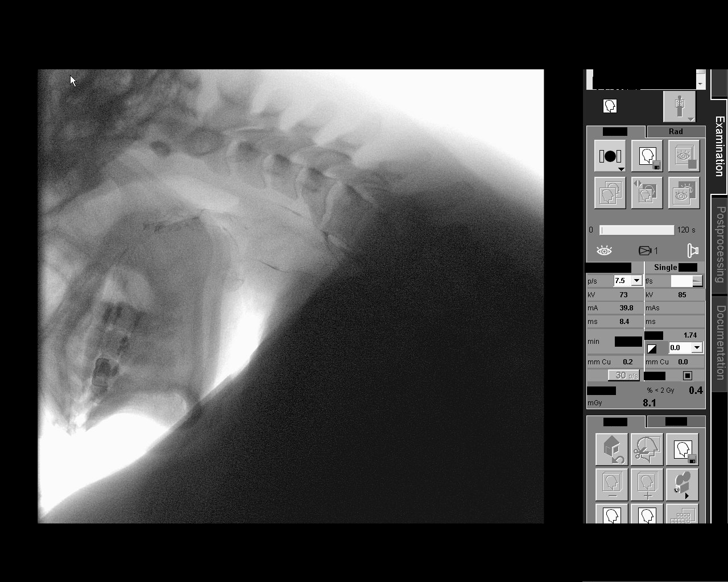

[Series 22: run · 1 of 252 frames shown (11 of 13)]
[frame 153/252]
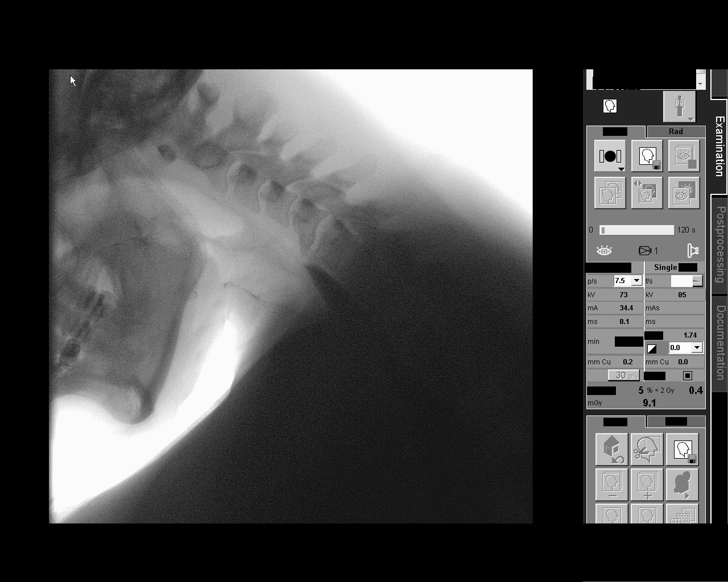

[Series 24: run · 1 of 50 frames shown (12 of 13)]
[frame 47/50]
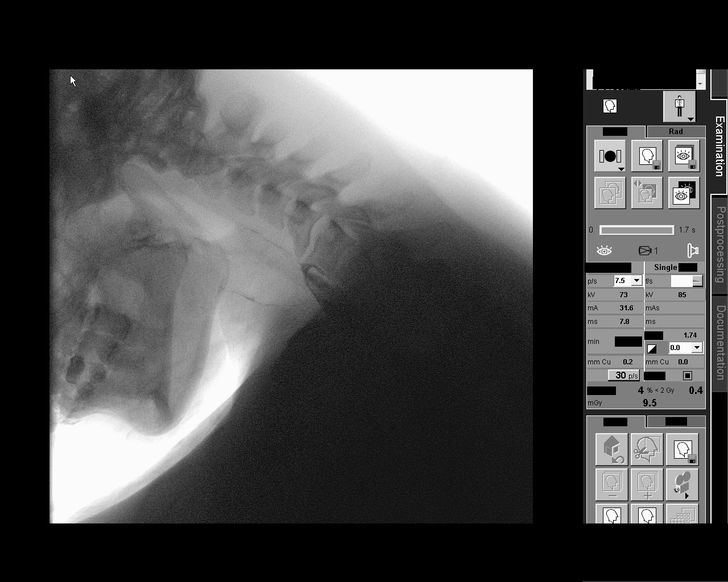

[Series 26: run · 1 of 413 frames shown (13 of 13)]
[frame 352/413]
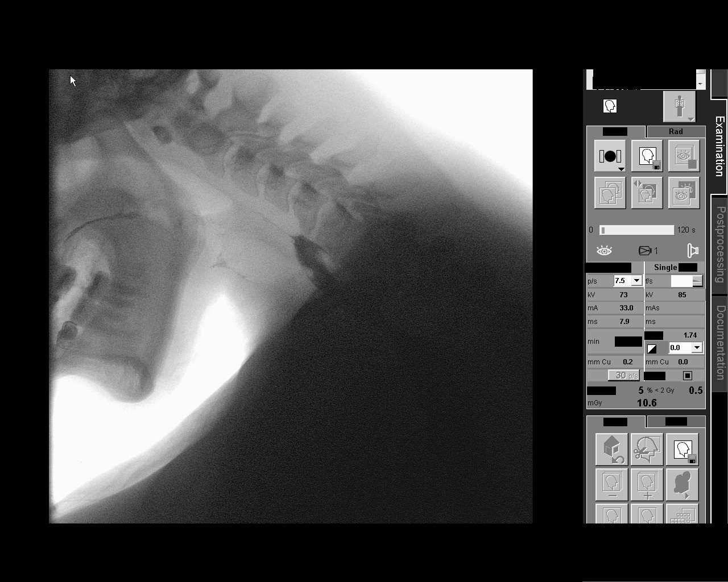

[13 of 24 positions shown; findings below may reference images not displayed]

FLUOROSCOPY FOR SWALLOWING FUNCTION STUDY:
Fluoroscopy was provided for swallowing function study, which was administered by a speech pathologist.  Final results and recommendations from this study are contained within the speech pathology report.

## 2018-04-07 IMAGING — CR DG CHEST 2V
2 series · 2 of 2 positions shown · non-contrast
Comparison: April 03, 2015

CLINICAL DATA: Low-grade fever and tachycardia.

EXAM:
CHEST  2 VIEW

[chest pa]
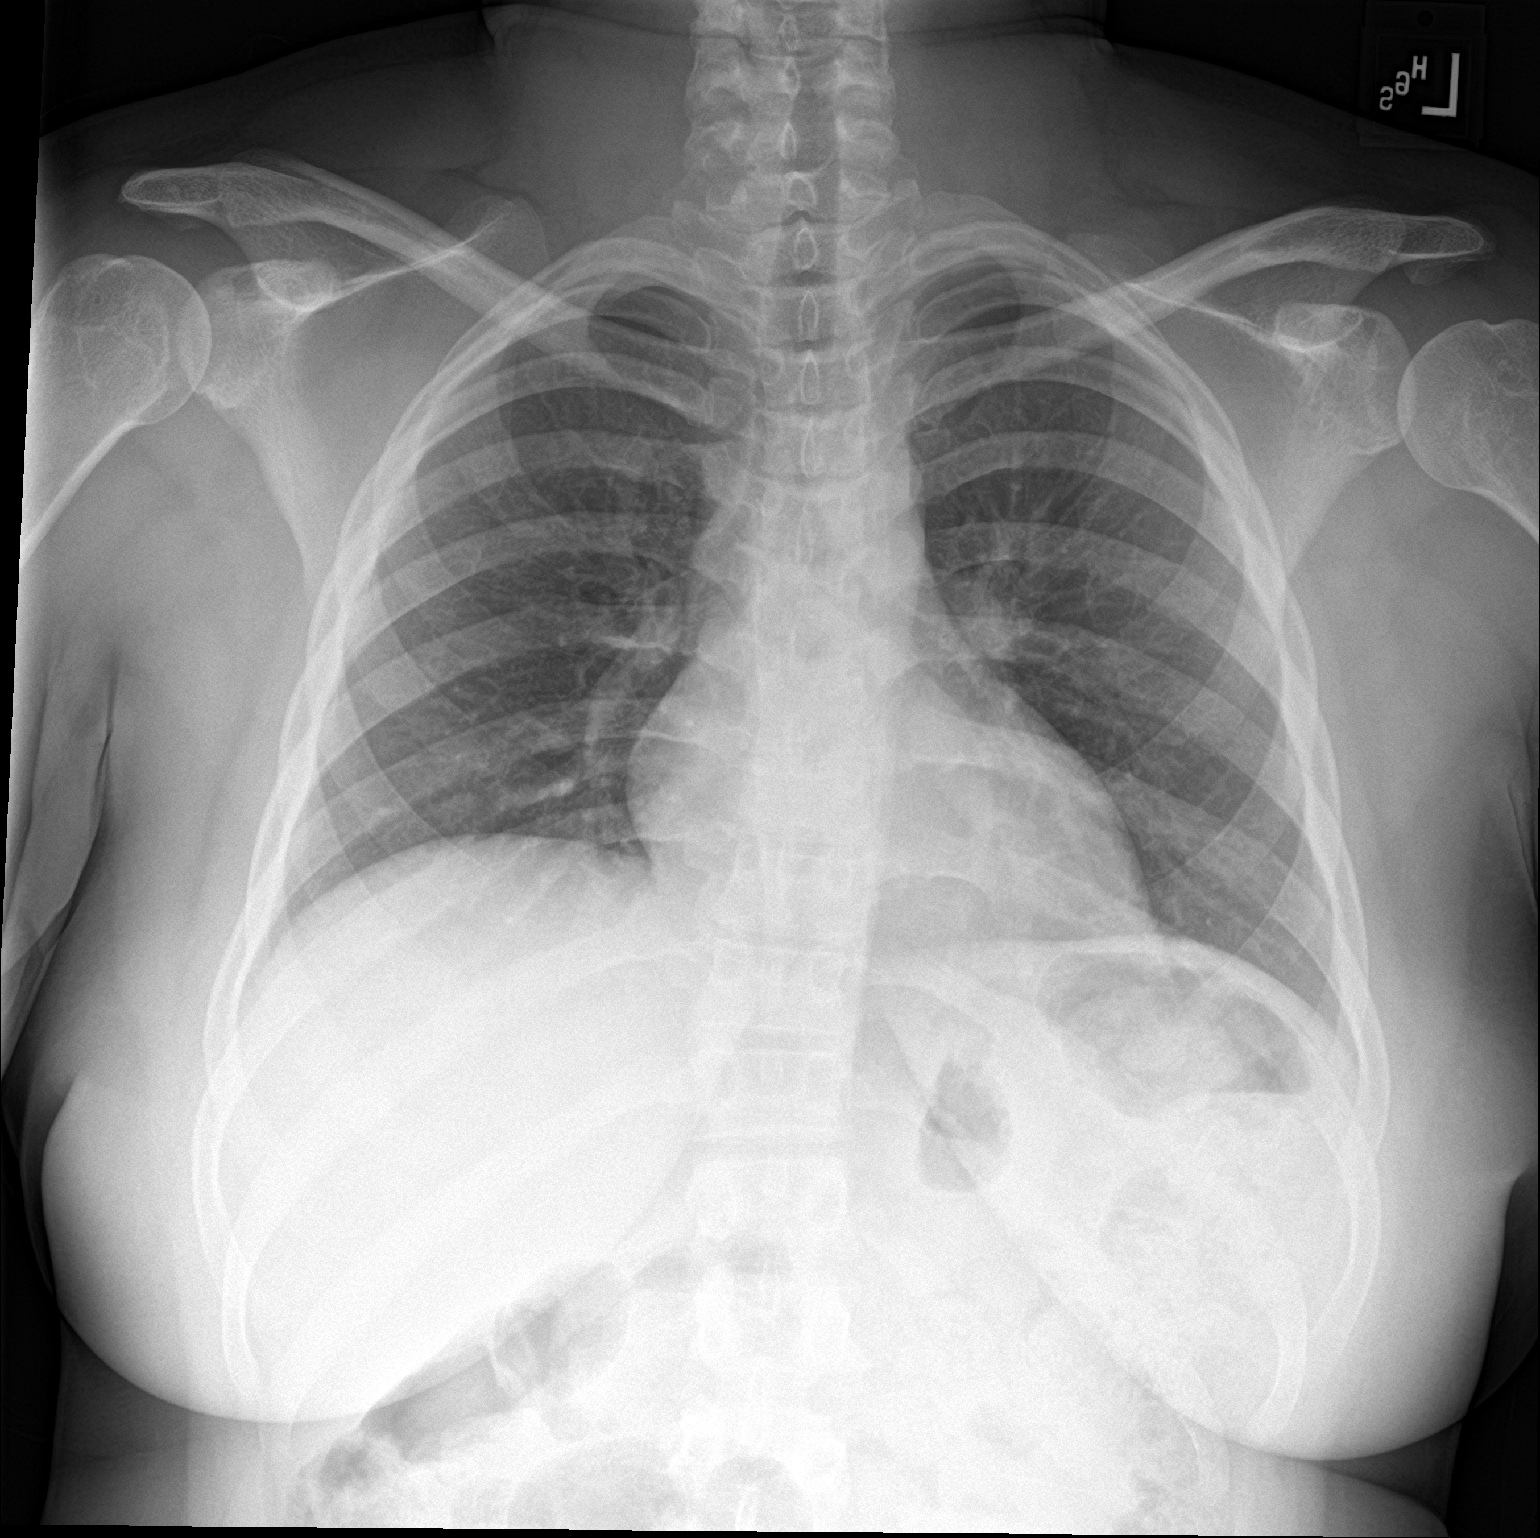

[chest lat]
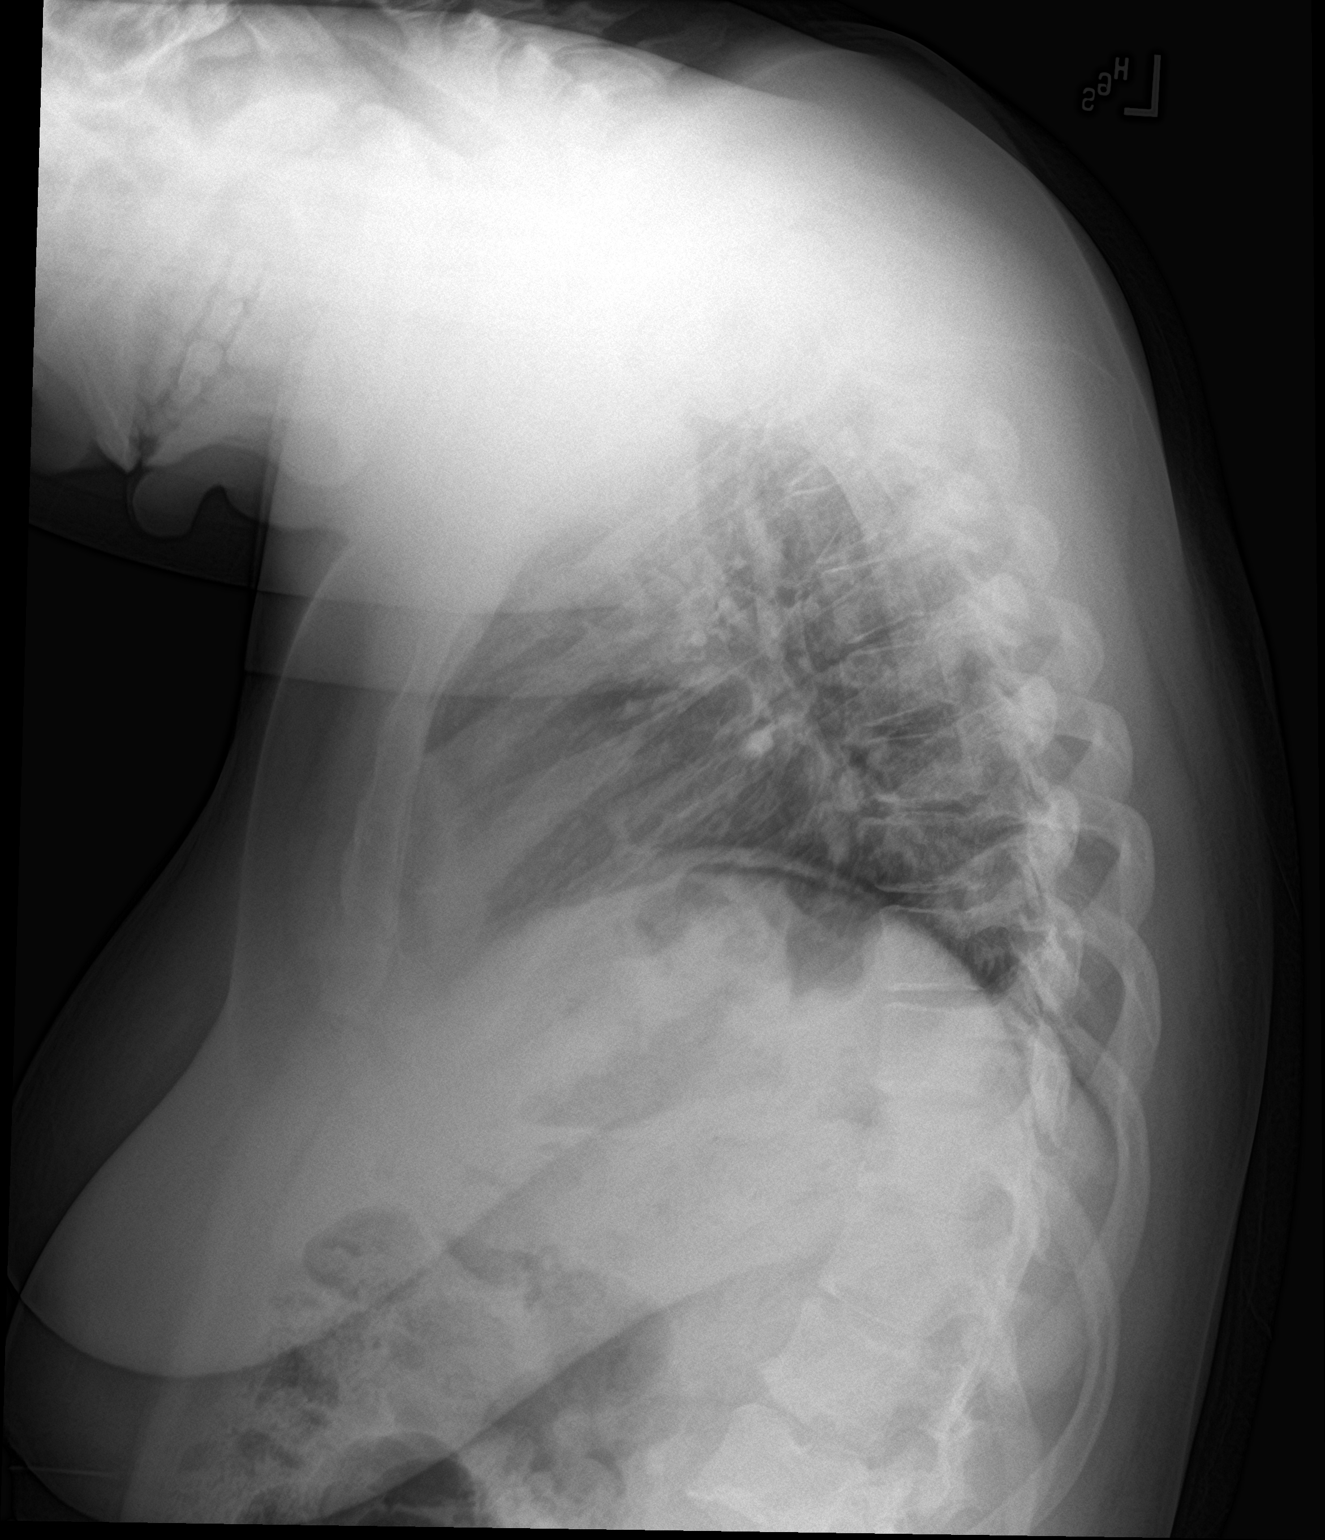

[2 of 2 positions shown; findings below may reference images not displayed]

FINDINGS: The heart size and mediastinal contours are within normal limits.
Both lungs are clear. The visualized skeletal structures are
unremarkable.
IMPRESSION: No active cardiopulmonary disease.
